# Patient Record
Sex: Female | Born: 1945
Health system: Southern US, Community
[De-identification: ages and names within clinical notes are randomized; demographics above are authoritative.]

## PROBLEM LIST (undated history)

## (undated) DIAGNOSIS — E785 Hyperlipidemia, unspecified: Secondary | ICD-10-CM

## (undated) HISTORY — PX: BREAST SURGERY: SHX581

## (undated) HISTORY — PX: DILITATION & CURRETTAGE/HYSTROSCOPY WITH ESSURE: SHX5573

---

## 2016-10-06 DIAGNOSIS — Z780 Asymptomatic menopausal state: Secondary | ICD-10-CM | POA: Diagnosis not present

## 2016-10-06 DIAGNOSIS — R635 Abnormal weight gain: Secondary | ICD-10-CM | POA: Diagnosis not present

## 2016-10-06 DIAGNOSIS — E7800 Pure hypercholesterolemia, unspecified: Secondary | ICD-10-CM | POA: Diagnosis not present

## 2016-11-09 ENCOUNTER — Other Ambulatory Visit (HOSPITAL_COMMUNITY): Payer: Self-pay | Admitting: Internal Medicine

## 2016-11-09 DIAGNOSIS — N951 Menopausal and female climacteric states: Secondary | ICD-10-CM

## 2016-11-16 DIAGNOSIS — Z780 Asymptomatic menopausal state: Secondary | ICD-10-CM | POA: Diagnosis not present

## 2016-11-16 DIAGNOSIS — R635 Abnormal weight gain: Secondary | ICD-10-CM | POA: Diagnosis not present

## 2016-11-16 DIAGNOSIS — E7800 Pure hypercholesterolemia, unspecified: Secondary | ICD-10-CM | POA: Diagnosis not present

## 2016-11-16 DIAGNOSIS — Z0000 Encounter for general adult medical examination without abnormal findings: Secondary | ICD-10-CM | POA: Diagnosis not present

## 2016-11-22 ENCOUNTER — Ambulatory Visit (HOSPITAL_COMMUNITY)
Admission: RE | Admit: 2016-11-22 | Discharge: 2016-11-22 | Disposition: A | Discharge status: Home / Self Care | Payer: PPO | Source: Ambulatory Visit | Attending: Internal Medicine | Admitting: Internal Medicine

## 2016-11-22 ENCOUNTER — Encounter (HOSPITAL_COMMUNITY): Payer: Self-pay | Admitting: Radiology

## 2016-11-22 DIAGNOSIS — M8588 Other specified disorders of bone density and structure, other site: Secondary | ICD-10-CM | POA: Diagnosis not present

## 2016-11-22 DIAGNOSIS — E860 Dehydration: Secondary | ICD-10-CM | POA: Diagnosis not present

## 2016-11-22 DIAGNOSIS — M8589 Other specified disorders of bone density and structure, multiple sites: Secondary | ICD-10-CM | POA: Insufficient documentation

## 2016-11-22 DIAGNOSIS — R748 Abnormal levels of other serum enzymes: Secondary | ICD-10-CM | POA: Diagnosis not present

## 2016-11-22 DIAGNOSIS — E7800 Pure hypercholesterolemia, unspecified: Secondary | ICD-10-CM | POA: Diagnosis not present

## 2016-11-22 DIAGNOSIS — M85851 Other specified disorders of bone density and structure, right thigh: Secondary | ICD-10-CM | POA: Diagnosis not present

## 2016-11-22 DIAGNOSIS — N951 Menopausal and female climacteric states: Secondary | ICD-10-CM | POA: Diagnosis not present

## 2017-02-02 DIAGNOSIS — E7800 Pure hypercholesterolemia, unspecified: Secondary | ICD-10-CM | POA: Diagnosis not present

## 2017-02-02 DIAGNOSIS — R748 Abnormal levels of other serum enzymes: Secondary | ICD-10-CM | POA: Diagnosis not present

## 2017-02-02 DIAGNOSIS — M8588 Other specified disorders of bone density and structure, other site: Secondary | ICD-10-CM | POA: Diagnosis not present

## 2017-02-06 DIAGNOSIS — M8588 Other specified disorders of bone density and structure, other site: Secondary | ICD-10-CM | POA: Diagnosis not present

## 2017-02-06 DIAGNOSIS — D649 Anemia, unspecified: Secondary | ICD-10-CM | POA: Diagnosis not present

## 2017-02-06 DIAGNOSIS — E7800 Pure hypercholesterolemia, unspecified: Secondary | ICD-10-CM | POA: Diagnosis not present

## 2017-02-06 DIAGNOSIS — E860 Dehydration: Secondary | ICD-10-CM | POA: Diagnosis not present

## 2017-02-06 DIAGNOSIS — Z79899 Other long term (current) drug therapy: Secondary | ICD-10-CM | POA: Diagnosis not present

## 2017-08-21 DIAGNOSIS — R69 Illness, unspecified: Secondary | ICD-10-CM | POA: Diagnosis not present

## 2017-11-08 DIAGNOSIS — E7800 Pure hypercholesterolemia, unspecified: Secondary | ICD-10-CM | POA: Diagnosis not present

## 2017-12-17 DIAGNOSIS — R748 Abnormal levels of other serum enzymes: Secondary | ICD-10-CM | POA: Diagnosis not present

## 2017-12-17 DIAGNOSIS — R635 Abnormal weight gain: Secondary | ICD-10-CM | POA: Diagnosis not present

## 2017-12-17 DIAGNOSIS — Z0001 Encounter for general adult medical examination with abnormal findings: Secondary | ICD-10-CM | POA: Diagnosis not present

## 2017-12-17 DIAGNOSIS — E7800 Pure hypercholesterolemia, unspecified: Secondary | ICD-10-CM | POA: Diagnosis not present

## 2017-12-17 DIAGNOSIS — Z780 Asymptomatic menopausal state: Secondary | ICD-10-CM | POA: Diagnosis not present

## 2017-12-17 DIAGNOSIS — D649 Anemia, unspecified: Secondary | ICD-10-CM | POA: Diagnosis not present

## 2017-12-20 DIAGNOSIS — Z6827 Body mass index (BMI) 27.0-27.9, adult: Secondary | ICD-10-CM | POA: Diagnosis not present

## 2017-12-20 DIAGNOSIS — D649 Anemia, unspecified: Secondary | ICD-10-CM | POA: Diagnosis not present

## 2017-12-20 DIAGNOSIS — E663 Overweight: Secondary | ICD-10-CM | POA: Diagnosis not present

## 2017-12-20 DIAGNOSIS — Z79899 Other long term (current) drug therapy: Secondary | ICD-10-CM | POA: Diagnosis not present

## 2017-12-20 DIAGNOSIS — E7800 Pure hypercholesterolemia, unspecified: Secondary | ICD-10-CM | POA: Diagnosis not present

## 2017-12-20 DIAGNOSIS — Z0000 Encounter for general adult medical examination without abnormal findings: Secondary | ICD-10-CM | POA: Diagnosis not present

## 2017-12-28 ENCOUNTER — Encounter (HOSPITAL_COMMUNITY): Payer: Self-pay | Admitting: Emergency Medicine

## 2017-12-28 ENCOUNTER — Emergency Department (HOSPITAL_COMMUNITY): Payer: Medicare HMO

## 2017-12-28 ENCOUNTER — Observation Stay (HOSPITAL_COMMUNITY)
Admission: EM | Admit: 2017-12-28 | Discharge: 2017-12-29 | Disposition: A | Discharge status: Home / Self Care | Payer: Medicare HMO | Attending: Family Medicine | Admitting: Family Medicine

## 2017-12-28 ENCOUNTER — Other Ambulatory Visit: Payer: Self-pay

## 2017-12-28 DIAGNOSIS — R55 Syncope and collapse: Secondary | ICD-10-CM | POA: Diagnosis not present

## 2017-12-28 DIAGNOSIS — R531 Weakness: Secondary | ICD-10-CM | POA: Insufficient documentation

## 2017-12-28 DIAGNOSIS — R319 Hematuria, unspecified: Secondary | ICD-10-CM

## 2017-12-28 DIAGNOSIS — R112 Nausea with vomiting, unspecified: Secondary | ICD-10-CM | POA: Diagnosis not present

## 2017-12-28 DIAGNOSIS — R69 Illness, unspecified: Secondary | ICD-10-CM | POA: Diagnosis not present

## 2017-12-28 DIAGNOSIS — N3000 Acute cystitis without hematuria: Secondary | ICD-10-CM | POA: Diagnosis not present

## 2017-12-28 DIAGNOSIS — E860 Dehydration: Secondary | ICD-10-CM | POA: Diagnosis present

## 2017-12-28 DIAGNOSIS — E785 Hyperlipidemia, unspecified: Secondary | ICD-10-CM

## 2017-12-28 DIAGNOSIS — N390 Urinary tract infection, site not specified: Secondary | ICD-10-CM | POA: Insufficient documentation

## 2017-12-28 DIAGNOSIS — R42 Dizziness and giddiness: Secondary | ICD-10-CM | POA: Diagnosis not present

## 2017-12-28 HISTORY — DX: Hyperlipidemia, unspecified: E78.5

## 2017-12-28 LAB — URINALYSIS, ROUTINE W REFLEX MICROSCOPIC
BILIRUBIN URINE: NEGATIVE
Glucose, UA: NEGATIVE mg/dL
Hgb urine dipstick: NEGATIVE
Ketones, ur: NEGATIVE mg/dL
NITRITE: NEGATIVE
PH: 6 (ref 5.0–8.0)
Protein, ur: 30 mg/dL — AB
SPECIFIC GRAVITY, URINE: 1.012 (ref 1.005–1.030)

## 2017-12-28 LAB — CBC WITH DIFFERENTIAL/PLATELET
BASOS PCT: 0 %
Basophils Absolute: 0 10*3/uL (ref 0.0–0.1)
EOS ABS: 0.2 10*3/uL (ref 0.0–0.7)
Eosinophils Relative: 2 %
HCT: 44 % (ref 36.0–46.0)
HEMOGLOBIN: 14.5 g/dL (ref 12.0–15.0)
LYMPHS ABS: 2.5 10*3/uL (ref 0.7–4.0)
LYMPHS PCT: 31 %
MCH: 32.4 pg (ref 26.0–34.0)
MCHC: 33 g/dL (ref 30.0–36.0)
MCV: 98.4 fL (ref 78.0–100.0)
Monocytes Absolute: 0.7 10*3/uL (ref 0.1–1.0)
Monocytes Relative: 9 %
NEUTROS ABS: 4.7 10*3/uL (ref 1.7–7.7)
NEUTROS PCT: 58 %
Platelets: 402 10*3/uL — ABNORMAL HIGH (ref 150–400)
RBC: 4.47 MIL/uL (ref 3.87–5.11)
RDW: 11.8 % (ref 11.5–15.5)
WBC: 8 10*3/uL (ref 4.0–10.5)

## 2017-12-28 LAB — COMPREHENSIVE METABOLIC PANEL
ALBUMIN: 4.1 g/dL (ref 3.5–5.0)
ALK PHOS: 71 U/L (ref 38–126)
ALT: 20 U/L (ref 0–44)
ANION GAP: 10 (ref 5–15)
AST: 19 U/L (ref 15–41)
BUN: 17 mg/dL (ref 8–23)
CALCIUM: 9.4 mg/dL (ref 8.9–10.3)
CO2: 25 mmol/L (ref 22–32)
Chloride: 106 mmol/L (ref 98–111)
Creatinine, Ser: 0.71 mg/dL (ref 0.44–1.00)
GFR calc Af Amer: >60 mL/min (ref >60)
GFR calc non Af Amer: >60 mL/min (ref >60)
GLUCOSE: 108 mg/dL — AB (ref 70–99)
POTASSIUM: 3.6 mmol/L (ref 3.5–5.1)
SODIUM: 141 mmol/L (ref 135–145)
Total Bilirubin: 0.5 mg/dL (ref 0.3–1.2)
Total Protein: 7.6 g/dL (ref 6.5–8.1)

## 2017-12-28 LAB — HEMOGLOBIN A1C
HEMOGLOBIN A1C: 5.5 % (ref 4.8–5.6)
Mean Plasma Glucose: 111.15 mg/dL

## 2017-12-28 LAB — TROPONIN I: Troponin I: <0.03 ng/mL (ref <0.03)

## 2017-12-28 MED ORDER — ONDANSETRON HCL 4 MG/2ML IJ SOLN: 4.0000 mg | Freq: Four times a day (QID) | INTRAMUSCULAR | Status: DC | PRN

## 2017-12-28 MED ORDER — SENNOSIDES-DOCUSATE SODIUM 8.6-50 MG PO TABS: 1.0000 | ORAL_TABLET | Freq: Every evening | ORAL | Status: DC | PRN

## 2017-12-28 MED ORDER — LORAZEPAM 2 MG/ML IJ SOLN
0.5000 mg | Freq: Once | INTRAMUSCULAR | Status: AC
  Administered 2017-12-28: 0.5 mg via INTRAVENOUS
  Filled 2017-12-28: qty 1

## 2017-12-28 MED ORDER — ACETAMINOPHEN 325 MG PO TABS: 650.0000 mg | ORAL_TABLET | Freq: Four times a day (QID) | ORAL | Status: DC | PRN

## 2017-12-28 MED ORDER — ONDANSETRON 4 MG PO TBDP
4.0000 mg | ORAL_TABLET | Freq: Once | ORAL | Status: AC
  Administered 2017-12-28: 4 mg via ORAL

## 2017-12-28 MED ORDER — MECLIZINE HCL 12.5 MG PO TABS: 25.0000 mg | ORAL_TABLET | Freq: Three times a day (TID) | ORAL | Status: DC

## 2017-12-28 MED ORDER — SODIUM CHLORIDE 0.9 % IV SOLN
1.0000 g | INTRAVENOUS | Status: DC
  Administered 2017-12-29: 1 g via INTRAVENOUS
  Filled 2017-12-28 (×2): qty 10
  Filled 2017-12-28: qty 1

## 2017-12-28 MED ORDER — ENOXAPARIN SODIUM 40 MG/0.4ML ~~LOC~~ SOLN
40.0000 mg | SUBCUTANEOUS | Status: DC
  Filled 2017-12-28 (×2): qty 0.4

## 2017-12-28 MED ORDER — SODIUM CHLORIDE 0.9 % IV BOLUS
500.0000 mL | Freq: Once | INTRAVENOUS | Status: AC
Start: 2017-12-28 — End: 2017-12-28
  Administered 2017-12-28: 500 mL via INTRAVENOUS

## 2017-12-28 MED ORDER — MECLIZINE HCL 12.5 MG PO TABS: 25.0000 mg | ORAL_TABLET | Freq: Three times a day (TID) | ORAL | Status: DC | PRN

## 2017-12-28 MED ORDER — ONDANSETRON 4 MG PO TBDP
4.0000 mg | ORAL_TABLET | Freq: Once | ORAL | Status: AC
  Administered 2017-12-28: 4 mg via ORAL
  Filled 2017-12-28: qty 1

## 2017-12-28 MED ORDER — ONDANSETRON HCL 4 MG PO TABS: 4.0000 mg | ORAL_TABLET | Freq: Four times a day (QID) | ORAL | Status: DC | PRN

## 2017-12-28 MED ORDER — ACETAMINOPHEN 650 MG RE SUPP
650.0000 mg | Freq: Four times a day (QID) | RECTAL | Status: DC | PRN
Start: 2017-12-28 — End: 2017-12-29

## 2017-12-28 MED ORDER — SODIUM CHLORIDE 0.9 % IV SOLN
1.0000 g | Freq: Once | INTRAVENOUS | Status: AC
  Administered 2017-12-28: 1 g via INTRAVENOUS
  Filled 2017-12-28: qty 10

## 2017-12-28 MED ORDER — MECLIZINE HCL 12.5 MG PO TABS
25.0000 mg | ORAL_TABLET | Freq: Once | ORAL | Status: AC
  Administered 2017-12-28: 25 mg via ORAL
  Filled 2017-12-28: qty 2

## 2017-12-28 MED ORDER — POTASSIUM CHLORIDE IN NACL 20-0.9 MEQ/L-% IV SOLN
INTRAVENOUS | Status: DC
  Administered 2017-12-28 – 2017-12-29 (×2): via INTRAVENOUS

## 2017-12-28 MED ORDER — TRAZODONE HCL 50 MG PO TABS: 50.0000 mg | ORAL_TABLET | Freq: Every evening | ORAL | Status: DC | PRN

## 2017-12-28 MED ORDER — ONDANSETRON 4 MG PO TBDP
ORAL_TABLET | ORAL | Status: AC
  Filled 2017-12-28: qty 1

## 2017-12-28 NOTE — ED Triage Notes (Signed)
Pt c/o dizziness that started this am. Pt states everything is spinning.

## 2017-12-28 NOTE — Evaluation (Addendum)
Physical Therapy Evaluation Patient Details Name: Stephanie Ramos MRN: [id number] DOB: [birth year] Today's Date: 12/28/2017   History of Present Illness  Stephanie Ramos is a 72 y.o. female patient states about 1 AM she was in bed saying her prayers and all of a sudden she felt funny like she might pass out.  She felt like things were spinning and she felt like she was falling.  She states she saw double vision.  She states she had her husband get her some salt that she took and then she put her head between her legs and states that lasted a few minutes and resolved.  She denies any headache.  She states about 4 AM she got up to go to the bathroom but again felt the spinning sensation.  She has had more frequent urination recently.  She says that she has been drinking a lot of water as she does this regularly.  She again put her head between her legs and she was finally able to walk to the bathroom without a spinning sensation or feeling she is falling.  She states the second episode only lasted a few seconds.  She states she feels weak all over.  She states her vision is normal now.  She denies headache, nausea, vomiting, chest pain, or feeling like she is going to pass out.  Her husband states her speech was normal and she did not have a facial droop during these episodes.  She states she is never had this happen before.  Patient is right-handed.    Clinical Impression  Patient functioning near baseline for functional mobility and gait other than c/o dizziness especially when lying down.  Patient positive for down beating torsional nystagmus when put in right Hallpike-Dix maneuver.  Patient treated with Canalith Repositioning Maneuver (CRM) and states intensity of dizziness decreased afterwards.  In order to assess the effectiveness of the CRM, in AM, RN informed to ask attending MD to hold meclizine for rest of day and over night.  Plan:  Will reassess patient in AM for dizziness and  recommendations below.    Follow Up Recommendations Outpatient PT(follow up for vestibular rehab)    Equipment Recommendations       Recommendations for Other Services       Precautions / Restrictions Precautions Precautions: None Restrictions Weight Bearing Restrictions: No      Mobility  Bed Mobility Overal bed mobility: Modified Independent                Transfers Overall transfer level: Modified independent               General transfer comment: increased time  Ambulation/Gait Ambulation/Gait assistance: Modified independent (Device/Increase time) Gait Distance (Feet): 250 Feet Assistive device: None Gait Pattern/deviations: WFL(Within Functional Limits) Gait velocity: decreased   General Gait Details: grossly WFL except slower than normal cadence, able to ambulate on level, inclined, declined surfaces without loss of balance  Stairs            Wheelchair Mobility    Modified Rankin (Stroke Patients Only)       Balance Overall balance assessment: No apparent balance deficits (not formally assessed)                                           Pertinent Vitals/Pain Pain Assessment: No/denies pain    Home Living Family/patient expects to  be discharged to:: Private residence Living Arrangements: Spouse/significant other Available Help at Discharge: Family Type of Home: Mobile home Home Access: Stairs to enter Entrance Stairs-Rails: None Entrance Stairs-Number of Steps: 2 Home Layout: One level Home Equipment: Environmental consultantWalker - 2 wheels;Cane - single point;Wheelchair - manual;Shower seat;Bedside commode      Prior Function Level of Independence: Independent         Comments: community ambulator, drives     Higher education careers adviserHand Dominance        Extremity/Trunk Assessment   Upper Extremity Assessment Upper Extremity Assessment: Overall WFL for tasks assessed    Lower Extremity Assessment Lower Extremity Assessment: Overall  WFL for tasks assessed    Cervical / Trunk Assessment Cervical / Trunk Assessment: Normal  Communication   Communication: No difficulties  Cognition Arousal/Alertness: Awake/alert Behavior During Therapy: WFL for tasks assessed/performed Overall Cognitive Status: Within Functional Limits for tasks assessed                                        General Comments      Exercises     Assessment/Plan    PT Assessment Patient needs continued PT services  PT Problem List Decreased mobility;Other (comment)(down beating torsional nystagmus noted with right hallpike-dix manuver)       PT Treatment Interventions  Manual techniques;Other (comment)(treat patient with canulith repositioning maneuver (Epley's maneuver))    PT Goals (Current goals can be found in the Care Plan section)  Acute Rehab PT Goals Patient Stated Goal: return home without dizziness PT Goal Formulation: With patient/family Time For Goal Achievement: 12/29/17 Potential to Achieve Goals: Good    Frequency Min 1X/week   Barriers to discharge        Co-evaluation               AM-PAC PT "6 Clicks" Daily Activity  Outcome Measure Difficulty turning over in bed (including adjusting bedclothes, sheets and blankets)?: None Difficulty moving from lying on back to sitting on the side of the bed? : None Difficulty sitting down on and standing up from a chair with arms (e.g., wheelchair, bedside commode, etc,.)?: None Help needed moving to and from a bed to chair (including a wheelchair)?: None Help needed walking in hospital room?: None Help needed climbing 3-5 steps with a railing? : None 6 Click Score: 24    End of Session   Activity Tolerance: Patient tolerated treatment well Patient left: in bed;with family/visitor present;with call bell/phone within reach Nurse Communication: Mobility status;Other (comment)(RN informed to call MD hold meclizine for proper assessment of vertigo) PT  Visit Diagnosis: Unsteadiness on feet (R26.81);Other abnormalities of gait and mobility (R26.89);Muscle weakness (generalized) (M62.81)    Time: [id number] PT Time Calculation (min) (ACUTE ONLY): 37 min   Charges:   PT Evaluation $PT Eval Moderate Complexity: 1 Mod PT Treatments $Canalith Rep Proc: 8-22 mins        3:35 PM, 12/28/17 Ocie BobJames Lenell Mcconnell, MPT Physical Therapist with [facility] 336 [phone] office [phone] mobile phone

## 2017-12-28 NOTE — Progress Notes (Signed)
Patient has arrived to room 301. Alert and oriented times four,[facility]. Vital signs stable. MD notified that patient has arrived to floor. Will continue to monitor patient.

## 2017-12-28 NOTE — Care Management Obs Status (Signed)
MEDICARE OBSERVATION STATUS NOTIFICATION   Patient Details  Name: Stephanie Ramos MRN: [id number] Date of Birth: May 06, 1946   Medicare Observation Status Notification Given:  Yes    [facility] [employer], Chrystine OilerSharley Diane, RN 12/28/2017, 1:10 PM

## 2017-12-28 NOTE — H&P (Signed)
History and Physical  ERIAL FIKES ZOX:[id number] DOB: [birth year] DOA: 12/28/2017  Referring physician: Lynelle Doctor PCP: Pearson Grippe, MD   Chief Complaint: dizziness  HPI: Stephanie Ramos is a 72 y.o. female patient states about 1 AM she was in bed saying her prayers and all of a sudden she felt funny like she might pass out.  She felt like things were spinning and she felt like she was falling.  She states she saw double vision.  She states she had her husband get her some salt that she took and then she put her head between her legs and states that lasted a few minutes and resolved.  She denies any headache.  She states about 4 AM she got up to go to the bathroom but again felt the spinning sensation.  She has had more frequent urination recently.  She says that she has been drinking a lot of water as she does this regularly.  She again put her head between her legs and she was finally able to walk to the bathroom without a spinning sensation or feeling she is falling.  She states the second episode only lasted a few seconds.  She states she feels weak all over.  She states her vision is normal now.  She denies headache, nausea, vomiting, chest pain, or feeling like she is going to pass out.  Her husband states her speech was normal and she did not have a facial droop during these episodes.  She states she is never had this happen before.  Patient is right-handed.  ED Course:  Pt was noted to have a UTI.  Pt was started on meclizine for dizziness with only minimal improvement.  Pt had an MRI brain with no acute findings.  Pt received IV fluids.  Despite ED treatments, patient remained dizzy and sluggish and admission was requested for observation and management.    Review of Systems: All systems reviewed and apart from history of presenting illness, are negative.  Past Medical History:  Diagnosis Date  . Hyperlipemia    Past Surgical History:  Procedure Laterality Date  . BREAST SURGERY     . DILITATION & CURRETTAGE/HYSTROSCOPY WITH ESSURE     Social History:  reports that she has never smoked. She has never used smokeless tobacco. She reports that she does not drink alcohol or use drugs.  Allergies  Allergen Reactions  . Sulfa Antibiotics Rash    History reviewed. No pertinent family history.  Prior to Admission medications   Medication Sig Start Date End Date Taking? Authorizing Provider  simvastatin (ZOCOR) 20 MG tablet Take 20 mg by mouth daily.   Yes [provider]   Physical Exam: Vitals:   12/28/17 0527 12/28/17 0530 12/28/17 0847 12/28/17 0900  BP: (!) 163/94 (!) 154/72 140/64 124/61  Pulse: 89 92 85 70  Resp: 19  18 16   Temp: 97.6 F (36.4 C)   (!) 97.5 F (36.4 C)  TempSrc: Oral   Oral  SpO2: 100% 96% 96% 99%  Weight:      Height:         General exam: Moderately built and nourished patient, lying comfortably supine on the gurney in no obvious distress.  Head, eyes and ENT: Nontraumatic and normocephalic. Pupils equally reacting to light and accommodation. Oral mucosa dry.   Neck: Supple. No JVD, carotid bruit or thyromegaly.  Lymphatics: No lymphadenopathy.  Respiratory system: Clear to auscultation. No increased work of breathing.  Cardiovascular system: S1  and S2 heard, RRR. No JVD, murmurs, gallops, clicks or pedal edema.  Gastrointestinal system: Abdomen is nondistended, soft and nontender. Normal bowel sounds heard. No organomegaly or masses appreciated.  Central nervous system: Alert and oriented. No focal neurological deficits.  Extremities: Symmetric 5 x 5 power. Peripheral pulses symmetrically felt.   Skin: No rashes or acute findings.  Musculoskeletal system: Negative exam.  Psychiatry: Pleasant and cooperative.  Labs on Admission:  Basic Metabolic Panel: Recent Labs  Lab 12/28/17 0550  NA 141  K 3.6  CL 106  CO2 25  GLUCOSE 108*  BUN 17  CREATININE 0.71  CALCIUM 9.4   Liver Function Tests: Recent  Labs  Lab 12/28/17 0550  AST 19  ALT 20  ALKPHOS 71  BILITOT 0.5  PROT 7.6  ALBUMIN 4.1   No results for input(s): LIPASE, AMYLASE in the last 168 hours. No results for input(s): AMMONIA in the last 168 hours. CBC: Recent Labs  Lab 12/28/17 0550  WBC 8.0  NEUTROABS 4.7  HGB 14.5  HCT 44.0  MCV 98.4  PLT 402*   Cardiac Enzymes: Recent Labs  Lab 12/28/17 0550  TROPONINI <0.03    BNP (last 3 results) No results for input(s): PROBNP in the last 8760 hours. CBG: No results for input(s): GLUCAP in the last 168 hours.  Radiological Exams on Admission: Ct Head Wo Contrast  Result Date: 12/28/2017 CLINICAL DATA:  Dizziness EXAM: CT HEAD WITHOUT CONTRAST TECHNIQUE: Contiguous axial images were obtained from the base of the skull through the vertex without intravenous contrast. COMPARISON:  None. FINDINGS: Brain: There is no mass, hemorrhage or extra-axial collection. The size and configuration of the ventricles and extra-axial CSF spaces are normal. There is no acute or chronic infarction. The brain parenchyma is normal. Vascular: No abnormal hyperdensity of the major intracranial arteries or dural venous sinuses. No intracranial atherosclerosis. Skull: The visualized skull base, calvarium and extracranial soft tissues are normal. Sinuses/Orbits: No fluid levels or advanced mucosal thickening of the visualized paranasal sinuses. No mastoid or middle ear effusion. The orbits are normal. IMPRESSION: Normal head CT. Electronically Signed   By: Deatra Robinson M.D.   On: 12/28/2017 05:51   Mr Brain Wo Contrast  Result Date: 12/28/2017 CLINICAL DATA:  Acute onset vertigo. Dizziness nausea vomiting near syncope EXAM: MRI HEAD WITHOUT CONTRAST TECHNIQUE: Multiplanar, multiecho pulse sequences of the brain and surrounding structures were obtained without intravenous contrast. COMPARISON:  CT head 12/28/2017 FINDINGS: Brain: Negative for acute infarct. Scattered small periventricular and deep  white matter hyperintensities bilaterally. Brainstem and basal ganglia normal. Negative for hemorrhage or mass. No fluid collection or midline shift. Vascular: Normal arterial flow void Skull and upper cervical spine: Negative Sinuses/Orbits: Negative Other: None IMPRESSION: No acute intracranial abnormality. Mild chronic microvascular ischemic changes in the white matter. Electronically Signed   By: Marlan Palau M.D.   On: 12/28/2017 08:25    Assessment/Plan Principal Problem:   Vertigo Active Problems:   Dizziness   Dehydration   Hyperlipemia   UTI (urinary tract infection)  1. UTI - Pt has symptoms of dysuria associated with abnormal urinalysis.  Treating with Ceftriaxone pending culture and sensitivity results.  Supportive care ordered.  2. Vertigo / dizziness - Pt is mildly dehydrated and has a UTI.  Pt being treated with hydration, meclizine. Consult PT for evaluation for vestibular rehab training.  3. Dehydration - Gentle IV fluids ordered.  4. Hyperlipidemia - stable.    DVT Prophylaxis: lovenox  Code Status: Full  Family Communication: bedside   Disposition Plan: home when medically stable   Time spent: 55 minutes  Standley Dakinslanford Josilynn Losh, MD [facility] Pager [phone]  If 7PM-7AM, please contact night-coverage [email or web address] Password [email or web address] 12/28/2017, 9:58 AM

## 2017-12-28 NOTE — ED Provider Notes (Signed)
[facility] EMERGENCY DEPARTMENT Provider Note   CSN: [id number] Arrival date & time: 12/28/17  0456  Time seen 05:10 AM   History   Chief Complaint Chief Complaint  Patient presents with  . Dizziness    HPI Stephanie Ramos is a 72 y.o. female.  HPI patient states about 1 AM she was in bed saying her prayers and all of a sudden she felt funny like she might pass out.  She later on told me during that time she felt like things were spinning and she felt like she was falling.  She states she saw double.  She states she had her husband get her some salt that she looked and then she put her head between her legs and states that lasted a few minutes and resolved.  She denies any headache.  She states about 4 AM she got up to go to the bathroom but again felt the spinning sensation.  She again put her head between her legs and she was finally able to walk to the bathroom without a spinning sensation or feeling she is falling.  She states the second episode only lasted a few seconds.  When I asked her how she feels now she states "I do not feel uppidy yet".  I asked her what that meant.  She states she feels weak all over.  She states her vision is normal now.  She denies headache, nausea, vomiting, chest pain, or feeling like she is going to pass out.  Her husband states her speech was normal and she did not have a facial droop during these episodes.  She states she is never had this happen before.  Patient is right-handed.  Patient states her mother had a stroke and also had a "mild heart condition".  PCP Pearson Grippe, MD   Past Medical History:  Diagnosis Date  . Hyperlipemia     There are no active problems to display for this patient.   Past Surgical History:  Procedure Laterality Date  . BREAST SURGERY    . DILITATION & CURRETTAGE/HYSTROSCOPY WITH ESSURE       OB History   None      Home Medications    Prior to Admission medications   Medication Sig Start Date End  Date Taking? Authorizing Provider  simvastatin (ZOCOR) 20 MG tablet Take 20 mg by mouth daily.   Yes [provider]    Family History No family history on file.  Social History Social History   Tobacco Use  . Smoking status: Never Smoker  . Smokeless tobacco: Never Used  Substance Use Topics  . Alcohol use: Never    Frequency: Never  . Drug use: Never  lives at home Lives with spouse   Allergies   Sulfa antibiotics   Review of Systems Review of Systems  All other systems reviewed and are negative.    Physical Exam Updated Vital Signs BP (!) 154/72   Pulse 92   Temp 97.6 F (36.4 C) (Oral)   Resp 19   Ht 5\' 1"  (1.549 m)   Wt 71.7 kg (158 lb)   SpO2 96%   BMI 29.85 kg/m   Vital signs normal except for hypertension   Physical Exam  Constitutional: She is oriented to person, place, and time. She appears well-developed and well-nourished.  Non-toxic appearance. She does not appear ill. No distress.  HENT:  Head: Normocephalic and atraumatic.  Right Ear: External ear normal.  Left Ear: External ear normal.  Nose: Nose normal. No mucosal edema or rhinorrhea.  Mouth/Throat: Oropharynx is clear and moist and mucous membranes are normal. No dental abscesses or uvula swelling.  Eyes: Pupils are equal, round, and reactive to light. Conjunctivae and EOM are normal. Right eye exhibits no nystagmus. Left eye exhibits no nystagmus.  Neck: Normal range of motion and full passive range of motion without pain. Neck supple.  Cardiovascular: Normal rate, regular rhythm and normal heart sounds. Exam reveals no gallop and no friction rub.  No murmur heard. Pulmonary/Chest: Effort normal and breath sounds normal. No respiratory distress. She has no wheezes. She has no rhonchi. She has no rales. She exhibits no tenderness and no crepitus.  Abdominal: Soft. Normal appearance and bowel sounds are normal. She exhibits no distension. There is no tenderness. There is no  rebound and no guarding.  Musculoskeletal: Normal range of motion. She exhibits no edema or tenderness.  Moves all extremities well.   Neurological: She is alert and oriented to person, place, and time. She has normal strength. No cranial nerve deficit.  Grips are equal, there is no pronator drift, there is no motor weakness in her extremities.  Skin: Skin is warm, dry and intact. No rash noted. No erythema. No pallor.  Psychiatric: She has a normal mood and affect. Her speech is normal and behavior is normal. Her mood appears not anxious.  Nursing note and vitals reviewed.    ED Treatments / Results  Labs (all labs ordered are listed, but only abnormal results are displayed) Results for orders placed or performed during the hospital encounter of 12/28/17  Comprehensive metabolic panel  Result Value Ref Range   Sodium 141 135 - 145 mmol/L   Potassium 3.6 3.5 - 5.1 mmol/L   Chloride 106 98 - 111 mmol/L   CO2 25 22 - 32 mmol/L   Glucose, Bld 108 (H) 70 - 99 mg/dL   BUN 17 8 - 23 mg/dL   Creatinine, Ser 1.61 0.44 - 1.00 mg/dL   Calcium 9.4 8.9 - 09.6 mg/dL   Total Protein 7.6 6.5 - 8.1 g/dL   Albumin 4.1 3.5 - 5.0 g/dL   AST 19 15 - 41 U/L   ALT 20 0 - 44 U/L   Alkaline Phosphatase 71 38 - 126 U/L   Total Bilirubin 0.5 0.3 - 1.2 mg/dL   GFR calc non Af Amer >60 >60 mL/min   GFR calc Af Amer >60 >60 mL/min   Anion gap 10 5 - 15  Troponin I  Result Value Ref Range   Troponin I <0.03 <0.03 ng/mL  CBC with Differential  Result Value Ref Range   WBC 8.0 4.0 - 10.5 K/uL   RBC 4.47 3.87 - 5.11 MIL/uL   Hemoglobin 14.5 12.0 - 15.0 g/dL   HCT 04.5 40.9 - 81.1 %   MCV 98.4 78.0 - 100.0 fL   MCH 32.4 26.0 - 34.0 pg   MCHC 33.0 30.0 - 36.0 g/dL   RDW 91.4 78.2 - 95.6 %   Platelets 402 (H) 150 - 400 K/uL   Neutrophils Relative % 58 %   Neutro Abs 4.7 1.7 - 7.7 K/uL   Lymphocytes Relative 31 %   Lymphs Abs 2.5 0.7 - 4.0 K/uL   Monocytes Relative 9 %   Monocytes Absolute 0.7 0.1 -  1.0 K/uL   Eosinophils Relative 2 %   Eosinophils Absolute 0.2 0.0 - 0.7 K/uL   Basophils Relative 0 %   Basophils Absolute 0.0 0.0 -  0.1 K/uL  Urinalysis, Routine w reflex microscopic  Result Value Ref Range   Color, Urine YELLOW YELLOW   APPearance CLOUDY (A) CLEAR   Specific Gravity, Urine 1.012 1.005 - 1.030   pH 6.0 5.0 - 8.0   Glucose, UA NEGATIVE NEGATIVE mg/dL   Hgb urine dipstick NEGATIVE NEGATIVE   Bilirubin Urine NEGATIVE NEGATIVE   Ketones, ur NEGATIVE NEGATIVE mg/dL   Protein, ur 30 (A) NEGATIVE mg/dL   Nitrite NEGATIVE NEGATIVE   Leukocytes, UA LARGE (A) NEGATIVE   RBC / HPF 11-20 0 - 5 RBC/hpf   WBC, UA >50 (H) 0 - 5 WBC/hpf   Bacteria, UA MANY (A) NONE SEEN   Squamous Epithelial / LPF 0-5 0 - 5   WBC Clumps PRESENT    Mucus PRESENT    Budding Yeast PRESENT      Laboratory interpretation all normal except possible UTI     EKG EKG Interpretation  Date/Time:  Friday December 28 2017 05:32:02 EDT Ventricular Rate:  89 PR Interval:    QRS Duration: 87 QT Interval:  392 QTC Calculation: 477 R Axis:   67 Text Interpretation:  Sinus rhythm Abnormal R-wave progression, early transition Electrode noise No old tracing to compare Confirmed by Devoria Albe ([id number]) on 12/28/2017 5:44:04 AM   Radiology Ct Head Wo Contrast  Result Date: 12/28/2017 CLINICAL DATA:  Dizziness EXAM: CT HEAD WITHOUT CONTRAST TECHNIQUE: Contiguous axial images were obtained from the base of the skull through the vertex without intravenous contrast. COMPARISON:  None. FINDINGS: Brain: There is no mass, hemorrhage or extra-axial collection. The size and configuration of the ventricles and extra-axial CSF spaces are normal. There is no acute or chronic infarction. The brain parenchyma is normal. Vascular: No abnormal hyperdensity of the major intracranial arteries or dural venous sinuses. No intracranial atherosclerosis. Skull: The visualized skull base, calvarium and extracranial soft tissues are  normal. Sinuses/Orbits: No fluid levels or advanced mucosal thickening of the visualized paranasal sinuses. No mastoid or middle ear effusion. The orbits are normal. IMPRESSION: Normal head CT. Electronically Signed   By: Deatra Robinson M.D.   On: 12/28/2017 05:51   Mr Brain Wo Contrast  Result Date: 12/28/2017 CLINICAL DATA:  Acute onset vertigo. Dizziness nausea vomiting near syncope EXAM: MRI HEAD WITHOUT CONTRAST TECHNIQUE: Multiplanar, multiecho pulse sequences of the brain and surrounding structures were obtained without intravenous contrast. COMPARISON:  CT head 12/28/2017 FINDINGS: Brain: Negative for acute infarct. Scattered small periventricular and deep white matter hyperintensities bilaterally. Brainstem and basal ganglia normal. Negative for hemorrhage or mass. No fluid collection or midline shift. Vascular: Normal arterial flow void Skull and upper cervical spine: Negative Sinuses/Orbits: Negative Other: None IMPRESSION: No acute intracranial abnormality. Mild chronic microvascular ischemic changes in the white matter. Electronically Signed   By: Marlan Palau M.D.   On: 12/28/2017 08:25    Procedures .Critical Care Performed by: Devoria Albe, MD Authorized by: Devoria Albe, MD   Critical care provider statement:    Critical care time (minutes):  31   Critical care was necessary to treat or prevent imminent or life-threatening deterioration of the following conditions:  CNS failure or compromise   Critical care was time spent personally by me on the following activities:  Discussions with primary provider, evaluation of patient's response to treatment, examination of patient, obtaining history from patient or surrogate, ordering and review of laboratory studies, ordering and review of radiographic studies and re-evaluation of patient's condition   (including critical care time)  Medications  Ordered in ED Medications  ondansetron (ZOFRAN-ODT) disintegrating tablet 4 mg (has no  administration in time range)  cefTRIAXone (ROCEPHIN) 1 g in sodium chloride 0.9 % 100 mL IVPB (1 g Intravenous New Bag/Given 12/28/17 0830)  sodium chloride 0.9 % bolus 500 mL (500 mLs Intravenous New Bag/Given 12/28/17 0830)  LORazepam (ATIVAN) injection 0.5 mg (has no administration in time range)  meclizine (ANTIVERT) tablet 25 mg (25 mg Oral Given 12/28/17 0647)  ondansetron (ZOFRAN-ODT) disintegrating tablet 4 mg (4 mg Oral Given 12/28/17 [id number])     Initial Impression / Assessment and Plan / ED Course  I have reviewed the triage vital signs and the nursing notes.  Pertinent labs & imaging results that were available during my care of the patient were reviewed by me and considered in my medical decision making (see chart for details).     06:40 AM recheck patient to give CT results, states she feels "the same", when asked what that means states she has had 2 more episodes, while in CT. Doesn't have it now. Given meclizine and antivert. Will have her get MRI of brain this morning. States she feels sluggish now.   7:25 AM MRI called states patient was vomiting and they were unable to do her MRI.  Nursing staff took over Zofran 4 mg ODT.  This was the first time that she complained or had vomiting that I am aware of.  Patient's urine was suspicious for UTI, urine culture was sent.  Her nitrates were negative.  She was given Rocephin 1 g IV.  8:30 AM patient was informed her MRI was good without evidence of a stroke.  She states when she was an MRI she had another spinning episode.  She had received meclizine before that.  Her IV was being started and we discussed that she had a urinary tract infection and she states her  urination has been off this week.  At this point I think patient might need to be admitted overnight for IV antibiotics and get better control of her vertigo.  08:51 AM Dr Maryln Manuel Johnson, hospitalist, will admit.    Final Clinical Impressions(s) / ED Diagnoses   Final diagnoses:   Dizziness  Vertigo  Nausea and vomiting, intractability of vomiting not specified, unspecified vomiting type  Urinary tract infection with hematuria, site unspecified    Plan admission  Devoria AlbeIva Trinten Boudoin, MD, Concha PyoFACEP    Hason Ofarrell, MD 12/28/17 [id number]

## 2017-12-28 NOTE — Plan of Care (Signed)
  Problem: Acute Rehab PT Goals(only PT should resolve) Goal: Pt/caregiver will Perform Home Exercise Program Outcome: Progressing Flowsheets (Taken 12/28/2017 1537) Pt/caregiver will Perform Home Exercise Program: For improved balance;Independently   3:38 PM, 12/28/17 Ocie BobJames Sharita Bienaime, MPT Physical Therapist [facility] [phone] office [phone] mobile phone

## 2017-12-28 NOTE — Care Management Note (Signed)
Case Management Note  Patient Details  Name: Stephanie Ramos MRN: [id number] Date of Birth: Aug 24, 1945  Subjective/Objective: Dizziness . UTI. From home with husband. Independent. No assistive devices. Still drives. Has PCP-[facility].                 Action/Plan: PT eval pending. Discussed with patient home health PT and vestibular rehab. She declines. CM will follow.   Expected Discharge Date:  12/29/17               Expected Discharge Plan:  Home w Home Health Services  In-House Referral:     Discharge planning Services  CM Consult  Post Acute Care Choice:  Home Health Choice offered to:  Patient  DME Arranged:    DME Agency:     HH Arranged:  Patient Refused HH HH Agency:     Status of Service:  Completed, signed off  If discussed at [employer] Length of Stay Meetings, dates discussed:    Additional Comments:  Ruddy Swire, Chrystine OilerSharley Diane, RN 12/28/2017, 1:11 PM

## 2017-12-29 DIAGNOSIS — R42 Dizziness and giddiness: Secondary | ICD-10-CM | POA: Diagnosis not present

## 2017-12-29 DIAGNOSIS — E785 Hyperlipidemia, unspecified: Secondary | ICD-10-CM | POA: Diagnosis not present

## 2017-12-29 DIAGNOSIS — N3000 Acute cystitis without hematuria: Secondary | ICD-10-CM | POA: Diagnosis not present

## 2017-12-29 DIAGNOSIS — E860 Dehydration: Secondary | ICD-10-CM | POA: Diagnosis not present

## 2017-12-29 LAB — BASIC METABOLIC PANEL
Anion gap: 7 (ref 5–15)
BUN: 14 mg/dL (ref 8–23)
CALCIUM: 8.9 mg/dL (ref 8.9–10.3)
CO2: 26 mmol/L (ref 22–32)
Chloride: 111 mmol/L (ref 98–111)
Creatinine, Ser: 0.7 mg/dL (ref 0.44–1.00)
GFR calc Af Amer: >60 mL/min (ref >60)
Glucose, Bld: 102 mg/dL — ABNORMAL HIGH (ref 70–99)
POTASSIUM: 4.4 mmol/L (ref 3.5–5.1)
SODIUM: 144 mmol/L (ref 135–145)

## 2017-12-29 LAB — CBC
HEMATOCRIT: 44 % (ref 36.0–46.0)
Hemoglobin: 14.1 g/dL (ref 12.0–15.0)
MCH: 32.7 pg (ref 26.0–34.0)
MCHC: 32 g/dL (ref 30.0–36.0)
MCV: 102.1 fL — ABNORMAL HIGH (ref 78.0–100.0)
PLATELETS: 390 10*3/uL (ref 150–400)
RBC: 4.31 MIL/uL (ref 3.87–5.11)
RDW: 12 % (ref 11.5–15.5)
WBC: 6.8 10*3/uL (ref 4.0–10.5)

## 2017-12-29 LAB — MAGNESIUM: MAGNESIUM: 2.3 mg/dL (ref 1.7–2.4)

## 2017-12-29 MED ORDER — CEPHALEXIN 500 MG PO CAPS: 500.0000 mg | ORAL_CAPSULE | Freq: Three times a day (TID) | ORAL | 0 refills | Status: AC

## 2017-12-29 MED ORDER — MECLIZINE HCL 12.5 MG PO TABS: 12.5000 mg | ORAL_TABLET | Freq: Three times a day (TID) | ORAL | 0 refills | Status: DC | PRN

## 2017-12-29 NOTE — Discharge Summary (Signed)
Physician Discharge Summary  Stephanie Ramos ZOX:[id number] DOB: 03/23/1946 DOA: 12/28/2017  PCP: Pearson Grippe, MD  Admit date: 12/28/2017 Discharge date: 12/29/2017  Admitted From: Home  Disposition: Home   Recommendations for Outpatient Follow-up:  1. Follow up with PCP in 1 weeks 2. Please obtain BMP/CBC in one week 3. Arrange outpatient PT for vestibular rehab 4. Please follow up on the following pending results:  Final culture data  Discharge Condition: STABLE   CODE STATUS: FULL    Brief Hospitalization Summary: Please see all hospital notes, images, labs for full details of the hospitalization.  HPI: Stephanie Ramos is a 72 y.o. female patient states about 1 AM she was in bed saying her prayers and all of a sudden she felt funny like she might pass out. She felt like things were spinning and she felt like she was falling. She states she saw double vision. She states she had her husband get her some salt that she took and then she put her head between her legs and states that lasted a few minutes and resolved. She denies any headache. She states about 4 AM she got up to go to the bathroom but again felt the spinning sensation. She has had more frequent urination recently.  She says that she has been drinking a lot of water as she does this regularly.  She again put her head between her legs and she was finally able to walk to the bathroom without a spinning sensation or feeling she is falling. She states the second episode only lasted a few seconds. She states she feels weak all over. She states her vision is normal now. She denies headache, nausea, vomiting, chest pain, or feeling like she is going to pass out. Her husband states her speech was normal and she did not have a facial droop during these episodes. She states she is never had this happen before. Patient is right-handed.  ED Course:  Pt was noted to have a UTI.  Pt was started on meclizine for dizziness with  only minimal improvement.  Pt had an MRI brain with no acute findings.  Pt received IV fluids.  Despite ED treatments, patient remained dizzy and sluggish and admission was requested for observation and management.    1. UTI - Pt has symptoms of dysuria associated with abnormal urinalysis.  Treated with Ceftriaxone with improvement in symptoms and will discharge on cephalexin 500 mg TID x 3 more days.   Supportive care with IV fluids given with good results. Pt to follow up with PCP for final urine culture results.  2. Vertigo / dizziness - Pt is mildly dehydrated and has a UTI.  Pt being treated with hydration, meclizine. Consulted PT for evaluation for vestibular rehab training and they recommended outpatient vestibular rehab training which patient will arrange with her PCP.  3. Dehydration - Gentle IV fluids ordered and patient feeling much better.  4. Hyperlipidemia - stable.    DVT Prophylaxis: lovenox  Code Status: Full   Family Communication: bedside   Disposition Plan: home  Discharge Diagnoses:  Principal Problem:   Vertigo Active Problems:   Dizziness   Dehydration   Hyperlipemia   UTI (urinary tract infection)  Discharge Instructions: Discharge Instructions    Call MD for:  difficulty breathing, headache or visual disturbances   Complete by:  As directed    Call MD for:  extreme fatigue   Complete by:  As directed    Call MD for:  persistant  dizziness or light-headedness   Complete by:  As directed    Call MD for:  persistant nausea and vomiting   Complete by:  As directed    Call MD for:  severe uncontrolled pain   Complete by:  As directed    Call MD for:  temperature >100.4   Complete by:  As directed    Increase activity slowly   Complete by:  As directed      Allergies as of 12/29/2017      Reactions   Sulfa Antibiotics Rash      Medication List    TAKE these medications   CALCIUM+D3 600-800 MG-UNIT Tabs Generic drug:  Calcium Carb-Cholecalciferol Take  2 tablets by mouth every morning.   cephALEXin 500 MG capsule Commonly known as:  KEFLEX Take 1 capsule (500 mg total) by mouth 3 (three) times daily for 3 days.   ferrous sulfate 325 (65 FE) MG tablet Take 325 mg by mouth every morning.   meclizine 12.5 MG tablet Commonly known as:  ANTIVERT Take 1-2 tablets (12.5-25 mg total) by mouth 3 (three) times daily as needed for dizziness.   multivitamin with minerals Tabs tablet Take 1 tablet by mouth every morning.   simvastatin 40 MG tablet Commonly known as:  ZOCOR TAKE 1 TABLET BY MOUTH ONCE DAILY FOR CHOLESTEROL   VITAMIN A PO Take 1 tablet by mouth every morning.   VITAMIN B-COMPLEX PO Take 1 capsule by mouth every morning.   vitamin C 500 MG tablet Commonly known as:  ASCORBIC ACID Take 500 mg by mouth every morning.   VITAMIN E PO Take 1 tablet by mouth every morning.      Follow-up Information    Pearson Grippe, MD. Schedule an appointment as soon as possible for a visit in 1 week(s).   Specialty:  Internal Medicine Contact information: [address] [place] [address] [phone]          Allergies  Allergen Reactions  . Sulfa Antibiotics Rash   Allergies as of 12/29/2017      Reactions   Sulfa Antibiotics Rash      Medication List    TAKE these medications   CALCIUM+D3 600-800 MG-UNIT Tabs Generic drug:  Calcium Carb-Cholecalciferol Take 2 tablets by mouth every morning.   cephALEXin 500 MG capsule Commonly known as:  KEFLEX Take 1 capsule (500 mg total) by mouth 3 (three) times daily for 3 days.   ferrous sulfate 325 (65 FE) MG tablet Take 325 mg by mouth every morning.   meclizine 12.5 MG tablet Commonly known as:  ANTIVERT Take 1-2 tablets (12.5-25 mg total) by mouth 3 (three) times daily as needed for dizziness.   multivitamin with minerals Tabs tablet Take 1 tablet by mouth every morning.   simvastatin 40 MG tablet Commonly known as:  ZOCOR TAKE 1 TABLET BY MOUTH  ONCE DAILY FOR CHOLESTEROL   VITAMIN A PO Take 1 tablet by mouth every morning.   VITAMIN B-COMPLEX PO Take 1 capsule by mouth every morning.   vitamin C 500 MG tablet Commonly known as:  ASCORBIC ACID Take 500 mg by mouth every morning.   VITAMIN E PO Take 1 tablet by mouth every morning.       Procedures/Studies: Ct Head Wo Contrast  Result Date: 12/28/2017 CLINICAL DATA:  Dizziness EXAM: CT HEAD WITHOUT CONTRAST TECHNIQUE: Contiguous axial images were obtained from the base of the skull through the vertex without intravenous contrast. COMPARISON:  None. FINDINGS:  Brain: There is no mass, hemorrhage or extra-axial collection. The size and configuration of the ventricles and extra-axial CSF spaces are normal. There is no acute or chronic infarction. The brain parenchyma is normal. Vascular: No abnormal hyperdensity of the major intracranial arteries or dural venous sinuses. No intracranial atherosclerosis. Skull: The visualized skull base, calvarium and extracranial soft tissues are normal. Sinuses/Orbits: No fluid levels or advanced mucosal thickening of the visualized paranasal sinuses. No mastoid or middle ear effusion. The orbits are normal. IMPRESSION: Normal head CT. Electronically Signed   By: Deatra RobinsonKevin  Herman M.D.   On: 12/28/2017 05:51   Mr Brain Wo Contrast  Result Date: 12/28/2017 CLINICAL DATA:  Acute onset vertigo. Dizziness nausea vomiting near syncope EXAM: MRI HEAD WITHOUT CONTRAST TECHNIQUE: Multiplanar, multiecho pulse sequences of the brain and surrounding structures were obtained without intravenous contrast. COMPARISON:  CT head 12/28/2017 FINDINGS: Brain: Negative for acute infarct. Scattered small periventricular and deep white matter hyperintensities bilaterally. Brainstem and basal ganglia normal. Negative for hemorrhage or mass. No fluid collection or midline shift. Vascular: Normal arterial flow void Skull and upper cervical spine: Negative Sinuses/Orbits: Negative  Other: None IMPRESSION: No acute intracranial abnormality. Mild chronic microvascular ischemic changes in the white matter. Electronically Signed   By: Marlan Palauharles  Clark M.D.   On: 12/28/2017 08:25      Subjective: Pt says she feels much better, she has been ambulating in room and no dizziness.    Discharge Exam: Vitals:   12/29/17 0003 12/29/17 0415  BP: 122/67 118/79  Pulse: 75 78  Resp: 16 17  Temp: 97.6 F (36.4 C) 97.7 F (36.5 C)  SpO2: 96% 100%   Vitals:   12/28/17 2001 12/28/17 2023 12/29/17 0003 12/29/17 0415  BP:  131/65 122/67 118/79  Pulse:  77 75 78  Resp:  16 16 17   Temp:  97.8 F (36.6 C) 97.6 F (36.4 C) 97.7 F (36.5 C)  TempSrc:  Oral Oral Oral  SpO2: 94% 97% 96% 100%  Weight:      Height:       General: Pt is alert, awake, not in acute distress, no nystagmus Cardiovascular: RRR, S1/S2 +, no rubs, no gallops Respiratory: CTA bilaterally, no wheezing, no rhonchi Abdominal: Soft, NT, ND, bowel sounds + Extremities: no edema, no cyanosis   The results of significant diagnostics from this hospitalization (including imaging, microbiology, ancillary and laboratory) are listed below for reference.     Microbiology: No results found for this or any previous visit (from the past 240 hour(s)).   Labs: BNP (last 3 results) No results for input(s): BNP in the last 8760 hours. Basic Metabolic Panel: Recent Labs  Lab 12/28/17 0550 12/29/17 0602  NA 141 144  K 3.6 4.4  CL 106 111  CO2 25 26  GLUCOSE 108* 102*  BUN 17 14  CREATININE 0.71 0.70  CALCIUM 9.4 8.9  MG  --  2.3   Liver Function Tests: Recent Labs  Lab 12/28/17 0550  AST 19  ALT 20  ALKPHOS 71  BILITOT 0.5  PROT 7.6  ALBUMIN 4.1   No results for input(s): LIPASE, AMYLASE in the last 168 hours. No results for input(s): AMMONIA in the last 168 hours. CBC: Recent Labs  Lab 12/28/17 0550 12/29/17 0602  WBC 8.0 6.8  NEUTROABS 4.7  --   HGB 14.5 14.1  HCT 44.0 44.0  MCV 98.4  102.1*  PLT 402* 390   Cardiac Enzymes: Recent Labs  Lab 12/28/17 0550  TROPONINI <  0.03   BNP: Invalid input(s): POCBNP CBG: No results for input(s): GLUCAP in the last 168 hours. D-Dimer No results for input(s): DDIMER in the last 72 hours. Hgb A1c Recent Labs    12/28/17 0550  HGBA1C 5.5   Lipid Profile No results for input(s): CHOL, HDL, LDLCALC, TRIG, CHOLHDL, LDLDIRECT in the last 72 hours. Thyroid function studies No results for input(s): TSH, T4TOTAL, T3FREE, THYROIDAB in the last 72 hours.  Invalid input(s): FREET3 Anemia work up No results for input(s): VITAMINB12, FOLATE, FERRITIN, TIBC, IRON, RETICCTPCT in the last 72 hours. Urinalysis    Component Value Date/Time   COLORURINE YELLOW 12/28/2017 0523   APPEARANCEUR CLOUDY (A) 12/28/2017 0523   LABSPEC 1.012 12/28/2017 0523   PHURINE 6.0 12/28/2017 0523   GLUCOSEU NEGATIVE 12/28/2017 0523   HGBUR NEGATIVE 12/28/2017 0523   BILIRUBINUR NEGATIVE 12/28/2017 0523   KETONESUR NEGATIVE 12/28/2017 0523   PROTEINUR 30 (A) 12/28/2017 0523   NITRITE NEGATIVE 12/28/2017 0523   LEUKOCYTESUR LARGE (A) 12/28/2017 0523   Sepsis Labs Invalid input(s): PROCALCITONIN,  WBC,  LACTICIDVEN Microbiology No results found for this or any previous visit (from the past 240 hour(s)).  Time coordinating discharge:   SIGNED:  Standley Dakins, MD  [facility] 12/29/2017, 10:56 AM Pager [phone]  If 7PM-7AM, please contact night-coverage [email or web address] Password [email or web address]

## 2017-12-29 NOTE — Progress Notes (Signed)
Patient discharged home.  IV removed - WNL.  Reviewed AVS and medications.   Instructed to call PCP Monday for 1 week follow up.  Educated on importance of abx completion and proper pericare to prevent UTI.  Patient verbalizes understanding, assisted off unit in NAD.

## 2017-12-29 NOTE — Discharge Instructions (Signed)
Please see your primary care physician to make arrangements for outpatient physical therapy for vestibular rehab training if your dizziness continues.   Please seek medical care or return to ER if symptoms recur, worsen or new problems develop.   Follow with Primary MD  Pearson Grippe, MD  and other consultant's as instructed your Hospitalist MD  Please get a complete blood count and chemistry panel checked by your Primary MD at your next visit, and again as instructed by your Primary MD.  Get Medicines reviewed and adjusted: Please take all your medications with you for your next visit with your Primary MD  Laboratory/radiological data: Please request your Primary MD to go over all hospital tests and procedure/radiological results at the follow up, please ask your Primary MD to get all Hospital records sent to his/her office.  In some cases, they will be blood work, cultures and biopsy results pending at the time of your discharge. Please request that your primary care M.D. follows up on these results.  Also Note the following: If you experience worsening of your admission symptoms, develop shortness of breath, life threatening emergency, suicidal or homicidal thoughts you must seek medical attention immediately by calling 911 or calling your MD immediately  if symptoms less severe.  You must read complete instructions/literature along with all the possible adverse reactions/side effects for all the Medicines you take and that have been prescribed to you. Take any new Medicines after you have completely understood and accpet all the possible adverse reactions/side effects.   Do not drive when taking Pain medications or sleeping medications (Benzodaizepines)  Do not take more than prescribed Pain, Sleep and Anxiety Medications. It is not advisable to combine anxiety,sleep and pain medications without talking with your primary care practitioner  Special Instructions: If you have smoked or chewed  Tobacco  in the last 2 yrs please stop smoking, stop any regular Alcohol  and or any Recreational drug use.  Wear Seat belts while driving.  Please note: You were cared for by a hospitalist during your hospital stay. Once you are discharged, your primary care physician will handle any further medical issues. Please note that NO REFILLS for any discharge medications will be authorized once you are discharged, as it is imperative that you return to your primary care physician (or establish a relationship with a primary care physician if you do not have one) for your post hospital discharge needs so that they can reassess your need for medications and monitor your lab values.      Antibiotic Medicine, Adult Antibiotic medicines treat infections caused by a type of germ called bacteria. They work by killing the bacteria that make you sick. When do I need to take antibiotics? You often need these medicines to treat bacterial infections, such as:  A urinary tract infection (UTI).  Strep throat.  Meningitis. This affects the spinal cord and brain.  A bad lung infection.  You may start the medicines while your doctor waits for tests to come back. When the tests come back, your doctor may change or stop your medicine. When are antibiotics not needed? You do not need these medicines for most common illnesses, such as:  A cold.  The flu.  A sore throat.  Antibiotics are not always needed for all infections caused by bacteria. Do not ask for these medicines, or take them, when they are not needed. What are the risks of taking antibiotics? Most antibiotics can cause an infection called Clostridium difficile.This causes watery  poop (diarrhea). Let your doctor know right away if:  You have watery poop while taking an antibiotic.  You have watery poop after you stop taking an antibiotic. The illness can happen weeks after you stop the medicine.  You also have a risk of getting an infection  in the future that antibiotics cannot treat (antibiotic-resistant infection). This type of infection can be dangerous. What else should I know about taking antibiotics?  You need to take the entire prescription. ? Take the medicine for as long as told by your doctor. ? Do not stop taking it even if you start to feel better.  Try not to miss any doses. If you miss a dose, call your doctor.  Birth control pills may not work. If you take birth control pills: ? Keep on taking them. ? Use a second form of birth control, such as a condom. Do this for as long as told by your doctor.  Ask your doctor: ? How long to wait in between doses. ? If you should take the medicine with food. ? If there is anything you should stay away from while taking the antibiotic, such as: ? Food. ? Drinks. ? Medicines. ? If there are any side effects you should watch for.  Only take the medicines that your doctor told you to take. Do not take medicines that were given to someone else.  Drink a large glass of water with the medicine.  Ask the pharmacist for a tool to measure the medicine, such as: ? A syringe. ? A cup. ? A spoon.  Throw away any extra medicine. Contact a doctor if:  You get worse.  You have new joint pain or muscle aches after starting the medicine.  You have side effects from the medicine, such as: ? Stomach pain. ? Watery poop. ? Feeling sick to your stomach (nausea). Get help right away if:  You have signs of a very bad allergic reaction. If this happens, stop taking the medicine right away. Signs may include: ? Hives. These are raised, itchy, red bumps on the skin. ? Skin rash. ? Trouble breathing. ? Wheezing. ? Swelling. ? Feeling dizzy. ? Throwing up (vomiting).  Your pee (urine) is dark, or is the color of blood.  Your skin turns yellow.  You bruise easily.  You bleed easily.  You have very bad watery poop and cramps in your belly.  You have a very bad  headache. Summary  Antibiotics are often used to treat infections caused by bacteria.  Only take these medicines when needed.  Let your doctor know if you have watery poop while taking an antibiotic.  You need to take the entire prescription. This information is not intended to replace advice given to you by your health care provider. Make sure you discuss any questions you have with your health care provider. Document Released: 02/29/2008 Document Revised: 05/24/2016 Document Reviewed: 05/24/2016 Elsevier Interactive Patient Education  2017 [employer] Inc[employer]     Benign Positional Vertigo Vertigo is the feeling that you or your surroundings are moving when they are not. Benign positional vertigo is the most common form of vertigo. The cause of this condition is not serious (is benign). This condition is triggered by certain movements and positions (is positional). This condition can be dangerous if it occurs while you are doing something that could endanger you or others, such as driving. What are the causes? In many cases, the cause of this condition is not known. It may be  caused by a disturbance in an area of the inner ear that helps your brain to sense movement and balance. This disturbance can be caused by a viral infection (labyrinthitis), head injury, or repetitive motion. What increases the risk? This condition is more likely to develop in:  Women.  People who are 87 years of age or older.  What are the signs or symptoms? Symptoms of this condition usually happen when you move your head or your eyes in different directions. Symptoms may start suddenly, and they usually last for less than a minute. Symptoms may include:  Loss of balance and falling.  Feeling like you are spinning or moving.  Feeling like your surroundings are spinning or moving.  Nausea and vomiting.  Blurred vision.  Dizziness.  Involuntary eye movement (nystagmus).  Symptoms can be mild and cause  only slight annoyance, or they can be severe and interfere with daily life. Episodes of benign positional vertigo may return (recur) over time, and they may be triggered by certain movements. Symptoms may improve over time. How is this diagnosed? This condition is usually diagnosed by medical history and a physical exam of the head, neck, and ears. You may be referred to a health care provider who specializes in ear, nose, and throat (ENT) problems (otolaryngologist) or a provider who specializes in disorders of the nervous system (neurologist). You may have additional testing, including:  MRI.  A CT scan.  Eye movement tests. Your health care provider may ask you to change positions quickly while he or she watches you for symptoms of benign positional vertigo, such as nystagmus. Eye movement may be tested with an electronystagmogram (ENG), caloric stimulation, the Dix-Hallpike test, or the roll test.  An electroencephalogram (EEG). This records electrical activity in your brain.  Hearing tests.  How is this treated? Usually, your health care provider will treat this by moving your head in specific positions to adjust your inner ear back to normal. Surgery may be needed in severe cases, but this is rare. In some cases, benign positional vertigo may resolve on its own in 2-4 weeks. Follow these instructions at home: Safety  Move slowly.Avoid sudden body or head movements.  Avoid driving.  Avoid operating heavy machinery.  Avoid doing any tasks that would be dangerous to you or others if a vertigo episode would occur.  If you have trouble walking or keeping your balance, try using a cane for stability. If you feel dizzy or unstable, sit down right away.  Return to your normal activities as told by your health care provider. Ask your health care provider what activities are safe for you. General instructions  Take over-the-counter and prescription medicines only as told by your health  care provider.  Avoid certain positions or movements as told by your health care provider.  Drink enough fluid to keep your urine clear or pale yellow.  Keep all follow-up visits as told by your health care provider. This is important. Contact a health care provider if:  You have a fever.  Your condition gets worse or you develop new symptoms.  Your family or friends notice any behavioral changes.  Your nausea or vomiting gets worse.  You have numbness or a pins and needles sensation. Get help right away if:  You have difficulty speaking or moving.  You are always dizzy.  You faint.  You develop severe headaches.  You have weakness in your legs or arms.  You have changes in your hearing or vision.  You  develop a stiff neck.  You develop sensitivity to light. This information is not intended to replace advice given to you by your health care provider. Make sure you discuss any questions you have with your health care provider. Document Released: 02/27/2006 Document Revised: 10/28/2015 Document Reviewed: 09/14/2014 Elsevier Interactive Patient Education  2018 [employer].  Dizziness Dizziness is a common problem. It makes you feel unsteady or light-headed. You may feel like you are about to pass out (faint). Dizziness can lead to getting hurt if you stumble or fall. Dizziness can be caused by many things, including:  Medicines.  Not having enough water in your body (dehydration).  Illness.  Follow these instructions at home: Eating and drinking  Drink enough fluid to keep your pee (urine) clear or pale yellow. This helps to keep you from getting dehydrated. Try to drink more clear fluids, such as water.  Do not drink alcohol.  Limit how much caffeine you drink or eat, if your doctor tells you to do that.  Limit how much salt (sodium) you drink or eat, if your doctor tells you to do that. Activity  Avoid making quick movements. ? When you stand up from  sitting in a chair, steady yourself until you feel okay. ? In the morning, first sit up on the side of the bed. When you feel okay, stand slowly while you hold onto something. Do this until you know that your balance is fine.  If you need to stand in one place for a long time, move your legs often. Tighten and relax the muscles in your legs while you are standing.  Do not drive or use heavy machinery if you feel dizzy.  Avoid bending down if you feel dizzy. Place items in your home so you can reach them easily without leaning over. Lifestyle  Do not use any products that contain nicotine or tobacco, such as cigarettes and e-cigarettes. If you need help quitting, ask your doctor.  Try to lower your stress level. You can do this by using methods such as yoga or meditation. Talk with your doctor if you need help. General instructions  Watch your dizziness for any changes.  Take over-the-counter and prescription medicines only as told by your doctor. Talk with your doctor if you think that you are dizzy because of a medicine that you are taking.  Tell a friend or a family member that you are feeling dizzy. If he or she notices any changes in your behavior, have this person call your doctor.  Keep all follow-up visits as told by your doctor. This is important. Contact a doctor if:  Your dizziness does not go away.  Your dizziness or light-headedness gets worse.  You feel sick to your stomach (nauseous).  You have trouble hearing.  You have new symptoms.  You are unsteady on your feet.  You feel like the room is spinning. Get help right away if:  You throw up (vomit) or have watery poop (diarrhea), and you cannot eat or drink anything.  You have trouble: ? Talking. ? Walking. ? Swallowing. ? Using your arms, hands, or legs.  You feel generally weak.  You are not thinking clearly, or you have trouble forming sentences. A friend or family member may notice this.  You  have: ? Chest pain. ? Pain in your belly (abdomen). ? Shortness of breath. ? Sweating.  Your vision changes.  You are bleeding.  You have a very bad headache.  You have neck pain  or a stiff neck.  You have a fever. These symptoms may be an emergency. Do not wait to see if the symptoms will go away. Get medical help right away. Call your local emergency services (911 in the [place] Do not drive yourself to the hospital. Summary  Dizziness makes you feel unsteady or light-headed. You may feel like you are about to pass out (faint).  Drink enough fluid to keep your pee (urine) clear or pale yellow. Do not drink alcohol.  Avoid making quick movements if you feel dizzy.  Watch your dizziness for any changes. This information is not intended to replace advice given to you by your health care provider. Make sure you discuss any questions you have with your health care provider. Document Released: 05/11/2011 Document Revised: 06/08/2016 Document Reviewed: 06/08/2016 Elsevier Interactive Patient Education  2017 [employer].   Fall Prevention in the Home Falls can cause injuries. They can happen to people of all ages. There are many things you can do to make your home safe and to help prevent falls. What can I do on the outside of my home?  Regularly fix the edges of walkways and driveways and fix any cracks.  Remove anything that might make you trip as you walk through a door, such as a raised step or threshold.  Trim any bushes or trees on the path to your home.  Use bright outdoor lighting.  Clear any walking paths of anything that might make someone trip, such as rocks or tools.  Regularly check to see if handrails are loose or broken. Make sure that both sides of any steps have handrails.  Any raised decks and porches should have guardrails on the edges.  Have any leaves, snow, or ice cleared regularly.  Use sand or salt on walking paths during winter.  Clean up any  spills in your garage right away. This includes oil or grease spills. What can I do in the bathroom?  Use night lights.  Install grab bars by the toilet and in the tub and shower. Do not use towel bars as grab bars.  Use non-skid mats or decals in the tub or shower.  If you need to sit down in the shower, use a plastic, non-slip stool.  Keep the floor dry. Clean up any water that spills on the floor as soon as it happens.  Remove soap buildup in the tub or shower regularly.  Attach bath mats securely with double-sided non-slip rug tape.  Do not have throw rugs and other things on the floor that can make you trip. What can I do in the bedroom?  Use night lights.  Make sure that you have a light by your bed that is easy to reach.  Do not use any sheets or blankets that are too big for your bed. They should not hang down onto the floor.  Have a firm chair that has side arms. You can use this for support while you get dressed.  Do not have throw rugs and other things on the floor that can make you trip. What can I do in the kitchen?  Clean up any spills right away.  Avoid walking on wet floors.  Keep items that you use a lot in easy-to-reach places.  If you need to reach something above you, use a strong step stool that has a grab bar.  Keep electrical cords out of the way.  Do not use floor polish or wax that makes floors slippery. If  you must use wax, use non-skid floor wax.  Do not have throw rugs and other things on the floor that can make you trip. What can I do with my stairs?  Do not leave any items on the stairs.  Make sure that there are handrails on both sides of the stairs and use them. Fix handrails that are broken or loose. Make sure that handrails are as long as the stairways.  Check any carpeting to make sure that it is firmly attached to the stairs. Fix any carpet that is loose or worn.  Avoid having throw rugs at the top or bottom of the stairs. If you  do have throw rugs, attach them to the floor with carpet tape.  Make sure that you have a light switch at the top of the stairs and the bottom of the stairs. If you do not have them, ask someone to add them for you. What else can I do to help prevent falls?  Wear shoes that: ? Do not have high heels. ? Have rubber bottoms. ? Are comfortable and fit you well. ? Are closed at the toe. Do not wear sandals.  If you use a stepladder: ? Make sure that it is fully opened. Do not climb a closed stepladder. ? Make sure that both sides of the stepladder are locked into place. ? Ask someone to hold it for you, if possible.  Clearly mark and make sure that you can see: ? Any grab bars or handrails. ? First and last steps. ? Where the edge of each step is.  Use tools that help you move around (mobility aids) if they are needed. These include: ? Canes. ? Walkers. ? Scooters. ? Crutches.  Turn on the lights when you go into a dark area. Replace any light bulbs as soon as they burn out.  Set up your furniture so you have a clear path. Avoid moving your furniture around.  If any of your floors are uneven, fix them.  If there are any pets around you, be aware of where they are.  Review your medicines with your doctor. Some medicines can make you feel dizzy. This can increase your chance of falling. Ask your doctor what other things that you can do to help prevent falls. This information is not intended to replace advice given to you by your health care provider. Make sure you discuss any questions you have with your health care provider. Document Released: 03/18/2009 Document Revised: 10/28/2015 Document Reviewed: 06/26/2014 Elsevier Interactive Patient Education  [employer]2018 Elsevier Inc.

## 2017-12-29 NOTE — Progress Notes (Addendum)
Physical Therapy Treatment Patient Details Name: Stephanie Ramos MRN: [id number] DOB: January 04, 1946 Today's Date: 12/29/2017    History of Present Illness Stephanie LECOMPTE is a 72 y.o. female patient states about 1 AM she was in bed saying her prayers and all of a sudden she felt funny like she might pass out.  She felt like things were spinning and she felt like she was falling.  She states she saw double vision.  She states she had her husband get her some salt that she took and then she put her head between her legs and states that lasted a few minutes and resolved.  She denies any headache.  She states about 4 AM she got up to go to the bathroom but again felt the spinning sensation.  She has had more frequent urination recently.  She says that she has been drinking a lot of water as she does this regularly.  She again put her head between her legs and she was finally able to walk to the bathroom without a spinning sensation or feeling she is falling.  She states the second episode only lasted a few seconds.  She states she feels weak all over.  She states her vision is normal now.  She denies headache, nausea, vomiting, chest pain, or feeling like she is going to pass out.  Her husband states her speech was normal and she did not have a facial droop during these episodes.  She states she is never had this happen before.  Patient is right-handed.    PT Comments    Patient present seated at bedside and state no c/o dizziness.  Patient instructed in self canalith repositioning procedure (CRP) for right side with fair/good return demonstrated and understanding acknowledged and given written instructions.  Patient put into right sided Hallpike-Dix maneuver and positive for mild up beating torsional nystagmus and c/o dizziness.  Patient treated with CRP and states no dizziness after treatment.  Patient instructed to only attempt self CRP when having episodes of vertigo, but any dizziness/vertigo like  symptoms associated with numbness and/or weakness to contact her PCP or go to hospital immediately.  Plan:  Patient to go home today and discharged from physical therapy to care of nursing.   Follow Up Recommendations  Outpatient PT(follow up for vestibular rehab)     Equipment Recommendations  None recommended by PT    Recommendations for Other Services       Precautions / Restrictions Precautions Precautions: None Restrictions Weight Bearing Restrictions: No    Mobility  Bed Mobility Overal bed mobility: Modified Independent                Transfers Overall transfer level: Modified independent                  Ambulation/Gait Ambulation/Gait assistance: Modified independent (Device/Increase time)               Stairs             Wheelchair Mobility    Modified Rankin (Stroke Patients Only)       Balance Overall balance assessment: No apparent balance deficits (not formally assessed)                                          Cognition Arousal/Alertness: Awake/alert Behavior During Therapy: WFL for tasks assessed/performed Overall Cognitive Status: Within Functional Limits  for tasks assessed                                        Exercises Other Exercises Other Exercises: instructed in self Epley maneuver (canalith repositioning procedure)    General Comments        Pertinent Vitals/Pain Pain Assessment: No/denies pain    Home Living                      Prior Function            PT Goals (current goals can now be found in the care plan section) Acute Rehab PT Goals Patient Stated Goal: return home without dizziness PT Goal Formulation: With patient/family Time For Goal Achievement: 12/29/17 Potential to Achieve Goals: Good Progress towards PT goals: Goals met/education completed, patient discharged from PT    Frequency    Min 1X/week      PT Plan Other  (comment)(Patient discharged to care of nursing)    Co-evaluation              AM-PAC PT "6 Clicks" Daily Activity  Outcome Measure  Difficulty turning over in bed (including adjusting bedclothes, sheets and blankets)?: None Difficulty moving from lying on back to sitting on the side of the bed? : None Difficulty sitting down on and standing up from a chair with arms (e.g., wheelchair, bedside commode, etc,.)?: None Help needed moving to and from a bed to chair (including a wheelchair)?: None Help needed walking in hospital room?: None Help needed climbing 3-5 steps with a railing? : None 6 Click Score: 24    End of Session   Activity Tolerance: Patient tolerated treatment well Patient left: in bed;with call bell/phone within reach(seated at bedside) Nurse Communication: Mobility status PT Visit Diagnosis: Unsteadiness on feet (R26.81);Other abnormalities of gait and mobility (R26.89);Muscle weakness (generalized) (M62.81)     Time: 5732-2025 PT Time Calculation (min) (ACUTE ONLY): 20 min  Charges:  $Canalith Rep Proc: 8-22 mins                     11:41 AM, 12/29/17 Lonell Grandchild, MPT Physical Therapist with [facility] [phone] office [phone] mobile phone

## 2017-12-30 LAB — URINE CULTURE: SPECIAL REQUESTS: NORMAL

## 2018-01-04 DIAGNOSIS — N390 Urinary tract infection, site not specified: Secondary | ICD-10-CM | POA: Diagnosis not present

## 2018-01-04 DIAGNOSIS — D649 Anemia, unspecified: Secondary | ICD-10-CM | POA: Diagnosis not present

## 2018-01-04 DIAGNOSIS — E860 Dehydration: Secondary | ICD-10-CM | POA: Diagnosis not present

## 2018-01-04 DIAGNOSIS — R42 Dizziness and giddiness: Secondary | ICD-10-CM | POA: Diagnosis not present

## 2018-02-26 DIAGNOSIS — R69 Illness, unspecified: Secondary | ICD-10-CM | POA: Diagnosis not present

## 2018-06-18 DIAGNOSIS — E7800 Pure hypercholesterolemia, unspecified: Secondary | ICD-10-CM | POA: Diagnosis not present

## 2018-06-18 DIAGNOSIS — Z79899 Other long term (current) drug therapy: Secondary | ICD-10-CM | POA: Diagnosis not present

## 2018-06-20 DIAGNOSIS — Z79899 Other long term (current) drug therapy: Secondary | ICD-10-CM | POA: Diagnosis not present

## 2018-06-20 DIAGNOSIS — E860 Dehydration: Secondary | ICD-10-CM | POA: Diagnosis not present

## 2018-06-20 DIAGNOSIS — E7800 Pure hypercholesterolemia, unspecified: Secondary | ICD-10-CM | POA: Diagnosis not present

## 2018-10-10 DIAGNOSIS — R350 Frequency of micturition: Secondary | ICD-10-CM | POA: Diagnosis not present

## 2018-10-10 DIAGNOSIS — R300 Dysuria: Secondary | ICD-10-CM | POA: Diagnosis not present

## 2018-10-10 DIAGNOSIS — N390 Urinary tract infection, site not specified: Secondary | ICD-10-CM | POA: Diagnosis not present

## 2018-12-19 DIAGNOSIS — R300 Dysuria: Secondary | ICD-10-CM | POA: Diagnosis not present

## 2018-12-19 DIAGNOSIS — R350 Frequency of micturition: Secondary | ICD-10-CM | POA: Diagnosis not present

## 2018-12-19 DIAGNOSIS — E7800 Pure hypercholesterolemia, unspecified: Secondary | ICD-10-CM | POA: Diagnosis not present

## 2018-12-19 DIAGNOSIS — R748 Abnormal levels of other serum enzymes: Secondary | ICD-10-CM | POA: Diagnosis not present

## 2018-12-19 DIAGNOSIS — Z79899 Other long term (current) drug therapy: Secondary | ICD-10-CM | POA: Diagnosis not present

## 2018-12-23 DIAGNOSIS — Z6828 Body mass index (BMI) 28.0-28.9, adult: Secondary | ICD-10-CM | POA: Diagnosis not present

## 2018-12-23 DIAGNOSIS — E663 Overweight: Secondary | ICD-10-CM | POA: Diagnosis not present

## 2018-12-23 DIAGNOSIS — Z0000 Encounter for general adult medical examination without abnormal findings: Secondary | ICD-10-CM | POA: Diagnosis not present

## 2018-12-23 DIAGNOSIS — Z79899 Other long term (current) drug therapy: Secondary | ICD-10-CM | POA: Diagnosis not present

## 2018-12-23 DIAGNOSIS — E7800 Pure hypercholesterolemia, unspecified: Secondary | ICD-10-CM | POA: Diagnosis not present

## 2019-03-11 DIAGNOSIS — R69 Illness, unspecified: Secondary | ICD-10-CM | POA: Diagnosis not present

## 2019-04-25 DIAGNOSIS — Z0100 Encounter for examination of eyes and vision without abnormal findings: Secondary | ICD-10-CM | POA: Diagnosis not present

## 2019-06-24 DIAGNOSIS — Z79899 Other long term (current) drug therapy: Secondary | ICD-10-CM | POA: Diagnosis not present

## 2019-06-24 DIAGNOSIS — E7800 Pure hypercholesterolemia, unspecified: Secondary | ICD-10-CM | POA: Diagnosis not present

## 2019-06-26 DIAGNOSIS — R42 Dizziness and giddiness: Secondary | ICD-10-CM | POA: Diagnosis not present

## 2019-06-26 DIAGNOSIS — E7800 Pure hypercholesterolemia, unspecified: Secondary | ICD-10-CM | POA: Diagnosis not present

## 2019-06-26 DIAGNOSIS — Z79899 Other long term (current) drug therapy: Secondary | ICD-10-CM | POA: Diagnosis not present

## 2019-07-21 DIAGNOSIS — H43813 Vitreous degeneration, bilateral: Secondary | ICD-10-CM | POA: Diagnosis not present

## 2019-07-21 DIAGNOSIS — H02834 Dermatochalasis of left upper eyelid: Secondary | ICD-10-CM | POA: Diagnosis not present

## 2019-07-21 DIAGNOSIS — H2513 Age-related nuclear cataract, bilateral: Secondary | ICD-10-CM | POA: Diagnosis not present

## 2019-07-21 DIAGNOSIS — H02831 Dermatochalasis of right upper eyelid: Secondary | ICD-10-CM | POA: Diagnosis not present

## 2019-07-30 NOTE — H&P (Signed)
Surgical History & Physical  Patient Name: Stephanie Ramos DOB: 30-Mar-1946  Surgery: Cataract extraction with intraocular lens implant phacoemulsification; Left Eye  Surgeon: [name] MD Surgery Date:  08/11/2019 Pre-Op Date:  07/21/2019  HPI: A 84 Yr. old female patient -referred by Dr. Steele Berg ([email or web address]) 1. The patient complains of difficulty when reading fine print, books, newspaper, instructions etc., which began 4 months ago. Both eyes are affected. The episode is constant and gradual. The patient describes foggy and hazy symptoms affecting their eyes/vision. Symptoms occur when the patient is reading. Patient feels she is not seeing well overall. Night driving not as comfortable due to glare with lights. Patient interested in surgery for BCVA.  Medical History: Dry Eyes Cataracts LDL  Review of Systems Negative Allergic/Immunologic Negative Cardiovascular Negative Constitutional Negative Ear, Nose, Mouth & Throat Negative Endocrine Negative Eyes Negative Gastrointestinal Negative Genitourinary Negative Hemotologic/Lymphatic Negative Integumentary Negative Musculoskeletal Negative Neurological Negative Psychiatry Negative Respiratory  Social   Never smoked   Medication Refresh artificial tears,  Simvastatin, CoQ10, Vitamin E, Vitamin C, Vitamin a, Calcium, Iron, Multivitamin,   Sx/Procedures Lumpectomy (Breast)-benign,   Drug Allergies  Sulfa,   History & Physical: Heent:  Cataract, Left eye NECK: supple without bruits LUNGS: lungs clear to auscultation CV: regular rate and rhythm Abdomen: soft and non-tender  Impression & Plan: Assessment: 1.  NUCLEAR SCLEROSIS AGE RELATED; Both Eyes (H25.13) 2.  VITREOUS DETACHMENT PVD; Both Eyes (H43.813) 3.  DERMATOCHALASIS, no surgery; Right Upper Lid, Left Upper Lid (H02.831, H02.834) 4.  ASTIGMATISM, REGULAR; Right Eye (H52.221)  Plan: 1.  Cataract accounts for the patient's decreased vision. This visual  impairment is not correctable with a tolerable change in glasses or contact lenses. Cataract surgery with an implantation of a new lens should significantly improve the visual and functional status of the patient. Discussed all risks, benefits, alternatives, and potential complications. Discussed the procedures and recovery. Patient desires to have surgery. A-scan ordered and performed today for intra-ocular lens calculations. The surgery will be performed in order to improve vision for driving, reading, and for eye examinations. Recommend phacoemulsification with intra-ocular lens. Left Eye. Dilates well - shugarcaine by protocol. 2.  Old Symptomatic. RD precautions given. Patient to call with increase in flashing lights/floaters/dark curtain. 3.  Asymptomatic, recommend observation for now. Findings, prognosis and treatment options reviewed. 4.  consider toric lens.

## 2019-08-04 DIAGNOSIS — H25812 Combined forms of age-related cataract, left eye: Secondary | ICD-10-CM | POA: Diagnosis not present

## 2019-08-07 ENCOUNTER — Other Ambulatory Visit (HOSPITAL_COMMUNITY)
Admission: RE | Admit: 2019-08-07 | Discharge: 2019-08-07 | Disposition: A | Discharge status: Home / Self Care | Payer: Medicare HMO | Source: Ambulatory Visit | Attending: Ophthalmology | Admitting: Ophthalmology

## 2019-08-07 ENCOUNTER — Encounter (HOSPITAL_COMMUNITY)
Admission: RE | Admit: 2019-08-07 | Discharge: 2019-08-07 | Disposition: A | Discharge status: Home / Self Care | Payer: Medicare HMO | Source: Ambulatory Visit | Attending: Ophthalmology | Admitting: Ophthalmology

## 2019-08-07 ENCOUNTER — Other Ambulatory Visit: Payer: Self-pay

## 2019-08-07 DIAGNOSIS — Z01812 Encounter for preprocedural laboratory examination: Secondary | ICD-10-CM | POA: Diagnosis not present

## 2019-08-07 DIAGNOSIS — Z20822 Contact with and (suspected) exposure to covid-19: Secondary | ICD-10-CM | POA: Insufficient documentation

## 2019-08-08 LAB — SARS CORONAVIRUS 2 (TAT 6-24 HRS): SARS Coronavirus 2: NEGATIVE

## 2019-08-11 ENCOUNTER — Ambulatory Visit (HOSPITAL_COMMUNITY)
Admission: RE | Admit: 2019-08-11 | Discharge: 2019-08-11 | Disposition: A | Discharge status: Home / Self Care | Payer: Medicare HMO | Attending: Ophthalmology | Admitting: Ophthalmology

## 2019-08-11 ENCOUNTER — Encounter (HOSPITAL_COMMUNITY): Payer: Self-pay | Admitting: Ophthalmology

## 2019-08-11 ENCOUNTER — Other Ambulatory Visit: Payer: Self-pay

## 2019-08-11 ENCOUNTER — Ambulatory Visit (HOSPITAL_COMMUNITY): Payer: Medicare HMO | Admitting: Anesthesiology

## 2019-08-11 ENCOUNTER — Encounter (HOSPITAL_COMMUNITY)
Admission: RE | Disposition: A | Discharge status: Home / Self Care | Payer: Self-pay | Source: Home / Self Care | Attending: Ophthalmology

## 2019-08-11 DIAGNOSIS — H52221 Regular astigmatism, right eye: Secondary | ICD-10-CM | POA: Diagnosis not present

## 2019-08-11 DIAGNOSIS — Z882 Allergy status to sulfonamides status: Secondary | ICD-10-CM | POA: Diagnosis not present

## 2019-08-11 DIAGNOSIS — H25812 Combined forms of age-related cataract, left eye: Secondary | ICD-10-CM | POA: Insufficient documentation

## 2019-08-11 DIAGNOSIS — Z79899 Other long term (current) drug therapy: Secondary | ICD-10-CM | POA: Insufficient documentation

## 2019-08-11 HISTORY — PX: CATARACT EXTRACTION W/PHACO: SHX586

## 2019-08-11 SURGERY — PHACOEMULSIFICATION, CATARACT, WITH IOL INSERTION
Anesthesia: Monitor Anesthesia Care | Site: Eye | Laterality: Left

## 2019-08-11 MED ORDER — TETRACAINE HCL 0.5 % OP SOLN
1.0000 [drp] | OPHTHALMIC | Status: AC | PRN
  Administered 2019-08-11 (×3): 1 [drp] via OPHTHALMIC

## 2019-08-11 MED ORDER — LACTATED RINGERS IV SOLN: INTRAVENOUS | Status: DC

## 2019-08-11 MED ORDER — CYCLOPENTOLATE-PHENYLEPHRINE 0.2-1 % OP SOLN
1.0000 [drp] | OPHTHALMIC | Status: AC | PRN
  Administered 2019-08-11 (×3): 1 [drp] via OPHTHALMIC

## 2019-08-11 MED ORDER — MIDAZOLAM HCL 2 MG/2ML IJ SOLN
INTRAMUSCULAR | Status: AC
  Filled 2019-08-11: qty 2

## 2019-08-11 MED ORDER — PROVISC 10 MG/ML IO SOLN
INTRAOCULAR | Status: DC | PRN
  Administered 2019-08-11: 0.85 mL via INTRAOCULAR

## 2019-08-11 MED ORDER — LIDOCAINE HCL 3.5 % OP GEL
1.0000 "application " | Freq: Once | OPHTHALMIC | Status: AC
  Administered 2019-08-11: 1 via OPHTHALMIC

## 2019-08-11 MED ORDER — PHENYLEPHRINE HCL 2.5 % OP SOLN
1.0000 [drp] | OPHTHALMIC | Status: AC | PRN
  Administered 2019-08-11 (×3): 1 [drp] via OPHTHALMIC

## 2019-08-11 MED ORDER — LIDOCAINE HCL (PF) 1 % IJ SOLN
INTRAOCULAR | Status: DC | PRN
  Administered 2019-08-11: 1 mL via OPHTHALMIC

## 2019-08-11 MED ORDER — BSS IO SOLN
INTRAOCULAR | Status: DC | PRN
  Administered 2019-08-11: 15 mL via INTRAOCULAR

## 2019-08-11 MED ORDER — POVIDONE-IODINE 5 % OP SOLN
OPHTHALMIC | Status: DC | PRN
  Administered 2019-08-11: 1 via OPHTHALMIC

## 2019-08-11 MED ORDER — SODIUM HYALURONATE 23 MG/ML IO SOLN
INTRAOCULAR | Status: DC | PRN
  Administered 2019-08-11: 0.6 mL via INTRAOCULAR

## 2019-08-11 MED ORDER — EPINEPHRINE PF 1 MG/ML IJ SOLN
INTRAOCULAR | Status: DC | PRN
  Administered 2019-08-11: 500 mL

## 2019-08-11 MED ORDER — MIDAZOLAM HCL 2 MG/2ML IJ SOLN
INTRAMUSCULAR | Status: DC | PRN
  Administered 2019-08-11: .5 mg via INTRAVENOUS

## 2019-08-11 SURGICAL SUPPLY — 18 items
CLOTH BEACON ORANGE TIMEOUT ST (SAFETY) ×2 IMPLANT
DEVICE MILOOP (MISCELLANEOUS) IMPLANT
EYE SHIELD UNIVERSAL CLEAR (GAUZE/BANDAGES/DRESSINGS) ×2 IMPLANT
GLOVE BIOGEL PI IND STRL 6.5 (GLOVE) ×1 IMPLANT
GLOVE BIOGEL PI IND STRL 7.0 (GLOVE) ×1 IMPLANT
GLOVE BIOGEL PI INDICATOR 6.5 (GLOVE) ×1
GLOVE BIOGEL PI INDICATOR 7.0 (GLOVE) ×1
LENS ALC ACRYL/TECN (Ophthalmic Related) ×2 IMPLANT
MILOOP DEVICE (MISCELLANEOUS)
NEEDLE HYPO 18GX1.5 BLUNT FILL (NEEDLE) ×2 IMPLANT
PAD ARMBOARD 7.5X6 YLW CONV (MISCELLANEOUS) ×2 IMPLANT
RING MALYGIN (MISCELLANEOUS) IMPLANT
RING MALYGIN 7.0 (MISCELLANEOUS) IMPLANT
SYR TB 1ML LL NO SAFETY (SYRINGE) ×2 IMPLANT
TAPE SURG TRANSPORE 1 IN (GAUZE/BANDAGES/DRESSINGS) ×1 IMPLANT
TAPE SURGICAL TRANSPORE 1 IN (GAUZE/BANDAGES/DRESSINGS) ×1
VISCOELASTIC ADDITIONAL (OPHTHALMIC RELATED) ×2 IMPLANT
WATER STERILE IRR 250ML POUR (IV SOLUTION) ×2 IMPLANT

## 2019-08-11 NOTE — Transfer of Care (Signed)
Immediate Anesthesia Transfer of Care Note  Patient: Stephanie Ramos  Procedure(s) Performed: CATARACT EXTRACTION PHACO AND INTRAOCULAR LENS PLACEMENT (IOC) (CDE: 9.68) (Left Eye)  Patient Location: Short Stay  Anesthesia Type:MAC  Level of Consciousness: awake, alert , oriented and patient cooperative  Airway & Oxygen Therapy: Patient Spontanous Breathing  Post-op Assessment: Report given to RN and Post -op Vital signs reviewed and stable  Post vital signs: Reviewed and stable  Last Vitals:  Vitals Value Taken Time  BP    Temp    Pulse    Resp    SpO2      Last Pain:  Vitals:   08/11/19 1106  TempSrc: Oral  PainSc: 0-No pain      Patients Stated Pain Goal: 8 (15/86/82 [id number])  Complications: No apparent anesthesia complications

## 2019-08-11 NOTE — Op Note (Signed)
Date of procedure: 08/11/19  Pre-operative diagnosis: Visually significant age-related combined cataract, Left Eye (H25.812)  Post-operative diagnosis: Visually significant age-related combined cataract, Left Eye (H25.812)  Procedure: Removal of cataract via phacoemulsification and insertion of intra-ocular lens Wynetta Emery and [place]  +17.5D into the capsular bag of the Left Eye  Attending surgeon: Gerda Diss. Shirleymae Hauth, MD, MA  Anesthesia: MAC, Topical Akten  Complications: None  Estimated Blood Loss: <56m (minimal)  Specimens: None  Implants: As above  Indications:  Visually significant age-related cataract, Left Eye  Procedure:  The patient was seen and identified in the pre-operative area. The operative eye was identified and dilated.  The operative eye was marked.  Topical anesthesia was administered to the operative eye.     The patient was then to the operative suite and placed in the supine position.  A timeout was performed confirming the patient, procedure to be performed, and all other relevant information.   The patient's face was prepped and draped in the usual fashion for intra-ocular surgery.  A lid speculum was placed into the operative eye and the surgical microscope moved into place and focused.  An inferotemporal paracentesis was created using a 20 gauge paracentesis blade.  Shugarcaine was injected into the anterior chamber.  Viscoelastic was injected into the anterior chamber.  A temporal clear-corneal main wound incision was created using a 2.465mmicrokeratome.  A continuous curvilinear capsulorrhexis was initiated using an irrigating cystitome and completed using capsulorrhexis forceps.  Hydrodissection and hydrodeliniation were performed.  Viscoelastic was injected into the anterior chamber.  A phacoemulsification handpiece and a chopper as a second instrument were used to remove the nucleus and epinucleus. The irrigation/aspiration handpiece was used to remove  any remaining cortical material.   The capsular bag was reinflated with viscoelastic, checked, and found to be intact.  The intraocular lens was inserted into the capsular bag.  The irrigation/aspiration handpiece was used to remove any remaining viscoelastic.  The clear corneal wound and paracentesis wounds were then hydrated and checked with Weck-Cels to be watertight.  The lid-speculum and drape was removed, and the patient's face was cleaned with a wet and dry 4x4.  A clear shield was taped over the eye. The patient was taken to the post-operative care unit in good condition, having tolerated the procedure well.  Post-Op Instructions: [facility] and will receive all other orders and instructions.

## 2019-08-11 NOTE — Interval H&P Note (Signed)
History and Physical Interval Note: The H and P was reviewed and updated. The patient was examined.  No changes were found after exam.  The surgical eye was marked.  08/11/2019 11:53 AM  Stephanie Ramos  has presented today for surgery, with the diagnosis of Nuclear sclerotic cataract - Left eye.  The various methods of treatment have been discussed with the patient and family. After consideration of risks, benefits and other options for treatment, the patient has consented to  Procedure(s) with comments: CATARACT EXTRACTION PHACO AND INTRAOCULAR LENS PLACEMENT (IOC) (Left) - left as a surgical intervention.  The patient's history has been reviewed, patient examined, no change in status, stable for surgery.  I have reviewed the patient's chart and labs.  Questions were answered to the patient's satisfaction.     [name]

## 2019-08-11 NOTE — Anesthesia Postprocedure Evaluation (Signed)
Anesthesia Post Note  Patient: Stephanie Ramos  Procedure(s) Performed: CATARACT EXTRACTION PHACO AND INTRAOCULAR LENS PLACEMENT (IOC) (CDE: 9.68) (Left Eye)  Patient location during evaluation: Short Stay Anesthesia Type: MAC Level of consciousness: awake and alert Pain management: pain level controlled Vital Signs Assessment: post-procedure vital signs reviewed and stable Respiratory status: spontaneous breathing Cardiovascular status: stable Postop Assessment: no apparent nausea or vomiting Anesthetic complications: no     Last Vitals:  Vitals:   08/11/19 1106  BP: (!) 168/78  Resp: 18  Temp: 36.7 C  SpO2: 99%    Last Pain:  Vitals:   08/11/19 1106  TempSrc: Oral  PainSc: 0-No pain                 Everette Rank

## 2019-08-11 NOTE — Discharge Instructions (Addendum)
Please discharge patient when stable, will follow up today with Dr. Wrzosek at the [facility] [place] office immediately following discharge.  Leave shield in place until visit.  All paperwork with discharge instructions will be given at the office.  [facility] Address:  [address]  [place], [place] [address]             Monitored Anesthesia Care, Care After These instructions provide you with information about caring for yourself after your procedure. Your health care provider may also give you more specific instructions. Your treatment has been planned according to current medical practices, but problems sometimes occur. Call your health care provider if you have any problems or questions after your procedure. What can I expect after the procedure? After your procedure, you may:  Feel sleepy for several hours.  Feel clumsy and have poor balance for several hours.  Feel forgetful about what happened after the procedure.  Have poor judgment for several hours.  Feel nauseous or vomit.  Have a sore throat if you had a breathing tube during the procedure. Follow these instructions at home: For at least 24 hours after the procedure:      Have a responsible adult stay with you. It is important to have someone help care for you until you are awake and alert.  Rest as needed.  Do not: ? Participate in activities in which you could fall or become injured. ? Drive. ? Use heavy machinery. ? Drink alcohol. ? Take sleeping pills or medicines that cause drowsiness. ? Make important decisions or sign legal documents. ? Take care of children on your own. Eating and drinking  Follow the diet that is recommended by your health care provider.  If you vomit, drink water, juice, or soup when you can drink without vomiting.  Make sure you have little or no nausea before eating solid foods. General instructions  Take over-the-counter and  prescription medicines only as told by your health care provider.  If you have sleep apnea, surgery and certain medicines can increase your risk for breathing problems. Follow instructions from your health care provider about wearing your sleep device: ? Anytime you are sleeping, including during daytime naps. ? While taking prescription pain medicines, sleeping medicines, or medicines that make you drowsy.  If you smoke, do not smoke without supervision.  Keep all follow-up visits as told by your health care provider. This is important. Contact a health care provider if:  You keep feeling nauseous or you keep vomiting.  You feel light-headed.  You develop a rash.  You have a fever. Get help right away if:  You have trouble breathing. Summary  For several hours after your procedure, you may feel sleepy and have poor judgment.  Have a responsible adult stay with you for at least 24 hours or until you are awake and alert. This information is not intended to replace advice given to you by your health care provider. Make sure you discuss any questions you have with your health care provider. Document Revised: 08/20/2017 Document Reviewed: 09/12/2015 Elsevier Patient Education  2020 [employer]vier Inc[employer]  

## 2019-08-11 NOTE — Anesthesia Preprocedure Evaluation (Signed)
Anesthesia Evaluation  Patient identified by MRN, date of birth, ID band Patient awake    Reviewed: Allergy & Precautions, NPO status , Patient's Chart, lab work & pertinent test results, reviewed documented beta blocker date and time   Airway Mallampati: II  TM Distance: >3 FB Neck ROM: full    Dental no notable dental hx.    Pulmonary    Pulmonary exam normal        Cardiovascular Exercise Tolerance: Good negative cardio ROS Normal cardiovascular exam     Neuro/Psych    GI/Hepatic   Endo/Other  negative endocrine ROS  Renal/GU   negative genitourinary   Musculoskeletal negative musculoskeletal ROS (+)   Abdominal   Peds negative pediatric ROS (+)  Hematology negative hematology ROS (+)   Anesthesia Other Findings   Reproductive/Obstetrics negative OB ROS                             Anesthesia Physical Anesthesia Plan  ASA: II  Anesthesia Plan: MAC   Post-op Pain Management:    Induction:   PONV Risk Score and Plan: 2 and Treatment may vary due to age or medical condition  Airway Management Planned:   Additional Equipment:   Intra-op Plan:   Post-operative Plan:   Informed Consent: I have reviewed the patients History and Physical, chart, labs and discussed the procedure including the risks, benefits and alternatives for the proposed anesthesia with the patient or authorized representative who has indicated his/her understanding and acceptance.       Plan Discussed with: CRNA  Anesthesia Plan Comments:         Anesthesia Quick Evaluation

## 2019-08-12 NOTE — Addendum Note (Signed)
Addendum  created 08/12/19 0843 by Franco Nones, CRNA   Charge Capture section accepted

## 2019-08-20 ENCOUNTER — Encounter (HOSPITAL_COMMUNITY): Admission: RE | Admit: 2019-08-20 | Payer: Medicare HMO | Source: Ambulatory Visit

## 2019-08-22 ENCOUNTER — Other Ambulatory Visit (HOSPITAL_COMMUNITY): Payer: Medicare HMO

## 2019-09-17 NOTE — H&P (Signed)
Surgical History & Physical  Patient Name: Aanchal Cope DOB: [birth year]  Surgery: Cataract extraction with intraocular lens implant phacoemulsification; Right Eye  Surgeon: [name] MD Surgery Date:  09/29/2019 Pre-Op Date:  09/11/2019  HPI: A 81 Yr. old female patient 1. The patient is returning after cataract surgery. The left eye is affected. Status post cataract surgery on 08-11-2019: Since the last visit, the affected area is doing well and stable. The patient's vision is improved. Patient has finished meds as instructed. Patient had 1 episode of "squiggly lines" almost full circle in [place] lasting x few minutes. No FOL, floaters, curtain, or veil. Patient used lubricating drops and closed eyes and cleared with time. No H/A or nausea or fatigue. Patient ready to have OU balanced and working together.  Medical History: Dry Eyes Cataracts LDL  Review of Systems Negative Allergic/Immunologic Negative Cardiovascular Negative Constitutional Negative Ear, Nose, Mouth & Throat Negative Endocrine Negative Eyes Negative Gastrointestinal Negative Genitourinary Negative Hemotologic/Lymphatic Negative Integumentary Negative Musculoskeletal Negative Neurological Negative Psychiatry Negative Respiratory  Social   Never smoked   Medication Refresh artificial tears, Prednisolone-gatiflox-bromfenac,  Simvastatin, CoQ10, Vitamin E, Vitamin C, Vitamin a, Calcium, Iron, Multivitamin,   Sx/Procedures Phaco c IOL OS,  Lumpectomy (Breast)-benign,   Drug Allergies  Sulfa,   History & Physical: Heent:  Cataract, Right eye NECK: supple without bruits LUNGS: lungs clear to auscultation CV: regular rate and rhythm Abdomen: soft and non-tender  Impression & Plan: Assessment: 1.  VITREOUS DETACHMENT PVD; Both Eyes (H43.813) 2.  NUCLEAR SCLEROSIS AGE RELATED; Right Eye (H25.11) 3.  CATARACT EXTRACTION STATUS; Left Eye (Z98.42)  1.  Old Symptomatic. RD precautions given.  Patient to call with increase in flashing lights/floaters/dark curtain.  Plan: 2.  Cataract accounts for the patient's decreased vision. This visual impairment is not correctable with a tolerable change in glasses or contact lenses. Cataract surgery with an implantation of a new lens should significantly improve the visual and functional status of the patient. Discussed all risks, benefits, alternatives, and potential complications. Discussed the procedures and recovery. Patient desires to have surgery. A-scan ordered and performed today for intra-ocular lens calculations. The surgery will be performed in order to improve vision for driving, reading, and for eye examinations. Recommend phacoemulsification with intra-ocular lens. Right Eye. Surgery required to correct imbalance of vision. Dilates well - shugarcaine by protocol.  Plan 2.  3 weeks after cataract surgery. Doing well with improved vision and normal eye pressure. Call with any problems or concerns.

## 2019-09-22 DIAGNOSIS — H25811 Combined forms of age-related cataract, right eye: Secondary | ICD-10-CM | POA: Diagnosis not present

## 2019-09-22 DIAGNOSIS — E7800 Pure hypercholesterolemia, unspecified: Secondary | ICD-10-CM | POA: Diagnosis not present

## 2019-09-24 ENCOUNTER — Other Ambulatory Visit: Payer: Self-pay

## 2019-09-24 ENCOUNTER — Encounter (HOSPITAL_COMMUNITY)
Admission: RE | Admit: 2019-09-24 | Discharge: 2019-09-24 | Disposition: A | Discharge status: Home / Self Care | Payer: Medicare HMO | Source: Ambulatory Visit | Attending: Ophthalmology | Admitting: Ophthalmology

## 2019-09-25 DIAGNOSIS — E7800 Pure hypercholesterolemia, unspecified: Secondary | ICD-10-CM | POA: Diagnosis not present

## 2019-09-26 ENCOUNTER — Other Ambulatory Visit: Payer: Self-pay

## 2019-09-26 ENCOUNTER — Other Ambulatory Visit (HOSPITAL_COMMUNITY)
Admission: RE | Admit: 2019-09-26 | Discharge: 2019-09-26 | Disposition: A | Discharge status: Home / Self Care | Payer: Medicare HMO | Source: Ambulatory Visit | Attending: Ophthalmology | Admitting: Ophthalmology

## 2019-09-26 DIAGNOSIS — Z20822 Contact with and (suspected) exposure to covid-19: Secondary | ICD-10-CM | POA: Insufficient documentation

## 2019-09-26 DIAGNOSIS — Z01812 Encounter for preprocedural laboratory examination: Secondary | ICD-10-CM | POA: Insufficient documentation

## 2019-09-26 MED ORDER — LIDOCAINE HCL 3.5 % OP GEL
OPHTHALMIC | Status: AC
  Filled 2019-09-26: qty 1

## 2019-09-26 MED ORDER — TETRACAINE HCL 0.5 % OP SOLN
OPHTHALMIC | Status: AC
  Filled 2019-09-26: qty 4

## 2019-09-26 MED ORDER — LIDOCAINE HCL (PF) 1 % IJ SOLN
INTRAMUSCULAR | Status: AC
  Filled 2019-09-26: qty 2

## 2019-09-26 MED ORDER — NEOMYCIN-POLYMYXIN-DEXAMETH 3.5-10000-0.1 OP SUSP
OPHTHALMIC | Status: AC
  Filled 2019-09-26: qty 5

## 2019-09-26 MED ORDER — CYCLOPENTOLATE-PHENYLEPHRINE 0.2-1 % OP SOLN
OPHTHALMIC | Status: AC
  Filled 2019-09-26: qty 2

## 2019-09-26 MED ORDER — PHENYLEPHRINE HCL 2.5 % OP SOLN
OPHTHALMIC | Status: AC
  Filled 2019-09-26: qty 15

## 2019-09-27 LAB — SARS CORONAVIRUS 2 (TAT 6-24 HRS): SARS Coronavirus 2: NEGATIVE

## 2019-09-29 ENCOUNTER — Ambulatory Visit (HOSPITAL_COMMUNITY): Payer: Medicare HMO | Admitting: Anesthesiology

## 2019-09-29 ENCOUNTER — Encounter (HOSPITAL_COMMUNITY)
Admission: RE | Disposition: A | Discharge status: Home / Self Care | Payer: Self-pay | Source: Home / Self Care | Attending: Ophthalmology

## 2019-09-29 ENCOUNTER — Other Ambulatory Visit: Payer: Self-pay

## 2019-09-29 ENCOUNTER — Ambulatory Visit (HOSPITAL_COMMUNITY)
Admission: RE | Admit: 2019-09-29 | Discharge: 2019-09-29 | Disposition: A | Discharge status: Home / Self Care | Payer: Medicare HMO | Attending: Ophthalmology | Admitting: Ophthalmology

## 2019-09-29 ENCOUNTER — Encounter (HOSPITAL_COMMUNITY): Payer: Self-pay | Admitting: Ophthalmology

## 2019-09-29 DIAGNOSIS — H25811 Combined forms of age-related cataract, right eye: Secondary | ICD-10-CM | POA: Diagnosis not present

## 2019-09-29 DIAGNOSIS — Z79899 Other long term (current) drug therapy: Secondary | ICD-10-CM | POA: Diagnosis not present

## 2019-09-29 DIAGNOSIS — H2511 Age-related nuclear cataract, right eye: Secondary | ICD-10-CM | POA: Insufficient documentation

## 2019-09-29 HISTORY — PX: CATARACT EXTRACTION W/PHACO: SHX586

## 2019-09-29 SURGERY — PHACOEMULSIFICATION, CATARACT, WITH IOL INSERTION
Anesthesia: Monitor Anesthesia Care | Site: Eye | Laterality: Right

## 2019-09-29 MED ORDER — TETRACAINE HCL 0.5 % OP SOLN
1.0000 [drp] | OPHTHALMIC | Status: AC | PRN
  Administered 2019-09-29 (×3): 1 [drp] via OPHTHALMIC

## 2019-09-29 MED ORDER — LIDOCAINE HCL (PF) 1 % IJ SOLN
INTRAOCULAR | Status: DC | PRN
  Administered 2019-09-29: 1 mL via OPHTHALMIC

## 2019-09-29 MED ORDER — PHENYLEPHRINE HCL 2.5 % OP SOLN
1.0000 [drp] | OPHTHALMIC | Status: AC | PRN
  Administered 2019-09-29 (×3): 1 [drp] via OPHTHALMIC

## 2019-09-29 MED ORDER — SODIUM HYALURONATE 23 MG/ML IO SOLN
INTRAOCULAR | Status: DC | PRN
  Administered 2019-09-29: 0.6 mL via INTRAOCULAR

## 2019-09-29 MED ORDER — POVIDONE-IODINE 5 % OP SOLN
OPHTHALMIC | Status: DC | PRN
  Administered 2019-09-29: 1 via OPHTHALMIC

## 2019-09-29 MED ORDER — CYCLOPENTOLATE-PHENYLEPHRINE 0.2-1 % OP SOLN
1.0000 [drp] | OPHTHALMIC | Status: AC | PRN
  Administered 2019-09-29 (×3): 1 [drp] via OPHTHALMIC

## 2019-09-29 MED ORDER — MIDAZOLAM HCL 2 MG/2ML IJ SOLN
INTRAMUSCULAR | Status: AC
  Filled 2019-09-29: qty 2

## 2019-09-29 MED ORDER — PROVISC 10 MG/ML IO SOLN
INTRAOCULAR | Status: DC | PRN
  Administered 2019-09-29: 0.85 mL via INTRAOCULAR

## 2019-09-29 MED ORDER — LIDOCAINE HCL 3.5 % OP GEL
1.0000 "application " | Freq: Once | OPHTHALMIC | Status: AC
  Administered 2019-09-29: 1 via OPHTHALMIC

## 2019-09-29 MED ORDER — EPINEPHRINE PF 1 MG/ML IJ SOLN
INTRAOCULAR | Status: DC | PRN
  Administered 2019-09-29: 500 mL

## 2019-09-29 MED ORDER — BSS IO SOLN
INTRAOCULAR | Status: DC | PRN
  Administered 2019-09-29: 15 mL via INTRAOCULAR

## 2019-09-29 SURGICAL SUPPLY — 12 items
CLOTH BEACON ORANGE TIMEOUT ST (SAFETY) ×2 IMPLANT
EYE SHIELD UNIVERSAL CLEAR (GAUZE/BANDAGES/DRESSINGS) ×2 IMPLANT
GLOVE BIOGEL PI IND STRL 7.0 (GLOVE) ×2 IMPLANT
GLOVE BIOGEL PI INDICATOR 7.0 (GLOVE) ×2
LENS ALC ACRYL/TECN (Ophthalmic Related) ×2 IMPLANT
NEEDLE HYPO 18GX1.5 BLUNT FILL (NEEDLE) ×2 IMPLANT
PAD ARMBOARD 7.5X6 YLW CONV (MISCELLANEOUS) ×2 IMPLANT
SYR TB 1ML LL NO SAFETY (SYRINGE) ×2 IMPLANT
TAPE SURG TRANSPORE 1 IN (GAUZE/BANDAGES/DRESSINGS) ×1 IMPLANT
TAPE SURGICAL TRANSPORE 1 IN (GAUZE/BANDAGES/DRESSINGS) ×1
VISCOELASTIC ADDITIONAL (OPHTHALMIC RELATED) ×2 IMPLANT
WATER STERILE IRR 250ML POUR (IV SOLUTION) ×2 IMPLANT

## 2019-09-29 NOTE — Op Note (Signed)
Date of procedure: 09/29/19  Pre-operative diagnosis:  Visually significant combined form age-related cataract, Right Eye (H25.811)  Post-operative diagnosis:  Visually significant combined form age-related cataract, Right Eye (H25.811)  Procedure: Removal of cataract via phacoemulsification and insertion of intra-ocular lens Wynetta Emery and [employer] [id number]  +17.0D into the capsular bag of the Right Eye  Attending surgeon: Gerda Diss. Chester Romero, MD, MA  Anesthesia: MAC, Topical Akten  Complications: None  Estimated Blood Loss: <62m (minimal)  Specimens: None  Implants: As above  Indications:  Visually significant age-related cataract, Right Eye  Procedure:  The patient was seen and identified in the pre-operative area. The operative eye was identified and dilated.  The operative eye was marked.  Topical anesthesia was administered to the operative eye.     The patient was then to the operative suite and placed in the supine position.  A timeout was performed confirming the patient, procedure to be performed, and all other relevant information.   The patient's face was prepped and draped in the usual fashion for intra-ocular surgery.  A lid speculum was placed into the operative eye and the surgical microscope moved into place and focused.  A superotemporal paracentesis was created using a 20 gauge paracentesis blade.  Shugarcaine was injected into the anterior chamber.  Viscoelastic was injected into the anterior chamber.  A temporal clear-corneal main wound incision was created using a 2.468mmicrokeratome.  A continuous curvilinear capsulorrhexis was initiated using an irrigating cystitome and completed using capsulorrhexis forceps.  Hydrodissection and hydrodeliniation were performed.  Viscoelastic was injected into the anterior chamber.  A phacoemulsification handpiece and a chopper as a second instrument were used to remove the nucleus and epinucleus. The irrigation/aspiration handpiece was  used to remove any remaining cortical material.   The capsular bag was reinflated with viscoelastic, checked, and found to be intact.  The intraocular lens was inserted into the capsular bag.  The irrigation/aspiration handpiece was used to remove any remaining viscoelastic.  The clear corneal wound and paracentesis wounds were then hydrated and checked with Weck-Cels to be watertight.  The lid-speculum was removed.  The drape was removed.  The patient's face was cleaned with a wet and dry 4x4.   Maxitrol was instilled in the eye before a clear shield was taped over the eye. The patient was taken to the post-operative care unit in good condition, having tolerated the procedure well.  Post-Op Instructions: [facility] and will receive all other orders and instructions.

## 2019-09-29 NOTE — Discharge Instructions (Signed)
Please discharge patient when stable, will follow up today with Dr. Idil Maslanka at the [facility] [place] office immediately following discharge.  Leave shield in place until visit.  All paperwork with discharge instructions will be given at the office.  [facility] Address:  [address]  [place], [place] [address]  

## 2019-09-29 NOTE — Anesthesia Preprocedure Evaluation (Signed)
Anesthesia Evaluation  Patient identified by MRN, date of birth, ID band Patient awake    Reviewed: Allergy & Precautions, NPO status , Patient's Chart, lab work & pertinent test results, reviewed documented beta blocker date and time   Airway Mallampati: II  TM Distance: >3 FB Neck ROM: full    Dental  (+) Caps Crowns x3:   Pulmonary    Pulmonary exam normal breath sounds clear to auscultation       Cardiovascular Exercise Tolerance: Good negative cardio ROS Normal cardiovascular exam Rhythm:Regular Rate:Normal     Neuro/Psych negative neurological ROS  negative psych ROS   GI/Hepatic negative GI ROS, Neg liver ROS,   Endo/Other  negative endocrine ROS  Renal/GU negative Renal ROS  negative genitourinary   Musculoskeletal negative musculoskeletal ROS (+)   Abdominal   Peds negative pediatric ROS (+)  Hematology negative hematology ROS (+)   Anesthesia Other Findings   Reproductive/Obstetrics negative OB ROS                             Anesthesia Physical  Anesthesia Plan  ASA: II  Anesthesia Plan: MAC   Post-op Pain Management:    Induction:   PONV Risk Score and Plan: 2 and Treatment may vary due to age or medical condition  Airway Management Planned: Nasal Cannula and Natural Airway  Additional Equipment:   Intra-op Plan:   Post-operative Plan:   Informed Consent: I have reviewed the patients History and Physical, chart, labs and discussed the procedure including the risks, benefits and alternatives for the proposed anesthesia with the patient or authorized representative who has indicated his/her understanding and acceptance.       Plan Discussed with: CRNA and Surgeon  Anesthesia Plan Comments:         Anesthesia Quick Evaluation

## 2019-09-29 NOTE — Anesthesia Postprocedure Evaluation (Signed)
Anesthesia Post Note  Patient: Stephanie Ramos  Procedure(s) Performed: CATARACT EXTRACTION PHACO AND INTRAOCULAR LENS PLACEMENT RIGHT EYE (Right Eye)  Patient location during evaluation: PACU Anesthesia Type: MAC Level of consciousness: awake and alert Pain management: pain level controlled Vital Signs Assessment: post-procedure vital signs reviewed and stable Respiratory status: spontaneous breathing Cardiovascular status: blood pressure returned to baseline and stable Postop Assessment: no apparent nausea or vomiting Anesthetic complications: no     Last Vitals:  Vitals:   09/29/19 0747  BP: 139/73  Pulse: 80  Resp: 19  Temp: 36.8 C  SpO2: 96%    Last Pain:  Vitals:   09/29/19 0747  TempSrc: Oral  PainSc: 0-No pain                 Orestes Geiman

## 2019-09-29 NOTE — Interval H&P Note (Signed)
History and Physical Interval Note: The H and P was reviewed and updated. The patient was examined.  No changes were found after exam.  The surgical eye was marked.  09/29/2019 8:49 AM  Stephanie Ramos  has presented today for surgery, with the diagnosis of Nuclear sclerotic cataract - Right eye.  The various methods of treatment have been discussed with the patient and family. After consideration of risks, benefits and other options for treatment, the patient has consented to  Procedure(s) with comments: CATARACT EXTRACTION PHACO AND INTRAOCULAR LENS PLACEMENT RIGHT EYE (Right) - right as a surgical intervention.  The patient's history has been reviewed, patient examined, no change in status, stable for surgery.  I have reviewed the patient's chart and labs.  Questions were answered to the patient's satisfaction.     [name]

## 2019-09-29 NOTE — Transfer of Care (Signed)
Immediate Anesthesia Transfer of Care Note  Patient: Stephanie Ramos  Procedure(s) Performed: CATARACT EXTRACTION PHACO AND INTRAOCULAR LENS PLACEMENT RIGHT EYE (Right Eye)  Patient Location: Short Stay  Anesthesia Type:MAC  Level of Consciousness: awake  Airway & Oxygen Therapy: Patient Spontanous Breathing  Post-op Assessment: Report given to RN  Post vital signs: Reviewed  Last Vitals:  Vitals Value Taken Time  BP    Temp    Pulse    Resp    SpO2      Last Pain:  Vitals:   09/29/19 0747  TempSrc: Oral  PainSc: 0-No pain         Complications: No apparent anesthesia complications

## 2019-10-02 DIAGNOSIS — N390 Urinary tract infection, site not specified: Secondary | ICD-10-CM | POA: Diagnosis not present

## 2019-10-30 DIAGNOSIS — R300 Dysuria: Secondary | ICD-10-CM | POA: Diagnosis not present

## 2019-10-30 DIAGNOSIS — N390 Urinary tract infection, site not specified: Secondary | ICD-10-CM | POA: Diagnosis not present

## 2019-12-23 DIAGNOSIS — Z79899 Other long term (current) drug therapy: Secondary | ICD-10-CM | POA: Diagnosis not present

## 2019-12-23 DIAGNOSIS — E7800 Pure hypercholesterolemia, unspecified: Secondary | ICD-10-CM | POA: Diagnosis not present

## 2019-12-24 DIAGNOSIS — Z0001 Encounter for general adult medical examination with abnormal findings: Secondary | ICD-10-CM | POA: Diagnosis not present

## 2019-12-24 DIAGNOSIS — E7800 Pure hypercholesterolemia, unspecified: Secondary | ICD-10-CM | POA: Diagnosis not present

## 2020-01-09 DIAGNOSIS — H524 Presbyopia: Secondary | ICD-10-CM | POA: Diagnosis not present

## 2020-01-09 DIAGNOSIS — H5201 Hypermetropia, right eye: Secondary | ICD-10-CM | POA: Diagnosis not present

## 2020-01-09 DIAGNOSIS — Z961 Presence of intraocular lens: Secondary | ICD-10-CM | POA: Diagnosis not present

## 2020-01-09 DIAGNOSIS — H52221 Regular astigmatism, right eye: Secondary | ICD-10-CM | POA: Diagnosis not present

## 2020-01-09 DIAGNOSIS — H04129 Dry eye syndrome of unspecified lacrimal gland: Secondary | ICD-10-CM | POA: Diagnosis not present

## 2020-03-10 DIAGNOSIS — R69 Illness, unspecified: Secondary | ICD-10-CM | POA: Diagnosis not present

## 2020-03-18 ENCOUNTER — Ambulatory Visit (INDEPENDENT_AMBULATORY_CARE_PROVIDER_SITE_OTHER): Payer: Medicare HMO | Admitting: Family Medicine

## 2020-03-18 ENCOUNTER — Other Ambulatory Visit: Payer: Self-pay

## 2020-03-18 ENCOUNTER — Encounter: Payer: Self-pay | Admitting: Family Medicine

## 2020-03-18 VITALS — BP 136/70 | HR 58 | Ht 61.0 in | Wt 142.0 lb

## 2020-03-18 DIAGNOSIS — E663 Overweight: Secondary | ICD-10-CM

## 2020-03-18 DIAGNOSIS — Z6826 Body mass index (BMI) 26.0-26.9, adult: Secondary | ICD-10-CM

## 2020-03-18 DIAGNOSIS — E7841 Elevated Lipoprotein(a): Secondary | ICD-10-CM

## 2020-03-18 DIAGNOSIS — Z2821 Immunization not carried out because of patient refusal: Secondary | ICD-10-CM | POA: Diagnosis not present

## 2020-03-18 DIAGNOSIS — N3001 Acute cystitis with hematuria: Secondary | ICD-10-CM

## 2020-03-18 LAB — POCT URINALYSIS DIPSTICK
Bilirubin, UA: NEGATIVE
Glucose, UA: POSITIVE — AB
Ketones, UA: POSITIVE
Nitrite, UA: POSITIVE
Protein, UA: POSITIVE — AB
Spec Grav, UA: 1.025 (ref 1.010–1.025)
Urobilinogen, UA: 1 E.U./dL
pH, UA: 5 (ref 5.0–8.0)

## 2020-03-18 MED ORDER — SIMVASTATIN 40 MG PO TABS: 40.0000 mg | ORAL_TABLET | Freq: Every day | ORAL | 1 refills | Status: DC

## 2020-03-18 MED ORDER — CEFADROXIL 500 MG PO CAPS: 500.0000 mg | ORAL_CAPSULE | Freq: Two times a day (BID) | ORAL | 0 refills | Status: AC

## 2020-03-18 NOTE — Assessment & Plan Note (Signed)
+   dip in office Send out for Cx and Sensitivities Antibiotic provided, will change if needed

## 2020-03-18 NOTE — Assessment & Plan Note (Signed)
Encouraged heart healthy diet and exercise

## 2020-03-18 NOTE — Patient Instructions (Addendum)
  HAPPY FALL!  I appreciate the opportunity to provide you with care for your health and wellness. Today we discussed: establish care  Follow up: 4-5 months    No labs or referrals today  Please take all of your antibiotic for your UTI  Nice to meet you today!  Have a good Holiday Season :)  Please continue to practice social distancing to keep you, your family, and our community safe.  If you must go out, please wear a mask and practice good handwashing.  It was a pleasure to see you and I look forward to continuing to work together on your health and well-being. Please do not hesitate to call the office if you need care or have questions about your care.  Have a wonderful day and week. With Gratitude, Tereasa Coop, DNP, AGNP-BC

## 2020-03-18 NOTE — Assessment & Plan Note (Signed)
Encouraged low fat diet, refills provided

## 2020-03-18 NOTE — Progress Notes (Signed)
Subjective:  Patient ID: Stephanie Ramos, female    DOB: 1946-02-14  Age: 74 y.o. MRN: [id number]  CC:  Chief Complaint  Patient presents with  . Dysuria    x1 day ago  . New Patient (Initial Visit)    establish care      HPI   Stephanie Ramos is a 74 year old female patient who presents today to establish care. She has a limited history which includes hyperlipidemia.  Is unsure of when her last colonoscopy was.  She says she might be willing to do the Cologuard in the future but would like to wait for now.  Additionally she declined getting updated mammogram, bone scan, flu vaccine, pneumonia vaccine or Covid vaccine.  She has intermittent sleep trouble.  She declines having any trouble chewing or swallowing see the dentist regularly.  She denies having any trouble or changes in her bowel or bladder habits.  No blood in urine or stool.  Denies having any memory issues.  Denies having any recent issues with falls or injuries.  Denies having any skin issues, hearing issues, vision changes.  Recently seen the eye doctor and wears readers.  Only concern today is a urinary tract infection see below.  Urinary Tract Infection  This is a new problem. The current episode started yesterday. The problem occurs every urination. The problem has been unchanged. The quality of the pain is described as aching. The pain is at a severity of 2/10. The pain is mild. There has been no fever. She is sexually active. There is a history of pyelonephritis. Associated symptoms include frequency and hesitancy. Pertinent negatives include no chills, discharge, flank pain, hematuria, nausea, possible pregnancy, sweats, urgency or vomiting. She has tried increased fluids for the symptoms. The treatment provided no relief. Her past medical history is significant for recurrent UTIs.   She denies having any recent chest pain, headaches, palpitations, leg swelling, cough, shortness of breath fevers or  chills.  Today patient denies signs and symptoms of COVID 19 infection including fever, chills, cough, shortness of breath, and headache. Past Medical, Surgical, Social History, Allergies, and Medications have been Reviewed.   Past Medical History:  Diagnosis Date  . Hyperlipemia     Current Meds  Medication Sig  . Calcium Carb-Cholecalciferol (CALCIUM+D3) 600-800 MG-UNIT TABS Take 1 tablet by mouth in the morning and at bedtime.   . cyanocobalamin ([employer] VITAMIN B12) 1000 MCG tablet Take 1,000 mcg by mouth daily.  . ferrous sulfate 325 (65 FE) MG tablet Take 325 mg by mouth every morning.  . Multiple Vitamin (MULTIVITAMIN WITH MINERALS) TABS tablet Take 1 tablet by mouth every morning.  Marland Kitchen omega-3 acid ethyl esters (LOVAZA) 1 g capsule Take by mouth 2 (two) times daily.  . simvastatin (ZOCOR) 40 MG tablet Take 1 tablet (40 mg total) by mouth daily.  . Vitamin A 2400 MCG (8000 UT) TABS Take 8,000 Units by mouth daily.   . vitamin C (ASCORBIC ACID) 500 MG tablet Take 500 mg by mouth every morning.  . Vitamin E 400 units TABS Take 400 Units by mouth daily.   . [DISCONTINUED] simvastatin (ZOCOR) 40 MG tablet Take 40 mg by mouth daily.     ROS:  Review of Systems  Constitutional: Negative.  Negative for chills.  HENT: Negative.   Eyes: Negative.   Respiratory: Negative.   Cardiovascular: Negative.   Gastrointestinal: Negative.  Negative for nausea and vomiting.  Genitourinary: Positive for dysuria, frequency and hesitancy. Negative for  flank pain, hematuria and urgency.  Musculoskeletal: Negative.   Skin: Negative.   Neurological: Negative.   Endo/Heme/Allergies: Negative.   Psychiatric/Behavioral: Negative.      Objective:   Today's Vitals: BP 136/70 (BP Location: Right Arm, Patient Position: Sitting, Cuff Size: Normal)   Pulse (!) 58   Ht 5\' 1"  (1.549 m)   Wt 142 lb (64.4 kg)   SpO2 98%   BMI 26.83 kg/m  Vitals with BMI 03/18/2020 09/29/2019 09/29/2019  Height 5\' 1"  - -   Weight 142 lbs - -  BMI 26.84 - -  Systolic 136 141 10/01/2019  Diastolic 70 64 73  Pulse 58 82 80     Physical Exam Vitals and nursing note reviewed.  Constitutional:      Appearance: Normal appearance. She is well-developed, well-groomed and overweight.  HENT:     Head: Normocephalic and atraumatic.     Right Ear: External ear normal.     Left Ear: External ear normal.     Mouth/Throat:     Comments: Mask in place  Eyes:     General:        Right eye: No discharge.        Left eye: No discharge.     Conjunctiva/sclera: Conjunctivae normal.  Cardiovascular:     Rate and Rhythm: Normal rate and regular rhythm.     Pulses: Normal pulses.     Heart sounds: Normal heart sounds.  Pulmonary:     Effort: Pulmonary effort is normal.     Breath sounds: Normal breath sounds.  Abdominal:     Tenderness: There is no right CVA tenderness or left CVA tenderness.  Musculoskeletal:        General: Normal range of motion.     Cervical back: Normal range of motion and neck supple.  Skin:    General: Skin is warm.  Neurological:     General: No focal deficit present.     Mental Status: She is alert and oriented to person, place, and time.  Psychiatric:        Attention and Perception: Attention normal.        Mood and Affect: Mood normal.        Speech: Speech normal.        Behavior: Behavior normal. Behavior is cooperative.        Thought Content: Thought content normal.        Cognition and Memory: Cognition normal.        Judgment: Judgment normal.    Assessment   1. Overweight with body mass index (BMI) of 26 to 26.9 in adult   2. Elevated lipoprotein(a)   3. Acute cystitis with hematuria   4. Refused influenza vaccine     Tests ordered Orders Placed This Encounter  Procedures  . Urine Culture  . POCT Urinalysis Dipstick     Plan: Please see assessment and plan per problem list above.   Meds ordered this encounter  Medications  . cefadroxil (DURICEF) 500 MG  capsule    Sig: Take 1 capsule (500 mg total) by mouth 2 (two) times daily for 5 days.    Dispense:  10 capsule    Refill:  0    Order Specific Question:   Supervising Provider    Answer:   SIMPSON, MARGARET E [[id number]]  . simvastatin (ZOCOR) 40 MG tablet    Sig: Take 1 tablet (40 mg total) by mouth daily.    Dispense:  90 tablet  Refill:  1    Order Specific Question:   Supervising Provider    Answer:   Kerri Perches [[id number]]    Patient to follow-up in 4-5 months   Note: This dictation was prepared with Dragon dictation along with smaller phrase technology. Similar sounding words can be transcribed inadequately or may not be corrected upon review. Any transcriptional errors that result from this process are unintentional.      Freddy Finner, NP

## 2020-03-19 ENCOUNTER — Other Ambulatory Visit (HOSPITAL_COMMUNITY)
Admission: RE | Admit: 2020-03-19 | Discharge: 2020-03-19 | Disposition: A | Discharge status: Home / Self Care | Payer: Medicare HMO | Source: Other Acute Inpatient Hospital | Attending: Family Medicine | Admitting: Family Medicine

## 2020-03-19 DIAGNOSIS — N3001 Acute cystitis with hematuria: Secondary | ICD-10-CM | POA: Diagnosis not present

## 2020-03-20 LAB — URINE CULTURE: Culture: 20000 — AB

## 2020-03-23 ENCOUNTER — Other Ambulatory Visit: Payer: Self-pay | Admitting: Family Medicine

## 2020-03-23 DIAGNOSIS — B373 Candidiasis of vulva and vagina: Secondary | ICD-10-CM

## 2020-03-23 DIAGNOSIS — B3731 Acute candidiasis of vulva and vagina: Secondary | ICD-10-CM

## 2020-03-23 MED ORDER — FLUCONAZOLE 150 MG PO TABS: 150.0000 mg | ORAL_TABLET | Freq: Every day | ORAL | 0 refills | Status: DC

## 2020-03-26 ENCOUNTER — Telehealth: Payer: Self-pay

## 2020-03-26 NOTE — Telephone Encounter (Signed)
Pt says she took the Diflucan for yeast and she is still itching some. She took it Tuesday. She wanted to know if she should give it more time or what else you advise?

## 2020-03-26 NOTE — Telephone Encounter (Signed)
Please refill times one tablet for repeat. If not better we will need to have appt in office. Thanks

## 2020-03-29 ENCOUNTER — Other Ambulatory Visit: Payer: Self-pay

## 2020-03-29 DIAGNOSIS — B373 Candidiasis of vulva and vagina: Secondary | ICD-10-CM

## 2020-03-29 DIAGNOSIS — B3731 Acute candidiasis of vulva and vagina: Secondary | ICD-10-CM

## 2020-03-29 MED ORDER — FLUCONAZOLE 150 MG PO TABS: 150.0000 mg | ORAL_TABLET | Freq: Every day | ORAL | 0 refills | Status: DC

## 2020-03-29 NOTE — Telephone Encounter (Signed)
Refilled. Pt informed.

## 2020-04-08 ENCOUNTER — Telehealth: Payer: Self-pay

## 2020-04-08 DIAGNOSIS — I10 Essential (primary) hypertension: Secondary | ICD-10-CM | POA: Diagnosis not present

## 2020-04-08 DIAGNOSIS — R0689 Other abnormalities of breathing: Secondary | ICD-10-CM | POA: Diagnosis not present

## 2020-04-08 DIAGNOSIS — R42 Dizziness and giddiness: Secondary | ICD-10-CM | POA: Diagnosis not present

## 2020-04-08 NOTE — Telephone Encounter (Signed)
Pt called to let us know she had a bad episode of vertigo and ended up calling an ambulance. They checked her out but she did not want to go to the ER. She did take a Meclizine and feels better. FYI.

## 2020-04-09 NOTE — Telephone Encounter (Signed)
Noted  

## 2020-06-14 ENCOUNTER — Other Ambulatory Visit: Payer: Self-pay

## 2020-06-14 ENCOUNTER — Encounter: Payer: Self-pay | Admitting: Internal Medicine

## 2020-06-14 ENCOUNTER — Telehealth (INDEPENDENT_AMBULATORY_CARE_PROVIDER_SITE_OTHER): Payer: Medicare HMO | Admitting: Internal Medicine

## 2020-06-14 VITALS — Ht 61.0 in | Wt 137.0 lb

## 2020-06-14 DIAGNOSIS — Z20822 Contact with and (suspected) exposure to covid-19: Secondary | ICD-10-CM | POA: Diagnosis not present

## 2020-06-14 DIAGNOSIS — Z7189 Other specified counseling: Secondary | ICD-10-CM

## 2020-06-14 NOTE — Patient Instructions (Signed)
Please get tested for COVID as scheduled.  Please continue to self-quarantine for now even while waiting for COVID test results.  Okay to use Mucinex or Robitussin for cough.  Please take Tylenol for fever, muscle aches or fatigue.  Please go to ER if new symptoms appear, such as shortness of breath, wheezing, chest pain or palpitations.

## 2020-06-14 NOTE — Progress Notes (Signed)
Virtual Visit via Telephone Note   This visit type was conducted due to national recommendations for restrictions regarding the COVID-19 Pandemic (e.g. social distancing) in an effort to limit this patient's exposure and mitigate transmission in our community.  Due to her co-morbid illnesses, this patient is at least at moderate risk for complications without adequate follow up.  This format is felt to be most appropriate for this patient at this time.  The patient did not have access to video technology/had technical difficulties with video requiring transitioning to audio format only (telephone).  All issues noted in this document were discussed and addressed.  No physical exam could be performed with this format.  Evaluation Performed:  Follow-up visit  Date:  06/14/2020   ID:  Stephanie Ramos, DOB 08-Oct-1945, MRN [id number]  Patient Location: Home Provider Location: Office/Clinic  Participants: Patient Location of Patient: Home Location of Provider: Telehealth Consent was obtain for visit to be over via telehealth. I verified that I am speaking with the correct person using two identifiers.  PCP:  Freddy Finner, NP   Chief Complaint: Nasal congestion and cough  History of Present Illness:    Stephanie Ramos is a 75 y.o. female with PMH of HLD who has a televisit for c/o cough and nasal congestion for about 3 days. She c/o dry cough and sore throat. She has tried gargling, which helped sore throat. She has tried Mucinex. She feels better today compared to yesterday. She denies any fever, chills, dyspnea, wheezing, chest pain, palpitations, or change in taste ot smell sensation. Denies any known exposure to COVID positive person. Has not been vaccinated against COVID.  The patient does have symptoms concerning for COVID-19 infection (fever, chills, cough, or new shortness of breath).   Past Medical, Surgical, Social History, Allergies, and Medications have been  Reviewed.  Past Medical History:  Diagnosis Date  . Hyperlipemia    Past Surgical History:  Procedure Laterality Date  . BREAST SURGERY    . CATARACT EXTRACTION W/PHACO Left 08/11/2019   Procedure: CATARACT EXTRACTION PHACO AND INTRAOCULAR LENS PLACEMENT (IOC) (CDE: 9.68);  Surgeon: [name], MD;  Location: [facility];  Service: Ophthalmology;  Laterality: Left;  . CATARACT EXTRACTION W/PHACO Right 09/29/2019   Procedure: CATARACT EXTRACTION PHACO AND INTRAOCULAR LENS PLACEMENT RIGHT EYE;  Surgeon: [name], MD;  Location: [facility];  Service: Ophthalmology;  Laterality: Right;  CDE: 10.60  . DILITATION & CURRETTAGE/HYSTROSCOPY WITH ESSURE       Current Meds  Medication Sig  . Calcium Carb-Cholecalciferol 600-800 MG-UNIT TABS Take 1 tablet by mouth in the morning and at bedtime.   . cyanocobalamin 1000 MCG tablet Take 1,000 mcg by mouth daily.  . ferrous sulfate 325 (65 FE) MG tablet Take 325 mg by mouth every morning.  . fluconazole (DIFLUCAN) 150 MG tablet Take 1 tablet (150 mg total) by mouth daily.  . Multiple Vitamin (MULTIVITAMIN WITH MINERALS) TABS tablet Take 1 tablet by mouth every morning.  Marland Kitchen omega-3 acid ethyl esters (LOVAZA) 1 g capsule Take by mouth 2 (two) times daily.  . simvastatin (ZOCOR) 40 MG tablet Take 1 tablet (40 mg total) by mouth daily.  . Vitamin A 2400 MCG (8000 UT) TABS Take 8,000 Units by mouth daily.   . vitamin C (ASCORBIC ACID) 500 MG tablet Take 500 mg by mouth every morning.  . Vitamin E 400 units TABS Take 400 Units by mouth daily.      Allergies:  Sulfa antibiotics   ROS:   Please see the history of present illness.     All other systems reviewed and are negative.   Labs/Other Tests and Data Reviewed:    Recent Labs: No results found for requested labs within last 8760 hours.   Recent Lipid Panel No results found for: CHOL, TRIG, HDL, CHOLHDL, LDLCALC, LDLDIRECT  Wt Readings from Last 3 Encounters:  06/14/20 137 lb (62.1 kg)   03/18/20 142 lb (64.4 kg)  08/11/19 149 lb (67.6 kg)     ASSESSMENT & PLAN:    Suspected COVID-19 infection Advised to continue Mucinex as needed for cough Tylenol as needed for fever, chills, myalgias or fatigue Scheduled for COVID RTPCR Advised to continue to self quarantine even while test results are pending Advised to go to ER if she has shortness of breath, chest pain or palpitations Educated about progression of viral infection.  Time:   Today, I have spent 15 minutes reviewing the chart, including problem list, medications, and with the patient with telehealth technology discussing the above problems.   Medication Adjustments/Labs and Tests Ordered: Current medicines are reviewed at length with the patient today.  Concerns regarding medicines are outlined above.   Tests Ordered: No orders of the defined types were placed in this encounter.   Medication Changes: No orders of the defined types were placed in this encounter.    Note: This dictation was prepared with Dragon dictation along with smaller phrase technology. Similar sounding words can be transcribed inadequately or may not be corrected upon review. Any transcriptional errors that result from this process are unintentional.      Disposition:  Follow up  Signed, Anabel Halon, MD  06/14/2020 10:43 AM     [facility]

## 2020-06-15 ENCOUNTER — Other Ambulatory Visit: Payer: Medicare HMO

## 2020-06-15 DIAGNOSIS — Z20822 Contact with and (suspected) exposure to covid-19: Secondary | ICD-10-CM

## 2020-06-17 LAB — SARS-COV-2, NAA 2 DAY TAT

## 2020-06-17 LAB — NOVEL CORONAVIRUS, NAA: SARS-CoV-2, NAA: DETECTED — AB

## 2020-06-18 ENCOUNTER — Telehealth: Payer: Self-pay

## 2020-06-18 NOTE — Telephone Encounter (Signed)
Called to discuss with patient about COVID-19 symptoms and the use of one of the available treatments for those with mild to moderate Covid symptoms and at a high risk of hospitalization.  Pt appears to qualify for outpatient treatment due to co-morbid conditions and/or a member of an at-risk group in accordance with the FDA Emergency Use Authorization.    Symptom onset: 06/11/20 Vaccinated: No Booster? No Immunocompromised? No Qualifiers: No  Unable to reach pt - Reached pt. States she feels better and does not need further treatment.   Stephanie Ramos

## 2020-06-21 ENCOUNTER — Telehealth (INDEPENDENT_AMBULATORY_CARE_PROVIDER_SITE_OTHER): Payer: Medicare HMO | Admitting: Internal Medicine

## 2020-06-21 ENCOUNTER — Other Ambulatory Visit: Payer: Self-pay

## 2020-06-21 ENCOUNTER — Encounter: Payer: Self-pay | Admitting: Internal Medicine

## 2020-06-21 VITALS — Ht 61.0 in | Wt 137.0 lb

## 2020-06-21 DIAGNOSIS — Z7189 Other specified counseling: Secondary | ICD-10-CM | POA: Diagnosis not present

## 2020-06-21 DIAGNOSIS — B373 Candidiasis of vulva and vagina: Secondary | ICD-10-CM | POA: Diagnosis not present

## 2020-06-21 DIAGNOSIS — B3731 Acute candidiasis of vulva and vagina: Secondary | ICD-10-CM

## 2020-06-21 DIAGNOSIS — U071 COVID-19: Secondary | ICD-10-CM

## 2020-06-21 MED ORDER — FLUCONAZOLE 150 MG PO TABS: 150.0000 mg | ORAL_TABLET | Freq: Every day | ORAL | 0 refills | Status: DC

## 2020-06-21 NOTE — Progress Notes (Signed)
Virtual Visit via Telephone Note   This visit type was conducted due to national recommendations for restrictions regarding the COVID-19 Pandemic (e.g. social distancing) in an effort to limit this patient's exposure and mitigate transmission in our community.  Due to her co-morbid illnesses, this patient is at least at moderate risk for complications without adequate follow up.  This format is felt to be most appropriate for this patient at this time.  The patient did not have access to video technology/had technical difficulties with video requiring transitioning to audio format only (telephone).  All issues noted in this document were discussed and addressed.  No physical exam could be performed with this format.  Evaluation Performed:  Follow-up visit  Date:  06/21/2020   ID:  Stephanie Ramos, DOB 1945/10/15, MRN [id number]  Patient Location: Home Provider Location: Home Office  Location of Patient: Home Location of Provider: Telehealth Consent was obtain for visit to be over via telehealth. I verified that I am speaking with the correct person using two identifiers.  PCP:  Freddy Finner, NP   Chief Complaint: Perineal area itching  History of Present Illness:    Stephanie Ramos is a 75 y.o. female who has a televisit for complaint of perineal area itching.  She denies any vaginal discharge or bleeding.  She has a history of fungal vaginitis in the past.  She has been trying to increase her water intake to help with symptoms.  She denies any dysuria or hematuria.  Of note, patient was recently diagnosed with COVID-19, but states that she has been doing well now.  She denies any fever, chills, sore throat, dyspnea, chest pain or palpitations.   The patient does not have symptoms concerning for COVID-19 infection (fever, chills, cough, or new shortness of breath).   Past Medical, Surgical, Social History, Allergies, and Medications have been Reviewed.  Past Medical  History:  Diagnosis Date  . Hyperlipemia    Past Surgical History:  Procedure Laterality Date  . BREAST SURGERY    . CATARACT EXTRACTION W/PHACO Left 08/11/2019   Procedure: CATARACT EXTRACTION PHACO AND INTRAOCULAR LENS PLACEMENT (IOC) (CDE: 9.68);  Surgeon: [name], MD;  Location: [facility];  Service: Ophthalmology;  Laterality: Left;  . CATARACT EXTRACTION W/PHACO Right 09/29/2019   Procedure: CATARACT EXTRACTION PHACO AND INTRAOCULAR LENS PLACEMENT RIGHT EYE;  Surgeon: [name], MD;  Location: [facility];  Service: Ophthalmology;  Laterality: Right;  CDE: 10.60  . DILITATION & CURRETTAGE/HYSTROSCOPY WITH ESSURE       Current Meds  Medication Sig  . Calcium Carb-Cholecalciferol 600-800 MG-UNIT TABS Take 1 tablet by mouth in the morning and at bedtime.   . cyanocobalamin 1000 MCG tablet Take 1,000 mcg by mouth daily.  . ferrous sulfate 325 (65 FE) MG tablet Take 325 mg by mouth every morning.  . Multiple Vitamin (MULTIVITAMIN WITH MINERALS) TABS tablet Take 1 tablet by mouth every morning.  Marland Kitchen omega-3 acid ethyl esters (LOVAZA) 1 g capsule Take by mouth 2 (two) times daily.  . simvastatin (ZOCOR) 40 MG tablet Take 1 tablet (40 mg total) by mouth daily.  . Vitamin A 2400 MCG (8000 UT) TABS Take 8,000 Units by mouth daily.   . vitamin C (ASCORBIC ACID) 500 MG tablet Take 500 mg by mouth every morning.  . Vitamin E 400 units TABS Take 400 Units by mouth daily.   . [DISCONTINUED] fluconazole (DIFLUCAN) 150 MG tablet Take 1 tablet (150 mg total) by mouth daily.  Allergies:   Sulfa antibiotics   ROS:   Please see the history of present illness.     All other systems reviewed and are negative.   Labs/Other Tests and Data Reviewed:    Recent Labs: No results found for requested labs within last 8760 hours.   Recent Lipid Panel No results found for: CHOL, TRIG, HDL, CHOLHDL, LDLCALC, LDLDIRECT  Wt Readings from Last 3 Encounters:  06/21/20 137 lb (62.1 kg)  06/14/20 137 lb  (62.1 kg)  03/18/20 142 lb (64.4 kg)      ASSESSMENT & PLAN:    Acute vaginitis, likely fungal Fluconazole prescribed Advised to follow routine hygienic care Continue increased water intake up to 64 ounces  COVID-19 infection More than 10 days from symptom onset Now asymptomatic-afebrile Educated about progression of the COVID-19 infection  Time:   Today, I have spent 15 minutes reviewing the chart, including problem list, medications, and with the patient with telehealth technology discussing the above problems.   Medication Adjustments/Labs and Tests Ordered: Current medicines are reviewed at length with the patient today.  Concerns regarding medicines are outlined above.   Tests Ordered: No orders of the defined types were placed in this encounter.   Medication Changes: Meds ordered this encounter  Medications  . fluconazole (DIFLUCAN) 150 MG tablet    Sig: Take 1 tablet (150 mg total) by mouth daily.    Dispense:  1 tablet    Refill:  0     Note: This dictation was prepared with Dragon dictation along with smaller phrase technology. Similar sounding words can be transcribed inadequately or may not be corrected upon review. Any transcriptional errors that result from this process are unintentional.      Disposition:  Follow up  Signed, Anabel Halon, MD  06/21/2020 11:29 AM     [facility]

## 2020-06-21 NOTE — Patient Instructions (Signed)
Vaginal Yeast Infection, Adult  Vaginal yeast infection is a condition that causes vaginal discharge as well as soreness, swelling, and redness (inflammation) of the vagina. This is a common condition. Some women get this infection frequently. What are the causes? This condition is caused by a change in the normal balance of the yeast (candida) and bacteria that live in the vagina. This change causes an overgrowth of yeast, which causes the inflammation. What increases the risk? The condition is more likely to develop in women who:  Take antibiotic medicines.  Have diabetes.  Take birth control pills.  Are pregnant.  Douche often.  Have a weak body defense system (immune system).  Have been taking steroid medicines for a long time.  Frequently wear tight clothing. What are the signs or symptoms? Symptoms of this condition include:  White, thick, creamy vaginal discharge.  Swelling, itching, redness, and irritation of the vagina. The lips of the vagina (vulva) may be affected as well.  Pain or a burning feeling while urinating.  Pain during sex. How is this diagnosed? This condition is diagnosed based on:  Your medical history.  A physical exam.  A pelvic exam. Your health care provider will examine a sample of your vaginal discharge under a microscope. Your health care provider may send this sample for testing to confirm the diagnosis. How is this treated? This condition is treated with medicine. Medicines may be over-the-counter or prescription. You may be told to use one or more of the following:  Medicine that is taken by mouth (orally).  Medicine that is applied as a cream (topically).  Medicine that is inserted directly into the vagina (suppository). Follow these instructions at home: Lifestyle  Do not have sex until your health care provider approves. Tell your sex partner that you have a yeast infection. That person should go to his or her health care  provider and ask if they should also be treated.  Do not wear tight clothes, such as pantyhose or tight pants.  Wear breathable cotton underwear. General instructions  Take or apply over-the-counter and prescription medicines only as told by your health care provider.  Eat more yogurt. This may help to keep your yeast infection from returning.  Do not use tampons until your health care provider approves.  Try taking a sitz bath to help with discomfort. This is a warm water bath that is taken while you are sitting down. The water should only come up to your hips and should cover your buttocks. Do this 3-4 times per day or as told by your health care provider.  Do not douche.  If you have diabetes, keep your blood sugar levels under control.  Keep all follow-up visits as told by your health care provider. This is important.   Contact a health care provider if:  You have a fever.  Your symptoms go away and then return.  Your symptoms do not get better with treatment.  Your symptoms get worse.  You have new symptoms.  You develop blisters in or around your vagina.  You have blood coming from your vagina and it is not your menstrual period.  You develop pain in your abdomen. Summary  Vaginal yeast infection is a condition that causes discharge as well as soreness, swelling, and redness (inflammation) of the vagina.  This condition is treated with medicine. Medicines may be over-the-counter or prescription.  Take or apply over-the-counter and prescription medicines only as told by your health care provider.  Do not   douche. Do not have sex or use tampons until your health care provider approves.  Contact a health care provider if your symptoms do not get better with treatment or your symptoms go away and then return. This information is not intended to replace advice given to you by your health care provider. Make sure you discuss any questions you have with your health care  provider. Document Revised: 12/20/2018 Document Reviewed: 10/08/2017 Elsevier Patient Education  2021 [employer]  

## 2020-07-19 ENCOUNTER — Telehealth (INDEPENDENT_AMBULATORY_CARE_PROVIDER_SITE_OTHER): Payer: Medicare HMO | Admitting: Internal Medicine

## 2020-07-19 ENCOUNTER — Encounter: Payer: Self-pay | Admitting: Internal Medicine

## 2020-07-19 ENCOUNTER — Other Ambulatory Visit: Payer: Self-pay

## 2020-07-19 DIAGNOSIS — E7841 Elevated Lipoprotein(a): Secondary | ICD-10-CM

## 2020-07-19 DIAGNOSIS — R42 Dizziness and giddiness: Secondary | ICD-10-CM

## 2020-07-19 MED ORDER — MECLIZINE HCL 25 MG PO TABS: 25.0000 mg | ORAL_TABLET | Freq: Three times a day (TID) | ORAL | 2 refills | Status: DC | PRN

## 2020-07-19 NOTE — Assessment & Plan Note (Signed)
On Simvastatin Check lipid profile 

## 2020-07-19 NOTE — Patient Instructions (Signed)
Please get fasting blood tests done in this week.  Please take Meclizine for vertigo as prescribed.  Please avoid any sudden positional changes.  Avoid skipping any meal. Please try to stay hydrated by taking at least 2 liters of fluid in a day.

## 2020-07-19 NOTE — Progress Notes (Signed)
Virtual Visit via Telephone Note   This visit type was conducted due to national recommendations for restrictions regarding the COVID-19 Pandemic (e.g. social distancing) in an effort to limit this patient's exposure and mitigate transmission in our community.  Due to her co-morbid illnesses, this patient is at least at moderate risk for complications without adequate follow up.  This format is felt to be most appropriate for this patient at this time.  The patient did not have access to video technology/had technical difficulties with video requiring transitioning to audio format only (telephone).  All issues noted in this document were discussed and addressed.  No physical exam could be performed with this format.  Evaluation Performed:  Follow-up visit  Date:  07/19/2020   ID:  Stephanie Ramos, DOB 1945-11-09, MRN [id number]  Patient Location: Home Provider Location: Office/Clinic  Participants: Patient Location of Patient: Home Location of Provider: Telehealth Consent was obtain for visit to be over via telehealth. I verified that I am speaking with the correct person using two identifiers.  PCP:  Freddy Finner, NP   Chief Complaint:  Vertigo  History of Present Illness:    Stephanie Ramos is a 75 y.o. female with PMH of HLD and vertigo who has a televisit for c/o vertigo.  She has had h/o vertigo and takes Meclizine PRN. She had another episode in 04/2020, but since then she has not had to take Meclizine frequently. She has currently run out of Meclizine. She denies any nausea, vomiting, hearing problem or tinnitus currently.  She also would like to have her cholesterol checked as she has not had it checked for long time. She is on Simvastatin and Lovaza.  The patient does not have symptoms concerning for COVID-19 infection (fever, chills, cough, or new shortness of breath).   Past Medical, Surgical, Social History, Allergies, and Medications have been  Reviewed.  Past Medical History:  Diagnosis Date  . Hyperlipemia    Past Surgical History:  Procedure Laterality Date  . BREAST SURGERY    . CATARACT EXTRACTION W/PHACO Left 08/11/2019   Procedure: CATARACT EXTRACTION PHACO AND INTRAOCULAR LENS PLACEMENT (IOC) (CDE: 9.68);  Surgeon: [name], MD;  Location: [facility];  Service: Ophthalmology;  Laterality: Left;  . CATARACT EXTRACTION W/PHACO Right 09/29/2019   Procedure: CATARACT EXTRACTION PHACO AND INTRAOCULAR LENS PLACEMENT RIGHT EYE;  Surgeon: [name], MD;  Location: [facility];  Service: Ophthalmology;  Laterality: Right;  CDE: 10.60  . DILITATION & CURRETTAGE/HYSTROSCOPY WITH ESSURE       Current Meds  Medication Sig  . Calcium Carb-Cholecalciferol 600-800 MG-UNIT TABS Take 1 tablet by mouth in the morning and at bedtime.   . cyanocobalamin 1000 MCG tablet Take 1,000 mcg by mouth daily.  . ferrous sulfate 325 (65 FE) MG tablet Take 325 mg by mouth every morning.  . meclizine (ANTIVERT) 25 MG tablet Take 1 tablet (25 mg total) by mouth 3 (three) times daily as needed for dizziness.  . Multiple Vitamin (MULTIVITAMIN WITH MINERALS) TABS tablet Take 1 tablet by mouth every morning.  Marland Kitchen omega-3 acid ethyl esters (LOVAZA) 1 g capsule Take by mouth 2 (two) times daily.  . simvastatin (ZOCOR) 40 MG tablet Take 1 tablet (40 mg total) by mouth daily.  . Vitamin A 2400 MCG (8000 UT) TABS Take 8,000 Units by mouth daily.   . vitamin C (ASCORBIC ACID) 500 MG tablet Take 500 mg by mouth every morning.  . Vitamin E 400 units TABS  Take 400 Units by mouth daily.      Allergies:   Sulfa antibiotics   ROS:   Please see the history of present illness.     All other systems reviewed and are negative.   Labs/Other Tests and Data Reviewed:    Recent Labs: No results found for requested labs within last 8760 hours.   Recent Lipid Panel No results found for: CHOL, TRIG, HDL, CHOLHDL, LDLCALC, LDLDIRECT  Wt Readings from Last 3  Encounters:  06/21/20 137 lb (62.1 kg)  06/14/20 137 lb (62.1 kg)  03/18/20 142 lb (64.4 kg)     ASSESSMENT & PLAN:    Hyperlipemia On Simvastatin Check lipid profile  Vertigo Refilled Meclizine Currently stable    Time:   Today, I have spent 11 minutes reviewing the chart, including problem list, medications, and with the patient with telehealth technology discussing the above problems.   Medication Adjustments/Labs and Tests Ordered: Current medicines are reviewed at length with the patient today.  Concerns regarding medicines are outlined above.   Tests Ordered: Orders Placed This Encounter  Procedures  . Lipid Profile  . Basic Metabolic Panel (BMET)    Medication Changes: Meds ordered this encounter  Medications  . meclizine (ANTIVERT) 25 MG tablet    Sig: Take 1 tablet (25 mg total) by mouth 3 (three) times daily as needed for dizziness.    Dispense:  30 tablet    Refill:  2     Note: This dictation was prepared with Dragon dictation along with smaller phrase technology. Similar sounding words can be transcribed inadequately or may not be corrected upon review. Any transcriptional errors that result from this process are unintentional.      Disposition:  Follow up  Signed, Anabel Halon, MD  07/19/2020 11:05 AM     [facility]

## 2020-07-19 NOTE — Assessment & Plan Note (Signed)
Refilled Meclizine Currently stable

## 2020-07-20 ENCOUNTER — Ambulatory Visit: Payer: Medicare HMO | Admitting: Family Medicine

## 2020-07-20 LAB — LIPID PANEL
Chol/HDL Ratio: 2.9 ratio (ref 0.0–4.4)
Cholesterol, Total: 206 mg/dL — ABNORMAL HIGH (ref 100–199)
HDL: 70 mg/dL (ref >39)
LDL Chol Calc (NIH): 120 mg/dL — ABNORMAL HIGH (ref 0–99)
Triglycerides: 93 mg/dL (ref 0–149)
VLDL Cholesterol Cal: 16 mg/dL (ref 5–40)

## 2020-07-20 LAB — BASIC METABOLIC PANEL
BUN/Creatinine Ratio: 22 (ref 12–28)
BUN: 16 mg/dL (ref 8–27)
CO2: 22 mmol/L (ref 20–29)
Calcium: 9.6 mg/dL (ref 8.7–10.3)
Chloride: 104 mmol/L (ref 96–106)
Creatinine, Ser: 0.73 mg/dL (ref 0.57–1.00)
GFR calc Af Amer: 94 mL/min/{1.73_m2} (ref >59)
GFR calc non Af Amer: 82 mL/min/{1.73_m2} (ref >59)
Glucose: 90 mg/dL (ref 65–99)
Potassium: 4.6 mmol/L (ref 3.5–5.2)
Sodium: 143 mmol/L (ref 134–144)

## 2020-09-28 ENCOUNTER — Other Ambulatory Visit: Payer: Self-pay

## 2020-09-28 ENCOUNTER — Encounter: Payer: Self-pay | Admitting: Internal Medicine

## 2020-09-28 ENCOUNTER — Telehealth: Payer: Self-pay

## 2020-09-28 ENCOUNTER — Ambulatory Visit (INDEPENDENT_AMBULATORY_CARE_PROVIDER_SITE_OTHER): Payer: Medicare HMO | Admitting: Internal Medicine

## 2020-09-28 ENCOUNTER — Other Ambulatory Visit (HOSPITAL_COMMUNITY)
Admission: RE | Admit: 2020-09-28 | Discharge: 2020-09-28 | Disposition: A | Discharge status: Home / Self Care | Payer: Medicare HMO | Source: Ambulatory Visit | Attending: Internal Medicine | Admitting: Internal Medicine

## 2020-09-28 DIAGNOSIS — N898 Other specified noninflammatory disorders of vagina: Secondary | ICD-10-CM | POA: Diagnosis not present

## 2020-09-28 DIAGNOSIS — N760 Acute vaginitis: Secondary | ICD-10-CM | POA: Diagnosis not present

## 2020-09-28 LAB — POCT URINALYSIS DIP (CLINITEK)
Bilirubin, UA: NEGATIVE
Glucose, UA: NEGATIVE mg/dL
Ketones, POC UA: NEGATIVE mg/dL
Nitrite, UA: NEGATIVE
POC PROTEIN,UA: NEGATIVE
Spec Grav, UA: 1.015 (ref 1.010–1.025)
Urobilinogen, UA: 0.2 E.U./dL
pH, UA: 6 (ref 5.0–8.0)

## 2020-09-28 MED ORDER — FLUCONAZOLE 150 MG PO TABS: 150.0000 mg | ORAL_TABLET | Freq: Once | ORAL | 0 refills | Status: AC

## 2020-09-28 NOTE — Telephone Encounter (Signed)
error 

## 2020-09-28 NOTE — Patient Instructions (Signed)
Vaginitis  Vaginitis is irritation and swelling of the vagina. Treatment will depend on the cause. What are the causes? It can be caused by:  Bacteria.  Yeast.  A parasite.  A virus.  Low hormone levels.  Bubble baths, scented tampons, and feminine sprays. Other things can change the balance of the yeast and bacteria that live in the vagina. These include:  Antibiotic medicines.  Not being clean enough.  Some birth control methods.  Sex.  Infection.  Diabetes.  A weakened body defense system (immune system). What increases the risk?  Smoking or being around someone who smokes.  Using washes (douches), scented tampons, or scented pads.  Wearing tight pants or thong underwear.  Using birth control pills or an IUD.  Having sex without a condom or having a lot of partners.  Having an STI.  Using a certain product to kill sperm (nonoxynol-9).  Eating foods that are high in sugar.  Having diabetes.  Having low levels of a female hormone.  Having a weakened body defense system.  Being pregnant or breastfeeding. What are the signs or symptoms?  Fluid coming from the vagina that is not normal.  A bad smell.  Itching, pain, or swelling.  Pain with sex.  Pain or burning when you pee (urinate). Sometimes there are no symptoms. How is this treated? Treatment may include:  Antibiotic creams or pills.  Antifungal medicines.  Medicines to ease symptoms if you have a virus. Your sex partner should also be treated.  Estrogen medicines.  Avoiding scented soaps, sprays, or douches.  Stopping use of products that caused irritation and then using a cream to treat symptoms. Follow these instructions at home: Lifestyle  Keep the area around your vagina clean and dry. ? Avoid using soap. ? Rinse the area with water.  Until your doctor says it is okay: ? Do not use washes for the vagina. ? Do not use tampons. ? Do not have sex.  Wipe from front to  back after going to the bathroom.  When your doctor says it is okay, practice safe sex and use condoms. General instructions  Take over-the-counter and prescription medicines only as told by your doctor.  If you were prescribed an antibiotic medicine, take or use it as told by your doctor. Do not stop taking or using it even if you start to feel better.  Keep all follow-up visits. How is this prevented?  Do not use things that can irritate the vagina, such as fabric softeners. Avoid these products if they are scented: ? Sprays. ? Detergents. ? Tampons. ? Products for cleaning the vagina. ? Soaps or bubble baths.  Let air reach your vagina. To do this: ? Wear cotton underwear. ? Do not wear:  Underwear while you sleep.  Tight pants.  Thong underwear.  Underwear or nylons without a cotton panel. ? Take off any wet clothing, such as bathing suits, as soon as you can. ? Practice safe sex and use condoms. Contact a doctor if:  You have pain in your belly or in the area between your hips.  You have a fever or chills.  Your symptoms last for more than 2-3 days. Get help right away if:  You have a fever and your symptoms get worse all of a sudden. Summary  Vaginitis is irritation and swelling of the vagina.  Treatment will depend on the cause of the condition.  Do not use washes or tampons or have sex until your doctor says it   is okay. This information is not intended to replace advice given to you by your health care provider. Make sure you discuss any questions you have with your health care provider. Document Revised: 11/20/2019 Document Reviewed: 11/20/2019 Elsevier Patient Education  2021 [employer]  

## 2020-09-28 NOTE — Telephone Encounter (Signed)
Appt made for pt

## 2020-09-28 NOTE — Telephone Encounter (Signed)
Patient requesting a refill on fluconazole ph# [phone]

## 2020-09-28 NOTE — Progress Notes (Signed)
Virtual Visit via Telephone Note   This visit type was conducted due to national recommendations for restrictions regarding the COVID-19 Pandemic (e.g. social distancing) in an effort to limit this patient's exposure and mitigate transmission in our community.  Due to her co-morbid illnesses, this patient is at least at moderate risk for complications without adequate follow up.  This format is felt to be most appropriate for this patient at this time.  The patient did not have access to video technology/had technical difficulties with video requiring transitioning to audio format only (telephone).  All issues noted in this document were discussed and addressed.  No physical exam could be performed with this format.  Evaluation Performed:  Follow-up visit  Date:  09/28/2020   ID:  Stephanie, Ramos August 19, 1945, MRN [id number]  Patient Location: Home Provider Location: Office/Clinic  Participants: Patient Location of Patient: Home Location of Provider: Telehealth Consent was obtain for visit to be over via telehealth. I verified that I am speaking with the correct person using two identifiers.  PCP:  Freddy Finner, NP   Chief Complaint:  Vaginal area itching  History of Present Illness:    Stephanie Ramos is a 75 y.o. female who has a televisit for c/o vaginal area itching, but denies any discharge currently. She denies any fever, chills, nausea, vomiting, pelvic or flank pain. She had an episode of fungal vaginitis in 06/2020.  The patient does not have symptoms concerning for COVID-19 infection (fever, chills, cough, or new shortness of breath).   Past Medical, Surgical, Social History, Allergies, and Medications have been Reviewed.  Past Medical History:  Diagnosis Date  . Hyperlipemia    Past Surgical History:  Procedure Laterality Date  . BREAST SURGERY    . CATARACT EXTRACTION W/PHACO Left 08/11/2019   Procedure: CATARACT EXTRACTION PHACO AND INTRAOCULAR LENS  PLACEMENT (IOC) (CDE: 9.68);  Surgeon: [name], MD;  Location: [facility];  Service: Ophthalmology;  Laterality: Left;  . CATARACT EXTRACTION W/PHACO Right 09/29/2019   Procedure: CATARACT EXTRACTION PHACO AND INTRAOCULAR LENS PLACEMENT RIGHT EYE;  Surgeon: [name], MD;  Location: [facility];  Service: Ophthalmology;  Laterality: Right;  CDE: 10.60  . DILITATION & CURRETTAGE/HYSTROSCOPY WITH ESSURE       Current Meds  Medication Sig  . Calcium Carb-Cholecalciferol 600-800 MG-UNIT TABS Take 1 tablet by mouth in the morning and at bedtime.  . cyanocobalamin 1000 MCG tablet Take 1,000 mcg by mouth daily.  . ferrous sulfate 325 (65 FE) MG tablet Take 325 mg by mouth every morning.  . meclizine (ANTIVERT) 25 MG tablet Take 1 tablet (25 mg total) by mouth 3 (three) times daily as needed for dizziness.  . Multiple Vitamin (MULTIVITAMIN WITH MINERALS) TABS tablet Take 1 tablet by mouth every morning.  Marland Kitchen omega-3 acid ethyl esters (LOVAZA) 1 g capsule Take by mouth 2 (two) times daily.  . simvastatin (ZOCOR) 40 MG tablet Take 1 tablet (40 mg total) by mouth daily.  . Vitamin A 2400 MCG (8000 UT) TABS Take 8,000 Units by mouth daily.  . vitamin C (ASCORBIC ACID) 500 MG tablet Take 500 mg by mouth every morning.  . Vitamin E 400 units TABS Take 400 Units by mouth daily.     Allergies:   Sulfa antibiotics   ROS:   Please see the history of present illness.     All other systems reviewed and are negative.   Labs/Other Tests and Data Reviewed:    Recent  Labs: 07/19/2020: BUN 16; Creatinine, Ser 0.73; Potassium 4.6; Sodium 143   Recent Lipid Panel Lab Results  Component Value Date/Time   CHOL 206 (H) 07/19/2020 11:37 AM   TRIG 93 07/19/2020 11:37 AM   HDL 70 07/19/2020 11:37 AM   CHOLHDL 2.9 07/19/2020 11:37 AM   LDLCALC 120 (H) 07/19/2020 11:37 AM    Wt Readings from Last 3 Encounters:  06/21/20 137 lb (62.1 kg)  06/14/20 137 lb (62.1 kg)  03/18/20 142 lb (64.4 kg)       ASSESSMENT & PLAN:    Acute vaginitis Check vaginal swab/urine cytology UA reviewed, check urine culture Prescribed Fluconazole Keep area clean and dry.  Time:   Today, I have spent 8 minutes reviewing the chart, including problem list, medications, and with the patient with telehealth technology discussing the above problems.   Medication Adjustments/Labs and Tests Ordered: Current medicines are reviewed at length with the patient today.  Concerns regarding medicines are outlined above.   Tests Ordered: Orders Placed This Encounter  Procedures  . Urine Culture  . POCT URINALYSIS DIP (CLINITEK)    Medication Changes: No orders of the defined types were placed in this encounter.    Note: This dictation was prepared with Dragon dictation along with smaller phrase technology. Similar sounding words can be transcribed inadequately or may not be corrected upon review. Any transcriptional errors that result from this process are unintentional.      Disposition:  Follow up  Signed, Anabel Halon, MD  09/28/2020 1:07 PM     [facility]

## 2020-09-29 ENCOUNTER — Ambulatory Visit (INDEPENDENT_AMBULATORY_CARE_PROVIDER_SITE_OTHER): Payer: Medicare HMO

## 2020-09-29 VITALS — Ht 61.0 in | Wt 137.0 lb

## 2020-09-29 DIAGNOSIS — Z0000 Encounter for general adult medical examination without abnormal findings: Secondary | ICD-10-CM

## 2020-09-29 LAB — URINE CYTOLOGY ANCILLARY ONLY
Bacterial Vaginitis (gardnerella): NEGATIVE
Candida Glabrata: NEGATIVE
Candida Vaginitis: NEGATIVE
Comment: NEGATIVE
Comment: NEGATIVE
Comment: NEGATIVE

## 2020-09-29 NOTE — Progress Notes (Signed)
Subjective:   Stephanie Ramos is a 75 y.o. female who presents for Medicare Annual (Subsequent) preventive examination.  Review of Systems       Objective:    There were no vitals filed for this visit. There is no height or weight on file to calculate BMI.  Advanced Directives 09/29/2019 12/28/2017  Does Patient Have a Medical Advance Directive? No No  Would patient like information on creating a medical advance directive? - No - Patient declined    Current Medications (verified) Outpatient Encounter Medications as of 09/29/2020  Medication Sig  . Calcium Carb-Cholecalciferol 600-800 MG-UNIT TABS Take 1 tablet by mouth in the morning and at bedtime.  . cyanocobalamin 1000 MCG tablet Take 1,000 mcg by mouth daily.  . ferrous sulfate 325 (65 FE) MG tablet Take 325 mg by mouth every morning.  . meclizine (ANTIVERT) 25 MG tablet Take 1 tablet (25 mg total) by mouth 3 (three) times daily as needed for dizziness.  . Multiple Vitamin (MULTIVITAMIN WITH MINERALS) TABS tablet Take 1 tablet by mouth every morning.  Marland Kitchen omega-3 acid ethyl esters (LOVAZA) 1 g capsule Take by mouth 2 (two) times daily.  . simvastatin (ZOCOR) 40 MG tablet Take 1 tablet (40 mg total) by mouth daily.  . Vitamin A 2400 MCG (8000 UT) TABS Take 8,000 Units by mouth daily.  . vitamin C (ASCORBIC ACID) 500 MG tablet Take 500 mg by mouth every morning.  . Vitamin E 400 units TABS Take 400 Units by mouth daily.   No facility-administered encounter medications on file as of 09/29/2020.    Allergies (verified) Sulfa antibiotics   History: Past Medical History:  Diagnosis Date  . Hyperlipemia    Past Surgical History:  Procedure Laterality Date  . BREAST SURGERY    . CATARACT EXTRACTION W/PHACO Left 08/11/2019   Procedure: CATARACT EXTRACTION PHACO AND INTRAOCULAR LENS PLACEMENT (IOC) (CDE: 9.68);  Surgeon: [name], MD;  Location: [facility];  Service: Ophthalmology;  Laterality: Left;  . CATARACT  EXTRACTION W/PHACO Right 09/29/2019   Procedure: CATARACT EXTRACTION PHACO AND INTRAOCULAR LENS PLACEMENT RIGHT EYE;  Surgeon: [name], MD;  Location: [facility];  Service: Ophthalmology;  Laterality: Right;  CDE: 10.60  . DILITATION & CURRETTAGE/HYSTROSCOPY WITH ESSURE     No family history on file. Social History   Socioeconomic History  . Marital status: Married    Spouse name: Not on file  . Number of children: 1  . Years of education: Not on file  . Highest education level: Not on file  Occupational History  . Not on file  Tobacco Use  . Smoking status: Never Smoker  . Smokeless tobacco: Never Used  Substance and Sexual Activity  . Alcohol use: Never  . Drug use: Never  . Sexual activity: Not on file  Other Topics Concern  . Not on file  Social History Narrative   Lives with husband of 49 years   They have one child and one grandchild      She enjoys reading romance novels      Diet: all food groups   Caffeine: Limited   Water: 1/2 gallon or more a day      Wears seat belt   Does not use phone while driving   Psychologist, sport and exercise at home   Production assistant, radio    Social Determinants of Health   Financial Resource Strain: Low Risk   . Difficulty of Paying Living Expenses: Not  hard at all  Food Insecurity: No Food Insecurity  . Worried About Programme researcher, broadcasting/film/video in the Last Year: Never true  . Ran Out of Food in the Last Year: Never true  Transportation Needs: No Transportation Needs  . Lack of Transportation (Medical): No  . Lack of Transportation (Non-Medical): No  Physical Activity: Sufficiently Active  . Days of Exercise per Week: 7 days  . Minutes of Exercise per Session: 30 min  Stress: No Stress Concern Present  . Feeling of Stress : Not at all  Social Connections: Moderately Integrated  . Frequency of Communication with Friends and Family: More than three times a week  . Frequency of Social Gatherings with Friends and Family: Once a  week  . Attends Religious Services: More than 4 times per year  . Active Member of Clubs or Organizations: No  . Attends Banker Meetings: Never  . Marital Status: Married    Tobacco Counseling Counseling given: Not Answered   Clinical Intake:                 Diabetic? no         Activities of Daily Living In your present state of health, do you have any difficulty performing the following activities: 07/19/2020  Hearing? N  Vision? N  Difficulty concentrating or making decisions? N  Walking or climbing stairs? N  Dressing or bathing? N  Doing errands, shopping? N  Some recent data might be hidden    Patient Care Team: Freddy Finner, NP as PCP - General (Family Medicine)  Indicate any recent Medical Services you may have received from other than [facility] providers in the past year (date may be approximate).     Assessment:   This is a routine wellness examination for Stephanie Ramos.  Hearing/Vision screen No exam data present  Dietary issues and exercise activities discussed:    Goals   None    Depression Screen PHQ 2/9 Scores 09/28/2020 07/19/2020 06/21/2020 06/14/2020 03/18/2020 03/18/2020  PHQ - 2 Score 0 0 0 0 0 0    Fall Risk Fall Risk  09/28/2020 07/19/2020 06/21/2020 06/14/2020 03/18/2020  Falls in the past year? 0 0 0 0 0  Number falls in past yr: 0 0 0 - 0  Injury with Fall? 0 0 0 - 0  Risk for fall due to : No Fall Risks No Fall Risks No Fall Risks - No Fall Risks  Follow up Falls evaluation completed Falls evaluation completed Falls evaluation completed - Falls evaluation completed    FALL RISK PREVENTION PERTAINING TO THE HOME:  Any stairs in or around the home? No  If so, are there any without handrails? n/a Home free of loose throw rugs in walkways, pet beds, electrical cords, etc? Yes  Adequate lighting in your home to reduce risk of falls? Yes   ASSISTIVE DEVICES UTILIZED TO PREVENT FALLS:  Life alert? No  Use of a cane,  walker or w/c? No  Grab bars in the bathroom? No  Shower chair or bench in shower? No  Elevated toilet seat or a handicapped toilet? No   TIMED UP AND GO:  Was the test performed? No .  Length of time to ambulate n/a    Cognitive Function:        Immunizations  There is no immunization history on file for this patient.  TDAP status: Due, Education has been provided regarding the importance of this vaccine. Advised may receive this vaccine at local  pharmacy or Health Dept. Aware to provide a copy of the vaccination record if obtained from local pharmacy or Health Dept. Verbalized acceptance and understanding.  Flu Vaccine status: Declined, Education has been provided regarding the importance of this vaccine but patient still declined. Advised may receive this vaccine at local pharmacy or Health Dept. Aware to provide a copy of the vaccination record if obtained from local pharmacy or Health Dept. Verbalized acceptance and understanding.  Pneumococcal vaccine status: Declined,  Education has been provided regarding the importance of this vaccine but patient still declined. Advised may receive this vaccine at local pharmacy or Health Dept. Aware to provide a copy of the vaccination record if obtained from local pharmacy or Health Dept. Verbalized acceptance and understanding.   Covid-19 vaccine status: Declined, Education has been provided regarding the importance of this vaccine but patient still declined. Advised may receive this vaccine at local pharmacy or Health Dept.or vaccine clinic. Aware to provide a copy of the vaccination record if obtained from local pharmacy or Health Dept. Verbalized acceptance and understanding.  Qualifies for Shingles Vaccine? Yes   Zostavax completed No   Shingrix Completed?: No.    Education has been provided regarding the importance of this vaccine. Patient has been advised to call insurance company to determine out of pocket expense if they have not yet  received this vaccine. Advised may also receive vaccine at local pharmacy or Health Dept. Verbalized acceptance and understanding.  Screening Tests Health Maintenance  Topic Date Due  . COVID-19 Vaccine (1) Never done  . MAMMOGRAM  03/18/2021 (Originally 07/29/1996)  . COLONOSCOPY (Pts [id number] Insurance coverage will need to be confirmed)  03/18/2021 (Originally 07/30/1991)  . TETANUS/TDAP  03/18/2021 (Originally [birth year]29/1965)  . Hepatitis C Screening  03/18/2021 (Originally 1946/04/26)  . PNA vac Low Risk Adult (1 of 2 - PCV13) 03/18/2021 (Originally 07/30/2011)  . INFLUENZA VACCINE  01/03/2021  . DEXA SCAN  Completed  . HPV VACCINES  Aged Out    Health Maintenance  Health Maintenance Due  Topic Date Due  . COVID-19 Vaccine (1) Never done    Colorectal: Declined  Mammogram: Declined  Bone Dexa: Declined  Lung Cancer Screening: (Low Dose CT Chest recommended if Age 5-80 years, 30 pack-year currently smoking OR have quit w/in 15years.) does not qualify.   Lung Cancer Screening Referral: n/a  Additional Screening:  Hepatitis C Screening: does not qualify  Vision Screening: Recommended annual ophthalmology exams for early detection of glaucoma and other disorders of the eye. Is the patient up to date with their annual eye exam?  Yes  Who is the provider or what is the name of the office in which the patient attends annual eye exams[facility] [place] If pt is not established with a provider, would they like to be referred to a provider to establish care? n/a.   Dental Screening: Recommended annual dental exams for proper oral hygiene  Community Resource Referral / Chronic Care Management: CRR required this visit?  No   CCM required this visit?  No      Plan:     I have personally reviewed and noted the following in the patient's chart:   . Medical and social history . Use of alcohol, tobacco or illicit drugs  . Current medications and  supplements . Functional ability and status . Nutritional status . Physical activity . Advanced directives . List of other physicians . Hospitalizations, surgeries, and ER visits in previous 12 months . Vitals . Screenings  to include cognitive, depression, and falls . Referrals and appointments  In addition, I have reviewed and discussed with patient certain preventive protocols, quality metrics, and best practice recommendations. A written personalized care plan for preventive services as well as general preventive health recommendations were provided to patient.     Jerilynn Mages, [place]   7/78/2423   Nurse Notes: AWV completed by nurse in office by phone. Patient gave consent to telehealth visit via audio. Patient at home at the time of this visit. Provider in the office at the time of this visit. Visit took 30 minutes to complete.

## 2020-09-29 NOTE — Patient Instructions (Addendum)
Stephanie Ramos , Thank you for taking time to come for your Medicare Wellness Visit. I appreciate your ongoing commitment to your health goals. Please review the following plan we discussed and let me know if I can assist you in the future.   Screening recommendations/referrals: Colonoscopy: Declined Mammogram: Declined Bone Density: Declined Recommended yearly ophthalmology/optometry visit for glaucoma screening and checkup Recommended yearly dental visit for hygiene and checkup  Vaccinations: Influenza vaccine: Fall 2022 Pneumococcal vaccine: Declined Tdap vaccine: Declined Shingles vaccine: Declined  Advanced directives: POA  Conditions/risks identified: None  Next appointment: 01/18/21 @ 2pm   Preventive Care 65 Years and Older, Female Preventive care refers to lifestyle choices and visits with your health care provider that can promote health and wellness. What does preventive care include?  A yearly physical exam. This is also called an annual well check.  Dental exams once or twice a year.  Routine eye exams. Ask your health care provider how often you should have your eyes checked.  Personal lifestyle choices, including:  Daily care of your teeth and gums.  Regular physical activity.  Eating a healthy diet.  Avoiding tobacco and drug use.  Limiting alcohol use.  Practicing safe sex.  Taking low-dose aspirin every day.  Taking vitamin and mineral supplements as recommended by your health care provider. What happens during an annual well check? The services and screenings done by your health care provider during your annual well check will depend on your age, overall health, lifestyle risk factors, and family history of disease. Counseling  Your health care provider may ask you questions about your:  Alcohol use.  Tobacco use.  Drug use.  Emotional well-being.  Home and relationship well-being.  Sexual activity.  Eating habits.  History of  falls.  Memory and ability to understand (cognition).  Work and work Astronomer.  Reproductive health. Screening  You may have the following tests or measurements:  Height, weight, and BMI.  Blood pressure.  Lipid and cholesterol levels. These may be checked every 5 years, or more frequently if you are over 74 years old.  Skin check.  Lung cancer screening. You may have this screening every year starting at age 74 if you have a 30-pack-year history of smoking and currently smoke or have quit within the past 15 years.  Fecal occult blood test (FOBT) of the stool. You may have this test every year starting at age 74.  Flexible sigmoidoscopy or colonoscopy. You may have a sigmoidoscopy every 5 years or a colonoscopy every 10 years starting at age 74.  Hepatitis C blood test.  Hepatitis B blood test.  Sexually transmitted disease (STD) testing.  Diabetes screening. This is done by checking your blood sugar (glucose) after you have not eaten for a while (fasting). You may have this done every 1-3 years.  Bone density scan. This is done to screen for osteoporosis. You may have this done starting at age 74.  Mammogram. This may be done every 1-2 years. Talk to your health care provider about how often you should have regular mammograms. Talk with your health care provider about your test results, treatment options, and if necessary, the need for more tests. Vaccines  Your health care provider may recommend certain vaccines, such as:  Influenza vaccine. This is recommended every year.  Tetanus, diphtheria, and acellular pertussis (Tdap, Td) vaccine. You may need a Td booster every 10 years.  Zoster vaccine. You may need this after age 74.  Pneumococcal 13-valent conjugate (PCV13) vaccine.  One dose is recommended after age 74.  Pneumococcal polysaccharide (PPSV23) vaccine. One dose is recommended after age 74. Talk to your health care provider about which screenings and  vaccines you need and how often you need them. This information is not intended to replace advice given to you by your health care provider. Make sure you discuss any questions you have with your health care provider. Document Released: 06/18/2015 Document Revised: 02/09/2016 Document Reviewed: 03/23/2015 Elsevier Interactive Patient Education  2017 [place] Prevention in the Home Falls can cause injuries. They can happen to people of all ages. There are many things you can do to make your home safe and to help prevent falls. What can I do on the outside of my home?  Regularly fix the edges of walkways and driveways and fix any cracks.  Remove anything that might make you trip as you walk through a door, such as a raised step or threshold.  Trim any bushes or trees on the path to your home.  Use bright outdoor lighting.  Clear any walking paths of anything that might make someone trip, such as rocks or tools.  Regularly check to see if handrails are loose or broken. Make sure that both sides of any steps have handrails.  Any raised decks and porches should have guardrails on the edges.  Have any leaves, snow, or ice cleared regularly.  Use sand or salt on walking paths during winter.  Clean up any spills in your garage right away. This includes oil or grease spills. What can I do in the bathroom?  Use night lights.  Install grab bars by the toilet and in the tub and shower. Do not use towel bars as grab bars.  Use non-skid mats or decals in the tub or shower.  If you need to sit down in the shower, use a plastic, non-slip stool.  Keep the floor dry. Clean up any water that spills on the floor as soon as it happens.  Remove soap buildup in the tub or shower regularly.  Attach bath mats securely with double-sided non-slip rug tape.  Do not have throw rugs and other things on the floor that can make you trip. What can I do in the bedroom?  Use night  lights.  Make sure that you have a light by your bed that is easy to reach.  Do not use any sheets or blankets that are too big for your bed. They should not hang down onto the floor.  Have a firm chair that has side arms. You can use this for support while you get dressed.  Do not have throw rugs and other things on the floor that can make you trip. What can I do in the kitchen?  Clean up any spills right away.  Avoid walking on wet floors.  Keep items that you use a lot in easy-to-reach places.  If you need to reach something above you, use a strong step stool that has a grab bar.  Keep electrical cords out of the way.  Do not use floor polish or wax that makes floors slippery. If you must use wax, use non-skid floor wax.  Do not have throw rugs and other things on the floor that can make you trip. What can I do with my stairs?  Do not leave any items on the stairs.  Make sure that there are handrails on both sides of the stairs and use them. Fix handrails that are broken  or loose. Make sure that handrails are as long as the stairways.  Check any carpeting to make sure that it is firmly attached to the stairs. Fix any carpet that is loose or worn.  Avoid having throw rugs at the top or bottom of the stairs. If you do have throw rugs, attach them to the floor with carpet tape.  Make sure that you have a light switch at the top of the stairs and the bottom of the stairs. If you do not have them, ask someone to add them for you. What else can I do to help prevent falls?  Wear shoes that:  Do not have high heels.  Have rubber bottoms.  Are comfortable and fit you well.  Are closed at the toe. Do not wear sandals.  If you use a stepladder:  Make sure that it is fully opened. Do not climb a closed stepladder.  Make sure that both sides of the stepladder are locked into place.  Ask someone to hold it for you, if possible.  Clearly mark and make sure that you can  see:  Any grab bars or handrails.  First and last steps.  Where the edge of each step is.  Use tools that help you move around (mobility aids) if they are needed. These include:  Canes.  Walkers.  Scooters.  Crutches.  Turn on the lights when you go into a dark area. Replace any light bulbs as soon as they burn out.  Set up your furniture so you have a clear path. Avoid moving your furniture around.  If any of your floors are uneven, fix them.  If there are any pets around you, be aware of where they are.  Review your medicines with your doctor. Some medicines can make you feel dizzy. This can increase your chance of falling. Ask your doctor what other things that you can do to help prevent falls. This information is not intended to replace advice given to you by your health care provider. Make sure you discuss any questions you have with your health care provider. Document Released: 03/18/2009 Document Revised: 10/28/2015 Document Reviewed: 06/26/2014 Elsevier Interactive Patient Education  2017 [employer].

## 2020-09-30 LAB — URINE CULTURE

## 2020-10-04 ENCOUNTER — Other Ambulatory Visit: Payer: Self-pay | Admitting: Family Medicine

## 2020-10-04 DIAGNOSIS — E7841 Elevated Lipoprotein(a): Secondary | ICD-10-CM

## 2020-12-30 ENCOUNTER — Other Ambulatory Visit: Payer: Self-pay | Admitting: Nurse Practitioner

## 2020-12-30 DIAGNOSIS — E7841 Elevated Lipoprotein(a): Secondary | ICD-10-CM

## 2021-01-18 ENCOUNTER — Ambulatory Visit (INDEPENDENT_AMBULATORY_CARE_PROVIDER_SITE_OTHER): Payer: Medicare HMO | Admitting: Nurse Practitioner

## 2021-01-18 ENCOUNTER — Encounter: Payer: Self-pay | Admitting: Nurse Practitioner

## 2021-01-18 ENCOUNTER — Ambulatory Visit: Payer: Medicare HMO | Admitting: Family Medicine

## 2021-01-18 ENCOUNTER — Other Ambulatory Visit: Payer: Self-pay

## 2021-01-18 VITALS — BP 156/80 | HR 90 | Temp 98.1°F | Ht 61.0 in | Wt 140.0 lb

## 2021-01-18 DIAGNOSIS — E611 Iron deficiency: Secondary | ICD-10-CM | POA: Insufficient documentation

## 2021-01-18 DIAGNOSIS — R030 Elevated blood-pressure reading, without diagnosis of hypertension: Secondary | ICD-10-CM | POA: Diagnosis not present

## 2021-01-18 DIAGNOSIS — E7841 Elevated Lipoprotein(a): Secondary | ICD-10-CM | POA: Diagnosis not present

## 2021-01-18 NOTE — Assessment & Plan Note (Signed)
BP Readings from Last 3 Encounters:  01/18/21 (!) 156/80  03/18/20 136/70  09/29/19 (!) 141/64   -she states she gets anxious when she comes into the doctor's office -she will check home BP or at least use store cuff when out and about -goal BP < 140/90

## 2021-01-18 NOTE — Assessment & Plan Note (Signed)
-  checking lipid panel with labs 

## 2021-01-18 NOTE — Progress Notes (Signed)
Established Patient Office Visit  Subjective:  Patient ID: Stephanie Ramos, female    DOB: 05-25-1946  Age: 75 y.o. MRN: [id number]  CC:  Chief Complaint  Patient presents with   Follow-up    Has not had labs done yet    HPI Stephanie Ramos presents for lab follow-up. She has hx of HLD and has been taking simvastatin without side effects.  She has hx of iron deficiency, and takes daily iron supplement.  Past Medical History:  Diagnosis Date   Hyperlipemia     Past Surgical History:  Procedure Laterality Date   BREAST SURGERY     CATARACT EXTRACTION W/PHACO Left 08/11/2019   Procedure: CATARACT EXTRACTION PHACO AND INTRAOCULAR LENS PLACEMENT (IOC) (CDE: 9.68);  Surgeon: Baruch Goldmann, MD;  Location: [facility];  Service: Ophthalmology;  Laterality: Left;   CATARACT EXTRACTION W/PHACO Right 09/29/2019   Procedure: CATARACT EXTRACTION PHACO AND INTRAOCULAR LENS PLACEMENT RIGHT EYE;  Surgeon: Baruch Goldmann, MD;  Location: [facility];  Service: Ophthalmology;  Laterality: Right;  CDE: 10.60   DILITATION & CURRETTAGE/HYSTROSCOPY WITH ESSURE      History reviewed. No pertinent family history.  Social History   Socioeconomic History   Marital status: Married    Spouse name: Not on file   Number of children: 1   Years of education: Not on file   Highest education level: Not on file  Occupational History   Not on file  Tobacco Use   Smoking status: Never   Smokeless tobacco: Never  Substance and Sexual Activity   Alcohol use: Never   Drug use: Never   Sexual activity: Not on file  Other Topics Concern   Not on file  Social History Narrative   Lives with husband of 59 years   They have one child and one grandchild      She enjoys reading romance novels      Diet: all food groups   Caffeine: Limited   Water: 1/2 gallon or more a day      Wears seat belt   Does not use phone while driving   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     Social Determinants of Health   Financial Resource Strain: Low Risk    Difficulty of Paying Living Expenses: Not hard at all  Food Insecurity: No Food Insecurity   Worried About Charity fundraiser in the Last Year: Never true   Arboriculturist in the Last Year: Never true  Transportation Needs: No Transportation Needs   Lack of Transportation (Medical): No   Lack of Transportation (Non-Medical): No  Physical Activity: Sufficiently Active   Days of Exercise per Week: 6 days   Minutes of Exercise per Session: 30 min  Stress: No Stress Concern Present   Feeling of Stress : Not at all  Social Connections: Moderately Integrated   Frequency of Communication with Friends and Family: More than three times a week   Frequency of Social Gatherings with Friends and Family: More than three times a week   Attends Religious Services: More than 4 times per year   Active Member of [employer] or Organizations: No   Attends Archivist Meetings: Never   Marital Status: Married  Human resources officer Violence: Not At Risk   Fear of Current or Ex-Partner: No   Emotionally Abused: No   Physically Abused: No   Sexually Abused: No    Outpatient Medications Prior  to Visit  Medication Sig Dispense Refill   Calcium Carb-Cholecalciferol 600-800 MG-UNIT TABS Take 1 tablet by mouth in the morning and at bedtime.     cyanocobalamin 1000 MCG tablet Take 1,000 mcg by mouth daily.     ferrous sulfate 325 (65 FE) MG tablet Take 325 mg by mouth every morning.     meclizine (ANTIVERT) 25 MG tablet Take 1 tablet (25 mg total) by mouth 3 (three) times daily as needed for dizziness. 30 tablet 2   Multiple Vitamin (MULTIVITAMIN WITH MINERALS) TABS tablet Take 1 tablet by mouth every morning.     omega-3 acid ethyl esters (LOVAZA) 1 g capsule Take by mouth 2 (two) times daily.     simvastatin (ZOCOR) 40 MG tablet Take 1 tablet by mouth once daily 90 tablet 0   Vitamin A 2400 MCG (8000 UT) TABS Take 8,000 Units by  mouth daily.     vitamin C (ASCORBIC ACID) 500 MG tablet Take 500 mg by mouth every morning.     Vitamin E 400 units TABS Take 400 Units by mouth daily.     No facility-administered medications prior to visit.    Allergies  Allergen Reactions   Sulfa Antibiotics Rash    ROS Review of Systems  Constitutional: Negative.   Respiratory: Negative.    Cardiovascular: Negative.   Musculoskeletal: Negative.   Psychiatric/Behavioral: Negative.       Objective:    Physical Exam Constitutional:      Appearance: Normal appearance.  Cardiovascular:     Rate and Rhythm: Normal rate and regular rhythm.     Pulses: Normal pulses.     Heart sounds: Normal heart sounds.  Pulmonary:     Effort: Pulmonary effort is normal.     Breath sounds: Normal breath sounds.  Musculoskeletal:        General: Normal range of motion.  Neurological:     Mental Status: She is alert.  Psychiatric:        Mood and Affect: Mood normal.        Behavior: Behavior normal.        Thought Content: Thought content normal.        Judgment: Judgment normal.    BP (!) 156/80 (BP Location: Left Arm, Patient Position: Sitting, Cuff Size: Normal)   Pulse 90   Temp 98.1 F (36.7 C) (Oral)   Ht 5' 1"  (1.549 m)   Wt 140 lb (63.5 kg)   SpO2 98%   BMI 26.45 kg/m  Wt Readings from Last 3 Encounters:  01/18/21 140 lb (63.5 kg)  09/29/20 137 lb (62.1 kg)  06/21/20 137 lb (62.1 kg)     Health Maintenance Due  Topic Date Due   INFLUENZA VACCINE  01/03/2021    There are no preventive care reminders to display for this patient.  No results found for: TSH Lab Results  Component Value Date   WBC 6.8 12/29/2017   HGB 14.1 12/29/2017   HCT 44.0 12/29/2017   MCV 102.1 (H) 12/29/2017   PLT 390 12/29/2017   Lab Results  Component Value Date   NA 143 07/19/2020   K 4.6 07/19/2020   CO2 22 07/19/2020   GLUCOSE 90 07/19/2020   BUN 16 07/19/2020   CREATININE 0.73 07/19/2020   BILITOT 0.5 12/28/2017    ALKPHOS 71 12/28/2017   AST 19 12/28/2017   ALT 20 12/28/2017   PROT 7.6 12/28/2017   ALBUMIN 4.1 12/28/2017   CALCIUM 9.6 07/19/2020  ANIONGAP 7 12/29/2017   Lab Results  Component Value Date   CHOL 206 (H) 07/19/2020   Lab Results  Component Value Date   HDL 70 07/19/2020   Lab Results  Component Value Date   LDLCALC 120 (H) 07/19/2020   Lab Results  Component Value Date   TRIG 93 07/19/2020   Lab Results  Component Value Date   CHOLHDL 2.9 07/19/2020   Lab Results  Component Value Date   HGBA1C 5.5 12/28/2017      Assessment & Plan:   Problem List Items Addressed This Visit       Other   Hyperlipemia - Primary (Chronic)    -checking lipid panel with labs      Relevant Orders   CMP14+EGFR   Lipid Panel With LDL/HDL Ratio   CBC with Differential/Platelet   CMP14+EGFR   Lipid Panel With LDL/HDL Ratio   Iron deficiency    -takes daily iron supplement -will check iron panel with labs      Relevant Orders   CBC with Differential/Platelet   Iron, TIBC and Ferritin Panel   Elevated BP without diagnosis of hypertension    BP Readings from Last 3 Encounters:  01/18/21 (!) 156/80  03/18/20 136/70  09/29/19 (!) 141/64  -she states she gets anxious when she comes into the doctor's office -she will check home BP or at least use store cuff when out and about -goal BP < 140/90      Relevant Orders   CBC with Differential/Platelet   CMP14+EGFR   Lipid Panel With LDL/HDL Ratio   CBC with Differential/Platelet   CMP14+EGFR   Lipid Panel With LDL/HDL Ratio    No orders of the defined types were placed in this encounter.   Follow-up: Return in about 6 months (around 07/21/2021) for Lab follow-up (HLD).    Noreene Larsson, NP

## 2021-01-18 NOTE — Assessment & Plan Note (Signed)
-  takes daily iron supplement -will check iron panel with labs

## 2021-01-18 NOTE — Patient Instructions (Signed)
Please have fasting labs drawn in the next 7 days.  We will meet up again in 6 months for a lab follow-up. Please have fasting labs drawn 2-3 days prior to your appointment so we can discuss the results during your office visit.

## 2021-01-19 DIAGNOSIS — R030 Elevated blood-pressure reading, without diagnosis of hypertension: Secondary | ICD-10-CM | POA: Diagnosis not present

## 2021-01-19 DIAGNOSIS — E7841 Elevated Lipoprotein(a): Secondary | ICD-10-CM | POA: Diagnosis not present

## 2021-01-19 DIAGNOSIS — E611 Iron deficiency: Secondary | ICD-10-CM | POA: Diagnosis not present

## 2021-01-20 ENCOUNTER — Other Ambulatory Visit: Payer: Self-pay | Admitting: Nurse Practitioner

## 2021-01-20 DIAGNOSIS — R7989 Other specified abnormal findings of blood chemistry: Secondary | ICD-10-CM

## 2021-01-20 LAB — CBC WITH DIFFERENTIAL/PLATELET
Basophils Absolute: 0.1 10*3/uL (ref 0.0–0.2)
Basos: 1 %
EOS (ABSOLUTE): 0.2 10*3/uL (ref 0.0–0.4)
Eos: 3 %
Hematocrit: 46.1 % (ref 34.0–46.6)
Hemoglobin: 15.1 g/dL (ref 11.1–15.9)
Immature Grans (Abs): 0 10*3/uL (ref 0.0–0.1)
Immature Granulocytes: 0 %
Lymphocytes Absolute: 2.1 10*3/uL (ref 0.7–3.1)
Lymphs: 33 %
MCH: 32.1 pg (ref 26.6–33.0)
MCHC: 32.8 g/dL (ref 31.5–35.7)
MCV: 98 fL — ABNORMAL HIGH (ref 79–97)
Monocytes Absolute: 0.5 10*3/uL (ref 0.1–0.9)
Monocytes: 8 %
Neutrophils Absolute: 3.5 10*3/uL (ref 1.4–7.0)
Neutrophils: 55 %
Platelets: 386 10*3/uL (ref 150–450)
RBC: 4.7 x10E6/uL (ref 3.77–5.28)
RDW: 11.7 % (ref 11.7–15.4)
WBC: 6.4 10*3/uL (ref 3.4–10.8)

## 2021-01-20 LAB — CMP14+EGFR
ALT: 25 IU/L (ref 0–32)
AST: 24 IU/L (ref 0–40)
Albumin/Globulin Ratio: 2.2 (ref 1.2–2.2)
Albumin: 4.7 g/dL (ref 3.7–4.7)
Alkaline Phosphatase: 81 IU/L (ref 44–121)
BUN/Creatinine Ratio: 19 (ref 12–28)
BUN: 14 mg/dL (ref 8–27)
Bilirubin Total: 0.2 mg/dL (ref 0.0–1.2)
CO2: 22 mmol/L (ref 20–29)
Calcium: 9.7 mg/dL (ref 8.7–10.3)
Chloride: 100 mmol/L (ref 96–106)
Creatinine, Ser: 0.74 mg/dL (ref 0.57–1.00)
Globulin, Total: 2.1 g/dL (ref 1.5–4.5)
Glucose: 92 mg/dL (ref 65–99)
Potassium: 4.7 mmol/L (ref 3.5–5.2)
Sodium: 141 mmol/L (ref 134–144)
Total Protein: 6.8 g/dL (ref 6.0–8.5)
eGFR: 85 mL/min/{1.73_m2} (ref >59)

## 2021-01-20 LAB — LIPID PANEL WITH LDL/HDL RATIO
Cholesterol, Total: 176 mg/dL (ref 100–199)
HDL: 65 mg/dL (ref >39)
LDL Chol Calc (NIH): 93 mg/dL (ref 0–99)
LDL/HDL Ratio: 1.4 ratio (ref 0.0–3.2)
Triglycerides: 97 mg/dL (ref 0–149)
VLDL Cholesterol Cal: 18 mg/dL (ref 5–40)

## 2021-01-20 LAB — IRON,TIBC AND FERRITIN PANEL
Ferritin: 1608 ng/mL — ABNORMAL HIGH (ref 15–150)
Iron Saturation: 33 % (ref 15–55)
Iron: 88 ug/dL (ref 27–139)
Total Iron Binding Capacity: 267 ug/dL (ref 250–450)
UIBC: 179 ug/dL (ref 118–369)

## 2021-01-20 NOTE — Progress Notes (Signed)
Let's recheck her labs in 1 month. I'll put the labs in.

## 2021-01-20 NOTE — Progress Notes (Signed)
Her ferritin is significantly elevated. She can stop her iron supplement.

## 2021-02-21 DIAGNOSIS — R7989 Other specified abnormal findings of blood chemistry: Secondary | ICD-10-CM | POA: Diagnosis not present

## 2021-02-22 ENCOUNTER — Other Ambulatory Visit: Payer: Self-pay | Admitting: Nurse Practitioner

## 2021-02-22 DIAGNOSIS — R7989 Other specified abnormal findings of blood chemistry: Secondary | ICD-10-CM

## 2021-02-22 LAB — IRON,TIBC AND FERRITIN PANEL
Ferritin: 1392 ng/mL — ABNORMAL HIGH (ref 15–150)
Iron Saturation: 38 % (ref 15–55)
Iron: 106 ug/dL (ref 27–139)
Total Iron Binding Capacity: 278 ug/dL (ref 250–450)
UIBC: 172 ug/dL (ref 118–369)

## 2021-02-22 LAB — CBC WITH DIFFERENTIAL/PLATELET
Basophils Absolute: 0.1 10*3/uL (ref 0.0–0.2)
Basos: 1 %
EOS (ABSOLUTE): 0.1 10*3/uL (ref 0.0–0.4)
Eos: 2 %
Hematocrit: 44.6 % (ref 34.0–46.6)
Hemoglobin: 14.6 g/dL (ref 11.1–15.9)
Immature Grans (Abs): 0 10*3/uL (ref 0.0–0.1)
Immature Granulocytes: 0 %
Lymphocytes Absolute: 1.9 10*3/uL (ref 0.7–3.1)
Lymphs: 30 %
MCH: 32 pg (ref 26.6–33.0)
MCHC: 32.7 g/dL (ref 31.5–35.7)
MCV: 98 fL — ABNORMAL HIGH (ref 79–97)
Monocytes Absolute: 0.6 10*3/uL (ref 0.1–0.9)
Monocytes: 9 %
Neutrophils Absolute: 3.7 10*3/uL (ref 1.4–7.0)
Neutrophils: 58 %
Platelets: 372 10*3/uL (ref 150–450)
RBC: 4.56 x10E6/uL (ref 3.77–5.28)
RDW: 11.7 % (ref 11.7–15.4)
WBC: 6.3 10*3/uL (ref 3.4–10.8)

## 2021-02-22 NOTE — Progress Notes (Signed)
Her ferritin is trending down. It was down 200 points in a month, so we should recheck it in 3 months. I'll put in the labs, please set up an appt for lab f/u.

## 2021-02-22 NOTE — Progress Notes (Signed)
-  pt had been taking iron supplement for a long time, but ferritin level is supertherapeutic -she stopped iron a month ago, and labs show a decrease in ferritin from previous levels -recheck labs in 3 months

## 2021-04-06 ENCOUNTER — Other Ambulatory Visit: Payer: Self-pay | Admitting: Nurse Practitioner

## 2021-04-06 DIAGNOSIS — E7841 Elevated Lipoprotein(a): Secondary | ICD-10-CM

## 2021-05-16 DIAGNOSIS — R7989 Other specified abnormal findings of blood chemistry: Secondary | ICD-10-CM | POA: Diagnosis not present

## 2021-05-17 NOTE — Progress Notes (Signed)
Labs look OK. Albumin is slightly elevated, but we will recheck that with routine labs in February,

## 2021-05-18 ENCOUNTER — Other Ambulatory Visit: Payer: Self-pay | Admitting: Nurse Practitioner

## 2021-05-18 ENCOUNTER — Telehealth: Payer: Self-pay

## 2021-05-18 DIAGNOSIS — R7989 Other specified abnormal findings of blood chemistry: Secondary | ICD-10-CM

## 2021-05-18 LAB — CBC WITH DIFFERENTIAL/PLATELET
Basophils Absolute: 0.1 10*3/uL (ref 0.0–0.2)
Basos: 1 %
EOS (ABSOLUTE): 0.1 10*3/uL (ref 0.0–0.4)
Eos: 2 %
Hematocrit: 45.7 % (ref 34.0–46.6)
Hemoglobin: 15 g/dL (ref 11.1–15.9)
Immature Grans (Abs): 0 10*3/uL (ref 0.0–0.1)
Immature Granulocytes: 0 %
Lymphocytes Absolute: 2 10*3/uL (ref 0.7–3.1)
Lymphs: 31 %
MCH: 31.3 pg (ref 26.6–33.0)
MCHC: 32.8 g/dL (ref 31.5–35.7)
MCV: 95 fL (ref 79–97)
Monocytes Absolute: 0.5 10*3/uL (ref 0.1–0.9)
Monocytes: 8 %
Neutrophils Absolute: 3.9 10*3/uL (ref 1.4–7.0)
Neutrophils: 58 %
Platelets: 414 10*3/uL (ref 150–450)
RBC: 4.79 x10E6/uL (ref 3.77–5.28)
RDW: 12.7 % (ref 11.7–15.4)
WBC: 6.6 10*3/uL (ref 3.4–10.8)

## 2021-05-18 LAB — CMP14+EGFR
ALT: 21 IU/L (ref 0–32)
AST: 21 IU/L (ref 0–40)
Albumin/Globulin Ratio: 2.4 — ABNORMAL HIGH (ref 1.2–2.2)
Albumin: 5 g/dL — ABNORMAL HIGH (ref 3.7–4.7)
Alkaline Phosphatase: 73 IU/L (ref 44–121)
BUN/Creatinine Ratio: 27 (ref 12–28)
BUN: 17 mg/dL (ref 8–27)
Bilirubin Total: 0.2 mg/dL (ref 0.0–1.2)
CO2: 22 mmol/L (ref 20–29)
Calcium: 9.8 mg/dL (ref 8.7–10.3)
Chloride: 101 mmol/L (ref 96–106)
Creatinine, Ser: 0.63 mg/dL (ref 0.57–1.00)
Globulin, Total: 2.1 g/dL (ref 1.5–4.5)
Glucose: 94 mg/dL (ref 70–99)
Potassium: 4.7 mmol/L (ref 3.5–5.2)
Sodium: 139 mmol/L (ref 134–144)
Total Protein: 7.1 g/dL (ref 6.0–8.5)
eGFR: 93 mL/min/{1.73_m2} (ref >59)

## 2021-05-18 LAB — IRON,TIBC AND FERRITIN PANEL
Ferritin: 1875 ng/mL — ABNORMAL HIGH (ref 15–150)
Iron Saturation: 30 % (ref 15–55)
Iron: 89 ug/dL (ref 27–139)
Total Iron Binding Capacity: 301 ug/dL (ref 250–450)
UIBC: 212 ug/dL (ref 118–369)

## 2021-05-18 NOTE — Progress Notes (Signed)
Ferritin is still elevated despite [place] stopping it previously. I sent in a referral to hematology to determine why ferritin is still going up.

## 2021-05-18 NOTE — Telephone Encounter (Signed)
Patient called asked for a nurse to give her a call back about lab results.

## 2021-05-18 NOTE — Progress Notes (Signed)
-  stopped iron previously d/t elevated ferritin, but it is going back up today -referral to hematology

## 2021-05-20 ENCOUNTER — Encounter: Payer: Self-pay | Admitting: Nurse Practitioner

## 2021-05-20 ENCOUNTER — Encounter (INDEPENDENT_AMBULATORY_CARE_PROVIDER_SITE_OTHER): Payer: Self-pay

## 2021-05-20 ENCOUNTER — Ambulatory Visit (INDEPENDENT_AMBULATORY_CARE_PROVIDER_SITE_OTHER): Payer: Medicare HMO | Admitting: Nurse Practitioner

## 2021-05-20 ENCOUNTER — Other Ambulatory Visit: Payer: Self-pay

## 2021-05-20 VITALS — BP 170/85 | HR 96 | Ht 61.0 in | Wt 134.1 lb

## 2021-05-20 DIAGNOSIS — R5383 Other fatigue: Secondary | ICD-10-CM | POA: Diagnosis not present

## 2021-05-20 DIAGNOSIS — G4700 Insomnia, unspecified: Secondary | ICD-10-CM | POA: Diagnosis not present

## 2021-05-20 DIAGNOSIS — E611 Iron deficiency: Secondary | ICD-10-CM

## 2021-05-20 MED ORDER — TRAZODONE HCL 50 MG PO TABS: 25.0000 mg | ORAL_TABLET | Freq: Every evening | ORAL | 3 refills | Status: DC | PRN

## 2021-05-20 NOTE — Assessment & Plan Note (Addendum)
-  unsure of etiology; referred to hematology d/t elevated ferritin; today albumin is slightly increased -will check TSH, LDH, autoimmune panel as well as B12 Wt Readings from Last 10 Encounters:  05/20/21 134 lb 1.9 oz (60.8 kg)  01/18/21 140 lb (63.5 kg)  09/29/20 137 lb (62.1 kg)  06/21/20 137 lb (62.1 kg)  06/14/20 137 lb (62.1 kg)  03/18/20 142 lb (64.4 kg)  08/11/19 149 lb (67.6 kg)  12/28/17 158 lb (71.7 kg)   -has lost 24 pounds in the last 2 years, but states she changed her diet after her husband had a heart attack; low sodium now -will treat for insomnia today, but unsure of insomnia alone accounts for fatigue since she states her sleep pattern is unchanged for many years

## 2021-05-20 NOTE — Assessment & Plan Note (Signed)
-  at a previous OV, she had been taking iron supplementation and her ferritin was well above normal limits, so we stopped the iron supplement -after stopping iron, her ferritin trended down -on today's labs, her ferritin is above the previous, and she denies recent illness, so less likely an acute phase reactant -referral to hematology was placed when labs resulted

## 2021-05-20 NOTE — Assessment & Plan Note (Signed)
-  sleeps 4-5 hours per night -Rx. trazodone

## 2021-05-20 NOTE — Progress Notes (Signed)
Acute Office Visit  Subjective:    Patient ID: Stephanie Ramos, female    DOB: Mar 24, 1946, 75 y.o.   MRN: [id number]  Chief Complaint  Patient presents with   Fatigue    Fatigue x 2 months     HPI Patient is in today for fatigue that has been ongoing for 2 months. She states that she doesn't sleep well at night, and she only gets 4-5 hours of sleep at night and she doesn't get any naps during the day.  She states that when she breathes, occasionally she "gets a little puff". She describes it as like involuntarily exhaling.  She states she changes her diet recently because her husband had a heart attack recently, and she is on a low sodium diet now. She is down 6 pounds since her last visit. She denies joint pain  Past Medical History:  Diagnosis Date   Hyperlipemia     Past Surgical History:  Procedure Laterality Date   BREAST SURGERY     CATARACT EXTRACTION W/PHACO Left 08/11/2019   Procedure: CATARACT EXTRACTION PHACO AND INTRAOCULAR LENS PLACEMENT (IOC) (CDE: 9.68);  Surgeon: Baruch Goldmann, MD;  Location: [facility];  Service: Ophthalmology;  Laterality: Left;   CATARACT EXTRACTION W/PHACO Right 09/29/2019   Procedure: CATARACT EXTRACTION PHACO AND INTRAOCULAR LENS PLACEMENT RIGHT EYE;  Surgeon: Baruch Goldmann, MD;  Location: [facility];  Service: Ophthalmology;  Laterality: Right;  CDE: 10.60   DILITATION & CURRETTAGE/HYSTROSCOPY WITH ESSURE      History reviewed. No pertinent family history.  Social History   Socioeconomic History   Marital status: Married    Spouse name: Not on file   Number of children: 1   Years of education: Not on file   Highest education level: Not on file  Occupational History   Not on file  Tobacco Use   Smoking status: Never   Smokeless tobacco: Never  Substance and Sexual Activity   Alcohol use: Never   Drug use: Never   Sexual activity: Not on file  Other Topics Concern   Not on file  Social History Narrative   Lives with husband  of 72 years   They have one child and one grandchild      She enjoys reading romance novels      Diet: all food groups   Caffeine: Limited   Water: 1/2 gallon or more a day      Wears seat belt   Does not use phone while driving   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    Social Determinants of Health   Financial Resource Strain: Low Risk    Difficulty of Paying Living Expenses: Not hard at all  Food Insecurity: No Food Insecurity   Worried About Charity fundraiser in the Last Year: Never true   Arboriculturist in the Last Year: Never true  Transportation Needs: No Transportation Needs   Lack of Transportation (Medical): No   Lack of Transportation (Non-Medical): No  Physical Activity: Sufficiently Active   Days of Exercise per Week: 6 days   Minutes of Exercise per Session: 30 min  Stress: No Stress Concern Present   Feeling of Stress : Not at all  Social Connections: Moderately Integrated   Frequency of Communication with Friends and Family: More than three times a week   Frequency of Social Gatherings with Friends and Family: More than three times a week   Attends Religious  Services: More than 4 times per year   Active Member of Clubs or Organizations: No   Attends Archivist Meetings: Never   Marital Status: Married  Human resources officer Violence: Not At Risk   Fear of Current or Ex-Partner: No   Emotionally Abused: No   Physically Abused: No   Sexually Abused: No    Outpatient Medications Prior to Visit  Medication Sig Dispense Refill   Calcium Carb-Cholecalciferol 600-800 MG-UNIT TABS Take 1 tablet by mouth in the morning and at bedtime.     cyanocobalamin 1000 MCG tablet Take 1,000 mcg by mouth daily.     meclizine (ANTIVERT) 25 MG tablet Take 1 tablet (25 mg total) by mouth 3 (three) times daily as needed for dizziness. 30 tablet 2   omega-3 acid ethyl esters (LOVAZA) 1 g capsule Take by mouth 2 (two) times daily.      simvastatin (ZOCOR) 40 MG tablet Take 1 tablet by mouth once daily 90 tablet 0   Vitamin A 2400 MCG (8000 UT) TABS Take 8,000 Units by mouth daily.     vitamin C (ASCORBIC ACID) 500 MG tablet Take 500 mg by mouth every morning.     Vitamin E 400 units TABS Take 400 Units by mouth daily.     Multiple Vitamin (MULTIVITAMIN WITH MINERALS) TABS tablet Take 1 tablet by mouth every morning.     No facility-administered medications prior to visit.    Allergies  Allergen Reactions   Sulfa Antibiotics Rash    Review of Systems  Constitutional:  Positive for fatigue.  Respiratory: Negative.         Occasional involuntary exhalation  Cardiovascular: Negative.   Musculoskeletal: Negative.   Psychiatric/Behavioral: Negative.        Objective:    Physical Exam Constitutional:      Appearance: Normal appearance.  Cardiovascular:     Rate and Rhythm: Normal rate and regular rhythm.     Pulses: Normal pulses.     Heart sounds: Normal heart sounds.  Pulmonary:     Effort: Pulmonary effort is normal.     Breath sounds: Normal breath sounds.  Neurological:     General: No focal deficit present.     Mental Status: She is alert and oriented to person, place, and time.    BP (!) 170/85    Pulse 96    Ht 5' 1"  (1.549 m)    Wt 134 lb 1.9 oz (60.8 kg)    SpO2 98%    BMI 25.34 kg/m  Wt Readings from Last 3 Encounters:  05/20/21 134 lb 1.9 oz (60.8 kg)  01/18/21 140 lb (63.5 kg)  09/29/20 137 lb (62.1 kg)    Health Maintenance Due  Topic Date Due   Pneumonia Vaccine 44+ Years old (1 - PCV) Never done   Hepatitis C Screening  Never done   TETANUS/TDAP  Never done   Zoster Vaccines- Shingrix (1 of 2) Never done   COLONOSCOPY (Pts 45-14yr Insurance coverage will need to be confirmed)  Never done   MAMMOGRAM  Never done    There are no preventive care reminders to display for this patient.   No results found for: TSH Lab Results  Component Value Date   WBC 6.6 05/16/2021   HGB 15.0  05/16/2021   HCT 45.7 05/16/2021   MCV 95 05/16/2021   PLT 414 05/16/2021   Lab Results  Component Value Date   NA 139 05/16/2021   K 4.7 05/16/2021  CO2 22 05/16/2021   GLUCOSE 94 05/16/2021   BUN 17 05/16/2021   CREATININE 0.63 05/16/2021   BILITOT 0.2 05/16/2021   ALKPHOS 73 05/16/2021   AST 21 05/16/2021   ALT 21 05/16/2021   PROT 7.1 05/16/2021   ALBUMIN 5.0 (H) 05/16/2021   CALCIUM 9.8 05/16/2021   ANIONGAP 7 12/29/2017   EGFR 93 05/16/2021   Lab Results  Component Value Date   CHOL 176 01/19/2021   Lab Results  Component Value Date   HDL 65 01/19/2021   Lab Results  Component Value Date   LDLCALC 93 01/19/2021   Lab Results  Component Value Date   TRIG 97 01/19/2021   Lab Results  Component Value Date   CHOLHDL 2.9 07/19/2020   Lab Results  Component Value Date   HGBA1C 5.5 12/28/2017       Assessment & Plan:   Problem List Items Addressed This Visit       Other   Iron deficiency    -at a previous OV, she had been taking iron supplementation and her ferritin was well above normal limits, so we stopped the iron supplement -after stopping iron, her ferritin trended down -on today's labs, her ferritin is above the previous, and she denies recent illness, so less likely an acute phase reactant -referral to hematology was placed when labs resulted      Fatigue - Primary    -unsure of etiology; referred to hematology d/t elevated ferritin; today albumin is slightly increased -will check TSH, LDH, autoimmune panel as well as B12 Wt Readings from Last 10 Encounters:  05/20/21 134 lb 1.9 oz (60.8 kg)  01/18/21 140 lb (63.5 kg)  09/29/20 137 lb (62.1 kg)  06/21/20 137 lb (62.1 kg)  06/14/20 137 lb (62.1 kg)  03/18/20 142 lb (64.4 kg)  08/11/19 149 lb (67.6 kg)  12/28/17 158 lb (71.7 kg)   -has lost 24 pounds in the last 2 years, but states she changed her diet after her husband had a heart attack; low sodium now -will treat for insomnia today,  but unsure of insomnia alone accounts for fatigue since she states her sleep pattern is unchanged for many years      Relevant Orders   TSH   B12   Rheumatoid Factor   Antinuclear Antib (ANA)   Sed Rate (ESR)   C-reactive protein   Lactate dehydrogenase   Insomnia    -sleeps 4-5 hours per night -Rx. trazodone      Relevant Medications   traZODone (DESYREL) 50 MG tablet     Meds ordered this encounter  Medications   traZODone (DESYREL) 50 MG tablet    Sig: Take 0.5-1 tablets (25-50 mg total) by mouth at bedtime as needed for sleep.    Dispense:  30 tablet    Refill:  [place], NP

## 2021-05-20 NOTE — Patient Instructions (Signed)
Please have labs drawn today ° °I will be moving to [facility] located at [address], [place], [place] [address] effective Jun 05, 2021. °If you would like to establish care with [facility] please call [phone]. °

## 2021-05-22 LAB — RHEUMATOID FACTOR: Rheumatoid fact SerPl-aCnc: <10 [IU]/mL

## 2021-05-22 LAB — ANA: Anti Nuclear Antibody (ANA): NEGATIVE

## 2021-05-22 LAB — VITAMIN B12: Vitamin B-12: 1163 pg/mL (ref 232–1245)

## 2021-05-22 LAB — LACTATE DEHYDROGENASE: LDH: 174 IU/L (ref 119–226)

## 2021-05-22 LAB — TSH: TSH: 1.38 u[IU]/mL (ref 0.450–4.500)

## 2021-05-22 LAB — C-REACTIVE PROTEIN: CRP: 7 mg/L (ref 0–10)

## 2021-05-22 LAB — SEDIMENTATION RATE: Sed Rate: 6 mm/h (ref 0–40)

## 2021-05-23 NOTE — Progress Notes (Signed)
The autoimmune panel and thyroid labs look great. No obvious cause of your fatigue.

## 2021-06-20 ENCOUNTER — Ambulatory Visit: Payer: Medicare HMO | Admitting: Nurse Practitioner

## 2021-06-24 ENCOUNTER — Encounter (HOSPITAL_COMMUNITY): Payer: Self-pay | Admitting: Hematology

## 2021-06-24 ENCOUNTER — Other Ambulatory Visit: Payer: Self-pay

## 2021-06-24 ENCOUNTER — Inpatient Hospital Stay (HOSPITAL_COMMUNITY): Payer: No Typology Code available for payment source

## 2021-06-24 ENCOUNTER — Inpatient Hospital Stay (HOSPITAL_COMMUNITY): Payer: No Typology Code available for payment source | Attending: Hematology | Admitting: Hematology

## 2021-06-24 DIAGNOSIS — R7989 Other specified abnormal findings of blood chemistry: Secondary | ICD-10-CM | POA: Diagnosis not present

## 2021-06-24 LAB — HEPATITIS B SURFACE ANTIGEN: Hepatitis B Surface Ag: NONREACTIVE

## 2021-06-24 LAB — HEPATITIS B SURFACE ANTIBODY,QUALITATIVE: Hep B S Ab: NONREACTIVE

## 2021-06-24 LAB — HEPATITIS B CORE ANTIBODY, TOTAL: Hep B Core Total Ab: NONREACTIVE

## 2021-06-24 LAB — HEPATITIS C ANTIBODY: HCV Ab: NONREACTIVE

## 2021-06-24 NOTE — Progress Notes (Signed)
[facility] [address] [place] [address]   CLINIC:  Medical Oncology/Hematology  Patient Care Team: Paseda, Dewaine Conger, FNP as PCP - General (Nurse Practitioner) Derek Jack, MD as Medical Oncologist (Hematology)  CHIEF COMPLAINTS/PURPOSE OF CONSULTATION:  Evaluation of elevated ferritin  HISTORY OF PRESENTING ILLNESS:  Stephanie Ramos 76 y.o. female is here because of evaluation of elevated ferritin, at the request of Dr. Pearline Cables.  Today she reports feeling good. She had been taking iron tablets for several years before they were discontinued 3 months ago. She had been taking a One-a-Day multivitamin with iron in it which she stopped 3 weeks ago. She denies hematochezia, hematuria, and black stools. She stopped menses at 76 years old. She denies history of liver issues and blood transfusions. She has a history of vertigo starting 2 years ago. She denies history of iron infusions. She denies joint panis and skin changes.   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 She denies smoking history. She denies family history hemochromatosis and cancer. She denies history alcohol consumption.  MEDICAL HISTORY:  Past Medical History:  Diagnosis Date   Hyperlipemia     SURGICAL HISTORY: Past Surgical History:  Procedure Laterality Date   BREAST SURGERY     CATARACT EXTRACTION W/PHACO Left 08/11/2019   Procedure: CATARACT EXTRACTION PHACO AND INTRAOCULAR LENS PLACEMENT (IOC) (CDE: 9.68);  Surgeon: Baruch Goldmann, MD;  Location: [facility];  Service: Ophthalmology;  Laterality: Left;   CATARACT EXTRACTION W/PHACO Right 09/29/2019   Procedure: CATARACT EXTRACTION PHACO AND INTRAOCULAR LENS PLACEMENT RIGHT EYE;  Surgeon: Baruch Goldmann, MD;  Location: [facility];  Service: Ophthalmology;  Laterality: Right;  CDE: 10.60   DILITATION & CURRETTAGE/HYSTROSCOPY WITH ESSURE      SOCIAL HISTORY: Social History   Socioeconomic History   Marital status: Married     Spouse name: Not on file   Number of children: 1   Years of education: Not on file   Highest education level: Not on file  Occupational History   Not on file  Tobacco Use   Smoking status: Never   Smokeless tobacco: Never  Substance and Sexual Activity   Alcohol use: Never   Drug use: Never   Sexual activity: Not on file  Other Topics Concern   Not on file  Social History Narrative   Lives with husband of 90 years   They have one child and one grandchild      She enjoys reading romance novels      Diet: all food groups   Caffeine: Limited   Water: 1/2 gallon or more a day      Wears seat belt   Does not use phone while driving   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    Social Determinants of Health   Financial Resource Strain: Low Risk    Difficulty of Paying Living Expenses: Not hard at all  Food Insecurity: No Food Insecurity   Worried About Charity fundraiser in the Last Year: Never true   Arboriculturist in the Last Year: Never true  Transportation Needs: No Transportation Needs   Lack of Transportation (Medical): No   Lack of Transportation (Non-Medical): No  Physical Activity: Sufficiently Active   Days of Exercise per Week: 6 days   Minutes of Exercise per Session: 30 min  Stress: No Stress Concern Present   Feeling of Stress : Not at  all  Social Connections: Moderately Integrated   Frequency of Communication with Friends and Family: More than three times a week   Frequency of Social Gatherings with Friends and Family: More than three times a week   Attends Religious Services: More than 4 times per year   Active Member of [employer] or Organizations: No   Attends Archivist Meetings: Never   Marital Status: Married  Human resources officer Violence: Not At Risk   Fear of Current or Ex-Partner: No   Emotionally Abused: No   Physically Abused: No   Sexually Abused: No    FAMILY HISTORY: History reviewed. No pertinent family  history.  ALLERGIES:  is allergic to sulfa antibiotics.  MEDICATIONS:  Current Outpatient Medications  Medication Sig Dispense Refill   Calcium Carb-Cholecalciferol 600-800 MG-UNIT TABS Take 1 tablet by mouth in the morning and at bedtime.     cyanocobalamin 1000 MCG tablet Take 1,000 mcg by mouth daily.     meclizine (ANTIVERT) 25 MG tablet Take 1 tablet (25 mg total) by mouth 3 (three) times daily as needed for dizziness. 30 tablet 2   omega-3 acid ethyl esters (LOVAZA) 1 g capsule Take by mouth 2 (two) times daily.     simvastatin (ZOCOR) 40 MG tablet Take 1 tablet by mouth once daily 90 tablet 0   traZODone (DESYREL) 50 MG tablet Take 0.5-1 tablets (25-50 mg total) by mouth at bedtime as needed for sleep. 30 tablet 3   Vitamin A 2400 MCG (8000 UT) TABS Take 8,000 Units by mouth daily.     vitamin C (ASCORBIC ACID) 500 MG tablet Take 500 mg by mouth every morning.     Vitamin E 400 units TABS Take 400 Units by mouth daily.     No current facility-administered medications for this visit.    REVIEW OF SYSTEMS:   Review of Systems  Constitutional:  Negative for appetite change and fatigue.  Gastrointestinal:  Negative for blood in stool.  Genitourinary:  Negative for hematuria.   Musculoskeletal:  Negative for arthralgias.  Psychiatric/Behavioral:  Positive for sleep disturbance.   All other systems reviewed and are negative.   PHYSICAL EXAMINATION: ECOG PERFORMANCE STATUS: 1 - Symptomatic but completely ambulatory  Vitals:   06/24/21 1138  BP: (!) 156/88  Pulse: (!) 111  Resp: 18  Temp: (!) 97.3 F (36.3 C)  SpO2: 100%   Filed Weights   06/24/21 1138  Weight: 133 lb 13.1 oz (60.7 kg)   Physical Exam Vitals reviewed.  Constitutional:      Appearance: Normal appearance.  Cardiovascular:     Rate and Rhythm: Normal rate and regular rhythm.     Pulses: Normal pulses.     Heart sounds: Normal heart sounds.  Pulmonary:     Effort: Pulmonary effort is normal.      Breath sounds: Normal breath sounds.  Abdominal:     Palpations: Abdomen is soft. There is no hepatomegaly, splenomegaly or mass.     Tenderness: There is no abdominal tenderness.  Lymphadenopathy:     Cervical: No cervical adenopathy.     Right cervical: No superficial cervical adenopathy.    Left cervical: No superficial cervical adenopathy.     Upper Body:     Right upper body: No supraclavicular, axillary or pectoral adenopathy.     Left upper body: No supraclavicular, axillary or pectoral adenopathy.     Lower Body: No right inguinal adenopathy. No left inguinal adenopathy.  Neurological:     General: No  focal deficit present.     Mental Status: She is alert and oriented to person, place, and time.  Psychiatric:        Mood and Affect: Mood normal.        Behavior: Behavior normal.     LABORATORY DATA:  I have reviewed the data as listed Recent Results (from the past 2160 hour(s))  Iron, TIBC and Ferritin Panel     Status: Abnormal   Collection Time: 05/16/21 10:19 AM  Result Value Ref Range   Total Iron Binding Capacity 301 250 - 450 ug/dL   UIBC 212 118 - 369 ug/dL   Iron 89 27 - 139 ug/dL   Iron Saturation 30 15 - 55 %   Ferritin 1,875 (H) 15 - 150 ng/mL  CBC with Differential/Platelet     Status: None   Collection Time: 05/16/21 10:19 AM  Result Value Ref Range   WBC 6.6 3.4 - 10.8 x10E3/uL   RBC 4.79 3.77 - 5.28 x10E6/uL   Hemoglobin 15.0 11.1 - 15.9 g/dL   Hematocrit 45.7 34.0 - 46.6 %   MCV 95 79 - 97 fL   MCH 31.3 26.6 - 33.0 pg   MCHC 32.8 31.5 - 35.7 g/dL   RDW 12.7 11.7 - 15.4 %   Platelets 414 150 - 450 x10E3/uL   Neutrophils 58 Not Estab. %   Lymphs 31 Not Estab. %   Monocytes 8 Not Estab. %   Eos 2 Not Estab. %   Basos 1 Not Estab. %   Neutrophils Absolute 3.9 1.4 - 7.0 x10E3/uL   Lymphocytes Absolute 2.0 0.7 - 3.1 x10E3/uL   Monocytes Absolute 0.5 0.1 - 0.9 x10E3/uL   EOS (ABSOLUTE) 0.1 0.0 - 0.4 x10E3/uL   Basophils Absolute 0.1 0.0 - 0.2  x10E3/uL   Immature Granulocytes 0 Not Estab. %   Immature Grans (Abs) 0.0 0.0 - 0.1 x10E3/uL  CMP14+EGFR     Status: Abnormal   Collection Time: 05/16/21 10:19 AM  Result Value Ref Range   Glucose 94 70 - 99 mg/dL   BUN 17 8 - 27 mg/dL   Creatinine, Ser 0.63 0.57 - 1.00 mg/dL   eGFR 93 >59 mL/min/1.73   BUN/Creatinine Ratio 27 12 - 28   Sodium 139 134 - 144 mmol/L   Potassium 4.7 3.5 - 5.2 mmol/L   Chloride 101 96 - 106 mmol/L   CO2 22 20 - 29 mmol/L   Calcium 9.8 8.7 - 10.3 mg/dL   Total Protein 7.1 6.0 - 8.5 g/dL   Albumin 5.0 (H) 3.7 - 4.7 g/dL   Globulin, Total 2.1 1.5 - 4.5 g/dL   Albumin/Globulin Ratio 2.4 (H) 1.2 - 2.2   Bilirubin Total 0.2 0.0 - 1.2 mg/dL   Alkaline Phosphatase 73 44 - 121 IU/L   AST 21 0 - 40 IU/L   ALT 21 0 - 32 IU/L  TSH     Status: None   Collection Time: 05/20/21 12:17 PM  Result Value Ref Range   TSH 1.380 0.450 - 4.500 uIU/mL  B12     Status: None   Collection Time: 05/20/21 12:17 PM  Result Value Ref Range   Vitamin B-12 1,163 232 - 1,245 pg/mL  Rheumatoid Factor     Status: None   Collection Time: 05/20/21 12:17 PM  Result Value Ref Range   Rhuematoid fact SerPl-aCnc <10.0 <14.0 IU/mL  Antinuclear Antib (ANA)     Status: None   Collection Time: 05/20/21 12:17 PM  Result  Value Ref Range   Anti Nuclear Antibody (ANA) Negative Negative  Sed Rate (ESR)     Status: None   Collection Time: 05/20/21 12:17 PM  Result Value Ref Range   Sed Rate 6 0 - 40 mm/hr  C-reactive protein     Status: None   Collection Time: 05/20/21 12:17 PM  Result Value Ref Range   CRP 7 0 - 10 mg/L  Lactate dehydrogenase     Status: None   Collection Time: 05/20/21 12:17 PM  Result Value Ref Range   LDH 174 119 - 226 IU/L    RADIOGRAPHIC STUDIES: I have personally reviewed the radiological images as listed and agreed with the findings in the report. No results found.  ASSESSMENT:  1.  Elevated ferritin levels: - Patient was on iron tablet daily for many  years, stopped in August 2022 when her ferritin was found to be elevated at 1609 with percent saturation 33. - She started taking woman's once a day vitamin which has iron in it. - Recent ferritin on 05/16/2021 was 1875.  He was told to stop taking woman's vitamin. - She denies any prior history of transfusion or parenteral iron therapy.  2.  Social/family history: -She lives at home with her husband.  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 - No family history of hemochromatosis or malignancies.   PLAN:  1.  Elevated ferritin levels: - We discussed causes of elevated ferritin levels including hemochromatosis, chronic inflammatory states and liver disease. - We will check hemochromatosis panel today.  I have also ordered hepatitis B and C serology. - We will also order right upper quadrant ultrasound to evaluate for underlying liver disease. - RTC 3 weeks for follow-up.    All questions were answered. The patient knows to call the clinic with any problems, questions or concerns.  Derek Jack, MD 06/24/21 12:05 PM  [facility] [phone]   I, Thana Ates, am acting as a scribe for Dr. Derek Jack.  I, Derek Jack MD, have reviewed the above documentation for accuracy and completeness, and I agree with the above.

## 2021-06-24 NOTE — Patient Instructions (Addendum)
[facility] at [facility] Discharge Instructions  You were seen and examined today by Dr. Ellin Saba. Dr. Ellin Saba is a hematologist, meaning that he specializes in blood disorders. Dr. Ellin Saba discussed your past medical history, family history of blood disorders/cancers, and the events that led to you being here today.  You were referred to Dr. Ellin Saba due to elevated ferritin (iron levels).  Dr. Ellin Saba has recommended additional lab work today in an attempt to identify the cause of your elevated ferritin. Dr. Ellin Saba has also recommended an ultrasound of your liver, as your liver can develop iron storage and keep too much iron.  Follow-up as scheduled.   Thank you for choosing [facility] at [facility] to provide your oncology and hematology care.  To afford each patient quality time with our provider, please arrive at least 15 minutes before your scheduled appointment time.   If you have a lab appointment with the Cancer Center please come in thru the Main Entrance and check in at the main information desk.  You need to re-schedule your appointment should you arrive 10 or more minutes late.  We strive to give you quality time with our providers, and arriving late affects you and other patients whose appointments are after yours.  Also, if you no show three or more times for appointments you may be dismissed from the clinic at the providers discretion.     Again, thank you for choosing [facility].  Our hope is that these requests will decrease the amount of time that you wait before being seen by our physicians.       _____________________________________________________________  Should you have questions after your visit to [facility], please contact our office at [phone] and follow the prompts.  Our office hours are 8:00 a.m. and 4:30 p.m. Monday - Friday.  Please note that voicemails left after  4:00 p.m. may not be returned until the following business day.  We are closed weekends and major holidays.  You do have access to a nurse 24-7, just call the main number to the clinic [phone] and do not press any options, hold on the line and a nurse will answer the phone.    For prescription refill requests, have your pharmacy contact our office and allow 72 hours.    Due to Covid, you will need to wear a mask upon entering the hospital. If you do not have a mask, a mask will be given to you at the Main Entrance upon arrival. For doctor visits, patients may have 1 support person age 32 or older with them. For treatment visits, patients can not have anyone with them due to social distancing guidelines and our immunocompromised population.

## 2021-06-27 ENCOUNTER — Other Ambulatory Visit: Payer: Self-pay | Admitting: Family Medicine

## 2021-06-27 DIAGNOSIS — E7841 Elevated Lipoprotein(a): Secondary | ICD-10-CM

## 2021-06-30 LAB — HEMOCHROMATOSIS DNA-PCR(C282Y,H63D)

## 2021-07-01 ENCOUNTER — Other Ambulatory Visit: Payer: Self-pay

## 2021-07-01 ENCOUNTER — Ambulatory Visit (HOSPITAL_COMMUNITY)
Admission: RE | Admit: 2021-07-01 | Discharge: 2021-07-01 | Disposition: A | Discharge status: Home / Self Care | Payer: No Typology Code available for payment source | Source: Ambulatory Visit | Attending: Hematology | Admitting: Hematology

## 2021-07-01 DIAGNOSIS — R7989 Other specified abnormal findings of blood chemistry: Secondary | ICD-10-CM | POA: Insufficient documentation

## 2021-07-01 DIAGNOSIS — K8020 Calculus of gallbladder without cholecystitis without obstruction: Secondary | ICD-10-CM | POA: Diagnosis not present

## 2021-07-13 NOTE — Progress Notes (Signed)
[place], [place] [address]   CLINIC:  Medical Oncology/Hematology  PCP:  Renee Rival, FNP [address] [address] [place] [address] [phone]   REASON FOR VISIT:  Follow-up for elevated ferritin  PRIOR THERAPY: None  CURRENT THERAPY: Under work-up  INTERVAL HISTORY:  [name] 76 y.o. female returns for routine follow-up of elevated ferritin.  She was seen for initial consultation by Dr. Delton Coombes on 06/24/2021.  At today's visit, she reports feeling fairly well apart from some anxiety regarding her test results.  She has not had any changes in her baseline health status since her last visit.  She had been taking iron tablets for several years before they were discontinued 3 months ago. She had been taking a One-a-Day multivitamin with iron in it which she stopped 3 weeks ago.   She denies hematochezia, hematuria, and black stools. She stopped menses at 76 years old.  She denies history of liver issues and blood transfusions.  She denies history of iron infusions.  She denies joint pains and skin changes.   She denies any abdominal pain.  She reports fatigue with energy about 75%, but states that this is related to increased stress from taking care of her husband, who had a heart attack several months ago.  She has 75% energy and 100% appetite. She endorses that she is maintaining a stable weight.   REVIEW OF SYSTEMS:  Review of Systems  Constitutional:  Positive for fatigue (energy 75%). Negative for appetite change, chills, diaphoresis, fever and unexpected weight change.  HENT:   Negative for lump/mass and nosebleeds.   Eyes:  Negative for eye problems.  Respiratory:  Negative for cough, hemoptysis and shortness of breath.   Cardiovascular:  Negative for chest pain, leg swelling and palpitations.  Gastrointestinal:  Negative for abdominal pain, blood in stool, constipation, diarrhea, nausea and vomiting.   Genitourinary:  Negative for hematuria.   Skin: Negative.   Neurological:  Negative for dizziness, headaches and light-headedness.  Hematological:  Does not bruise/bleed easily.     PAST MEDICAL/SURGICAL HISTORY:  Past Medical History:  Diagnosis Date   Hyperlipemia    Past Surgical History:  Procedure Laterality Date   BREAST SURGERY     CATARACT EXTRACTION W/PHACO Left 08/11/2019   Procedure: CATARACT EXTRACTION PHACO AND INTRAOCULAR LENS PLACEMENT (IOC) (CDE: 9.68);  Surgeon: Baruch Goldmann, MD;  Location: [facility];  Service: Ophthalmology;  Laterality: Left;   CATARACT EXTRACTION W/PHACO Right 09/29/2019   Procedure: CATARACT EXTRACTION PHACO AND INTRAOCULAR LENS PLACEMENT RIGHT EYE;  Surgeon: Baruch Goldmann, MD;  Location: [facility];  Service: Ophthalmology;  Laterality: Right;  CDE: 10.60   DILITATION & CURRETTAGE/HYSTROSCOPY WITH ESSURE       SOCIAL HISTORY:  Social History   Socioeconomic History   Marital status: Married    Spouse name: Not on file   Number of children: 1   Years of education: Not on file   Highest education level: Not on file  Occupational History   Not on file  Tobacco Use   Smoking status: Never   Smokeless tobacco: Never  Substance and Sexual Activity   Alcohol use: Never   Drug use: Never   Sexual activity: Not on file  Other Topics Concern   Not on file  Social History Narrative   Lives with husband of 45 years   They have one child and one grandchild      She enjoys reading romance  novels      Diet: all food groups   Caffeine: Limited   Water: 1/2 gallon or more a day      Wears seat belt   Does not use phone while driving   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    Social Determinants of Health   Financial Resource Strain: Low Risk    Difficulty of Paying Living Expenses: Not hard at all  Food Insecurity: No Food Insecurity   Worried About Charity fundraiser in the Last Year: Never true   Academic librarian in the Last Year: Never true  Transportation Needs: No Transportation Needs   Lack of Transportation (Medical): No   Lack of Transportation (Non-Medical): No  Physical Activity: Sufficiently Active   Days of Exercise per Week: 6 days   Minutes of Exercise per Session: 30 min  Stress: No Stress Concern Present   Feeling of Stress : Not at all  Social Connections: Moderately Integrated   Frequency of Communication with Friends and Family: More than three times a week   Frequency of Social Gatherings with Friends and Family: More than three times a week   Attends Religious Services: More than 4 times per year   Active Member of [employer] or Organizations: No   Attends Archivist Meetings: Never   Marital Status: Married  Human resources officer Violence: Not At Risk   Fear of Current or Ex-Partner: No   Emotionally Abused: No   Physically Abused: No   Sexually Abused: No    FAMILY HISTORY:  No family history on file.  CURRENT MEDICATIONS:  Outpatient Encounter Medications as of 07/14/2021  Medication Sig   Calcium Carb-Cholecalciferol 600-800 MG-UNIT TABS Take 1 tablet by mouth in the morning and at bedtime.   cyanocobalamin 1000 MCG tablet Take 1,000 mcg by mouth daily.   meclizine (ANTIVERT) 25 MG tablet Take 1 tablet (25 mg total) by mouth 3 (three) times daily as needed for dizziness.   omega-3 acid ethyl esters (LOVAZA) 1 g capsule Take by mouth 2 (two) times daily.   simvastatin (ZOCOR) 40 MG tablet Take 1 tablet by mouth once daily   traZODone (DESYREL) 50 MG tablet Take 0.5-1 tablets (25-50 mg total) by mouth at bedtime as needed for sleep.   Vitamin A 2400 MCG (8000 UT) TABS Take 8,000 Units by mouth daily.   vitamin C (ASCORBIC ACID) 500 MG tablet Take 500 mg by mouth every morning.   Vitamin E 400 units TABS Take 400 Units by mouth daily.   No facility-administered encounter medications on file as of 07/14/2021.    ALLERGIES:  Allergies  Allergen Reactions   Sulfa  Antibiotics Rash     PHYSICAL EXAM:  ECOG PERFORMANCE STATUS: 0 - Asymptomatic  There were no vitals filed for this visit. There were no vitals filed for this visit. Physical Exam Constitutional:      Appearance: Normal appearance.  HENT:     Head: Normocephalic and atraumatic.     Mouth/Throat:     Mouth: Mucous membranes are moist.  Eyes:     Extraocular Movements: Extraocular movements intact.     Pupils: Pupils are equal, round, and reactive to light.  Cardiovascular:     Rate and Rhythm: Normal rate and regular rhythm.     Pulses: Normal pulses.     Heart sounds: Normal heart sounds.  Pulmonary:     Effort: Pulmonary effort is normal.  Breath sounds: Normal breath sounds.  Abdominal:     General: Bowel sounds are normal.     Palpations: Abdomen is soft.     Tenderness: There is no abdominal tenderness.  Musculoskeletal:        General: No swelling.     Right lower leg: No edema.     Left lower leg: No edema.  Lymphadenopathy:     Cervical: No cervical adenopathy.  Skin:    General: Skin is warm and dry.  Neurological:     General: No focal deficit present.     Mental Status: She is alert and oriented to person, place, and time.  Psychiatric:        Mood and Affect: Mood normal.        Behavior: Behavior normal.     LABORATORY DATA:  I have reviewed the labs as listed.  CBC    Component Value Date/Time   WBC 6.6 05/16/2021 1019   WBC 6.8 12/29/2017 0602   RBC 4.79 05/16/2021 1019   RBC 4.31 12/29/2017 0602   HGB 15.0 05/16/2021 1019   HCT 45.7 05/16/2021 1019   PLT 414 05/16/2021 1019   MCV 95 05/16/2021 1019   MCH 31.3 05/16/2021 1019   MCH 32.7 12/29/2017 0602   MCHC 32.8 05/16/2021 1019   MCHC 32.0 12/29/2017 0602   RDW 12.7 05/16/2021 1019   LYMPHSABS 2.0 05/16/2021 1019   MONOABS 0.7 12/28/2017 0550   EOSABS 0.1 05/16/2021 1019   BASOSABS 0.1 05/16/2021 1019   CMP Latest Ref Rng & Units 05/16/2021 01/19/2021 07/19/2020  Glucose 70 - 99  mg/dL 94 92 90  BUN 8 - 27 mg/dL 17 14 16   Creatinine 0.57 - 1.00 mg/dL 0.63 0.74 0.73  Sodium 134 - 144 mmol/L 139 141 143  Potassium 3.5 - 5.2 mmol/L 4.7 4.7 4.6  Chloride 96 - 106 mmol/L 101 100 104  CO2 20 - 29 mmol/L 22 22 22   Calcium 8.7 - 10.3 mg/dL 9.8 9.7 9.6  Total Protein 6.0 - 8.5 g/dL 7.1 6.8 -  Total Bilirubin 0.0 - 1.2 mg/dL 0.2 0.2 -  Alkaline Phos 44 - 121 IU/L 73 81 -  AST 0 - 40 IU/L 21 24 -  ALT 0 - 32 IU/L 21 25 -    DIAGNOSTIC IMAGING:  I have independently reviewed the relevant imaging and discussed with the patient.  ASSESSMENT & PLAN: 1.  Elevated ferritin - Patient was on iron tablet daily for many years, stopped in August 2022 when her ferritin was found to be elevated at 1609 with percent saturation 33. - She started taking woman's once a day vitamin which has iron in it.  She was told to stop taking this in December 2022. - She denies any prior history of transfusion or parenteral iron therapy. - No family history of hemochromatosis. - Most recent iron panel (05/16/2021): Ferritin 1875, iron saturation 30% - Hematology work-up (06/24/2021): Hemochromatosis DNA analysis was negative for mutations.  Hepatitis B & C panel was negative. - Abdominal ultrasound (07/01/2021) did not show any signs of underlying liver disease that would explain elevated ferritin - She denies any chronic inflammatory state such as diabetes, autoimmune disease, or connective tissue disorder.   - Cause of elevated ferritin is unclear, but differential diagnosis does include secondary hemochromatosis in the setting of prolonged iron supplementation.  She may also have rare mutation for hemochromatosis that is not part of the standard panel. - PLAN: Check MRI liver to assess  for any iron deposition. - If any signs of liver iron overload, we will proceed with therapeutic phlebotomy. - Phone visit after MRI liver to discuss results and next steps.  2.  Social/family history: - She lives  at home with her husband.  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 - No family history of hemochromatosis or malignancies.   PLAN SUMMARY & DISPOSITION: MRI liver Same-day labs and office visit after MRI  All questions were answered. The patient knows to call the clinic with any problems, questions or concerns.  Medical decision making: Moderate  Time spent on visit: I spent 20 minutes counseling the patient face to face. The total time spent in the appointment was 30 minutes and more than 50% was on counseling.   Harriett Rush, PA-C  07/14/2021 9:36 AM

## 2021-07-14 ENCOUNTER — Inpatient Hospital Stay (HOSPITAL_COMMUNITY): Payer: No Typology Code available for payment source | Attending: Hematology | Admitting: Physician Assistant

## 2021-07-14 ENCOUNTER — Other Ambulatory Visit: Payer: Self-pay

## 2021-07-14 VITALS — BP 142/80 | HR 104 | Temp 98.2°F | Resp 18 | Ht 61.42 in | Wt 133.2 lb

## 2021-07-14 DIAGNOSIS — R7989 Other specified abnormal findings of blood chemistry: Secondary | ICD-10-CM | POA: Diagnosis not present

## 2021-07-14 MED ORDER — LORAZEPAM 1 MG PO TABS: 1.0000 mg | ORAL_TABLET | ORAL | 0 refills | Status: DC

## 2021-07-14 NOTE — Patient Instructions (Addendum)
[facility] at [facility] Discharge Instructions  You were seen today by Rojelio Brenner PA-C for your elevated ferritin (high iron levels).  Your test did not show any signs of underlying liver disease, hepatitis, or genetic abnormalities that would cause your body to have increased iron levels.  However, your iron level is still very high, and this places you at risk of liver damage in the future.  We would like to check an MRI of your liver to see if you have any increased iron in your liver tissue.  I have sent a prescription for Ativan x 1 tablet for you to take 30 minutes before your MRI for your anxiety.  FOLLOW-UP APPOINTMENT: Same-day labs and office visit in about 1 month (after MRI)   Thank you for choosing [facility] at [facility] to provide your oncology and hematology care.  To afford each patient quality time with our provider, please arrive at least 15 minutes before your scheduled appointment time.   If you have a lab appointment with the Cancer Center please come in thru the Main Entrance and check in at the main information desk.  You need to re-schedule your appointment should you arrive 10 or more minutes late.  We strive to give you quality time with our providers, and arriving late affects you and other patients whose appointments are after yours.  Also, if you no show three or more times for appointments you may be dismissed from the clinic at the providers discretion.     Again, thank you for choosing [facility].  Our hope is that these requests will decrease the amount of time that you wait before being seen by our physicians.       _____________________________________________________________  Should you have questions after your visit to [facility], please contact our office at [phone] and follow the prompts.  Our office hours are 8:00 a.m. and 4:30 p.m. Monday - Friday.  Please note  that voicemails left after 4:00 p.m. may not be returned until the following business day.  We are closed weekends and major holidays.  You do have access to a nurse 24-7, just call the main number to the clinic [phone] and do not press any options, hold on the line and a nurse will answer the phone.    For prescription refill requests, have your pharmacy contact our office and allow 72 hours.    Due to Covid, you will need to wear a mask upon entering the hospital. If you do not have a mask, a mask will be given to you at the Main Entrance upon arrival. For doctor visits, patients may have 1 support person age 62 or older with them. For treatment visits, patients can not have anyone with them due to social distancing guidelines and our immunocompromised population.

## 2021-07-19 DIAGNOSIS — R030 Elevated blood-pressure reading, without diagnosis of hypertension: Secondary | ICD-10-CM | POA: Diagnosis not present

## 2021-07-19 DIAGNOSIS — E7841 Elevated Lipoprotein(a): Secondary | ICD-10-CM | POA: Diagnosis not present

## 2021-07-20 LAB — CBC WITH DIFFERENTIAL/PLATELET
Basophils Absolute: 0.1 10*3/uL (ref 0.0–0.2)
Basos: 1 %
EOS (ABSOLUTE): 0.1 10*3/uL (ref 0.0–0.4)
Eos: 1 %
Hematocrit: 47.6 % — ABNORMAL HIGH (ref 34.0–46.6)
Hemoglobin: 15.6 g/dL (ref 11.1–15.9)
Immature Grans (Abs): 0 10*3/uL (ref 0.0–0.1)
Immature Granulocytes: 0 %
Lymphocytes Absolute: 1.8 10*3/uL (ref 0.7–3.1)
Lymphs: 22 %
MCH: 31.5 pg (ref 26.6–33.0)
MCHC: 32.8 g/dL (ref 31.5–35.7)
MCV: 96 fL (ref 79–97)
Monocytes Absolute: 0.7 10*3/uL (ref 0.1–0.9)
Monocytes: 8 %
Neutrophils Absolute: 5.6 10*3/uL (ref 1.4–7.0)
Neutrophils: 68 %
Platelets: 478 10*3/uL — ABNORMAL HIGH (ref 150–450)
RBC: 4.95 x10E6/uL (ref 3.77–5.28)
RDW: 13.3 % (ref 11.7–15.4)
WBC: 8.3 10*3/uL (ref 3.4–10.8)

## 2021-07-20 LAB — CMP14+EGFR
ALT: 18 IU/L (ref 0–32)
AST: 21 IU/L (ref 0–40)
Albumin/Globulin Ratio: 1.7 (ref 1.2–2.2)
Albumin: 4.6 g/dL (ref 3.7–4.7)
Alkaline Phosphatase: 77 IU/L (ref 44–121)
BUN/Creatinine Ratio: 22 (ref 12–28)
BUN: 17 mg/dL (ref 8–27)
Bilirubin Total: 0.2 mg/dL (ref 0.0–1.2)
CO2: 22 mmol/L (ref 20–29)
Calcium: 9.9 mg/dL (ref 8.7–10.3)
Chloride: 102 mmol/L (ref 96–106)
Creatinine, Ser: 0.76 mg/dL (ref 0.57–1.00)
Globulin, Total: 2.7 g/dL (ref 1.5–4.5)
Glucose: 97 mg/dL (ref 70–99)
Potassium: 5.5 mmol/L — ABNORMAL HIGH (ref 3.5–5.2)
Sodium: 139 mmol/L (ref 134–144)
Total Protein: 7.3 g/dL (ref 6.0–8.5)
eGFR: 82 mL/min/{1.73_m2} (ref >59)

## 2021-07-20 LAB — LIPID PANEL WITH LDL/HDL RATIO
Cholesterol, Total: 206 mg/dL — ABNORMAL HIGH (ref 100–199)
HDL: 62 mg/dL (ref >39)
LDL Chol Calc (NIH): 121 mg/dL — ABNORMAL HIGH (ref 0–99)
LDL/HDL Ratio: 2 ratio (ref 0.0–3.2)
Triglycerides: 129 mg/dL (ref 0–149)
VLDL Cholesterol Cal: 23 mg/dL (ref 5–40)

## 2021-07-21 ENCOUNTER — Ambulatory Visit (INDEPENDENT_AMBULATORY_CARE_PROVIDER_SITE_OTHER): Payer: No Typology Code available for payment source | Admitting: Nurse Practitioner

## 2021-07-21 ENCOUNTER — Encounter: Payer: Self-pay | Admitting: Nurse Practitioner

## 2021-07-21 ENCOUNTER — Ambulatory Visit: Payer: Medicare HMO | Admitting: Nurse Practitioner

## 2021-07-21 ENCOUNTER — Other Ambulatory Visit: Payer: Self-pay

## 2021-07-21 VITALS — BP 122/60 | HR 84 | Ht 61.0 in | Wt 134.0 lb

## 2021-07-21 DIAGNOSIS — E875 Hyperkalemia: Secondary | ICD-10-CM | POA: Diagnosis not present

## 2021-07-21 DIAGNOSIS — E7841 Elevated Lipoprotein(a): Secondary | ICD-10-CM | POA: Diagnosis not present

## 2021-07-21 MED ORDER — EZETIMIBE 10 MG PO TABS: 10.0000 mg | ORAL_TABLET | Freq: Every day | ORAL | 3 refills | Status: DC

## 2021-07-21 MED ORDER — LOKELMA 5 G PO PACK: 5.0000 g | PACK | Freq: Once | ORAL | 0 refills | Status: AC

## 2021-07-21 NOTE — Patient Instructions (Addendum)

## 2021-07-21 NOTE — Progress Notes (Signed)
° °  Stephanie Ramos     MRN: [id number]      DOB: [birth year]   HPI Stephanie Ramos is here for follow up and re-evaluation of chronic medical conditions, medication management and review of any available recent lab and radiology data.  Preventive health is updated, specifically  Cancer screening and Immunization.   Questions or concerns regarding consultations or procedures which the PT has had in the interim are  addressed. The PT denies any adverse reactions to current medications since the last visit.  There are no new concerns.  There are no specific complaints    Pt stated that she has not haD dizziness in over a year, she has been sleeping well without taking medications.   Pt refused  all vaccines due , need for the vaccines discussed with the pt she verbalized understanding but stated that she will not get them at this this time. '' I feel fie, I stay away from people  The need for colonoscopy and mammogram discussed with pt, she verbalized understanding, she stated that she want her iron levels normalized before and the she will think about the screening test.    ROS Denies recent fever or chills. Denies sinus pressure, nasal congestion, ear pain or sore throat. Denies chest congestion, productive cough or wheezing. Denies chest pains, palpitations and leg swelling Denies abdominal pain, nausea, vomiting,diarrhea or constipation.   Denies dysuria, frequency, hesitancy or incontinence. Denies joint pain, swelling and limitation in mobility. Denies headaches, seizures, numbness, or tingling. Denies depression, anxiety or insomnia.     PE  BP 122/60    Pulse 84    Ht 5\' 1"  (1.549 m)    Wt 134 lb (60.8 kg)    SpO2 97%    BMI 25.32 kg/m   Patient alert and oriented and in no cardiopulmonary distress.    Chest: Clear to auscultation bilaterally.  CVS: S1, S2 no murmurs, no S3.Regular rate.  ABD: Soft non tender.   Ext: No edema  MS: Adequate ROM spine, shoulders,  hips and knees.  Skin: Intact, no ulcerations or rash noted.  Psych: Good eye contact, normal affect. Memory intact not anxious or depressed appearing.  CNS: CN 2-12 intact, power,  normal throughout.no focal deficits noted.   Assessment & Plan

## 2021-07-21 NOTE — Assessment & Plan Note (Signed)
Lab Results  Component Value Date   CHOL 206 (H) 07/19/2021   HDL 62 07/19/2021   LDLCALC 121 (H) 07/19/2021   TRIG 129 07/19/2021   CHOLHDL 2.9 07/19/2020   TAKES CRESTOR 40MG  daily Start ezetimbe 10mg  daily The 10-year ASCVD risk score (Arnett DK, et al., 2019) is: 13.3%   Values used to calculate the score:     Age: 76 years     Sex: Female     Is Non-Hispanic African American: No     Diabetic: No     Tobacco smoker: No     Systolic Blood Pressure: 122 mmHg     Is BP treated: No     HDL Cholesterol: 62 mg/dL     Total Cholesterol: 206 mg/dL

## 2021-07-21 NOTE — Assessment & Plan Note (Signed)
Take lokelmia 63m one time, BMP in one week  Lab Results  Component Value Date   NA 139 07/19/2021   K 5.5 (H) 07/19/2021   CO2 22 07/19/2021   GLUCOSE 97 07/19/2021   BUN 17 07/19/2021   CREATININE 0.76 07/19/2021   CALCIUM 9.9 07/19/2021   EGFR 82 07/19/2021   GFRNONAA 82 07/19/2020

## 2021-07-25 DIAGNOSIS — E875 Hyperkalemia: Secondary | ICD-10-CM | POA: Diagnosis not present

## 2021-07-26 LAB — BASIC METABOLIC PANEL
BUN/Creatinine Ratio: 16 (ref 12–28)
BUN: 12 mg/dL (ref 8–27)
CO2: 23 mmol/L (ref 20–29)
Calcium: 10.4 mg/dL — ABNORMAL HIGH (ref 8.7–10.3)
Chloride: 103 mmol/L (ref 96–106)
Creatinine, Ser: 0.76 mg/dL (ref 0.57–1.00)
Glucose: 96 mg/dL (ref 70–99)
Potassium: 5.1 mmol/L (ref 3.5–5.2)
Sodium: 141 mmol/L (ref 134–144)
eGFR: 82 mL/min/{1.73_m2} (ref >59)

## 2021-07-26 NOTE — Progress Notes (Signed)
Please review labs with patient Her potassium level is back to normal, calcium is slightly elevated We will recheck labs at her next appointment.  Thank you

## 2021-07-27 ENCOUNTER — Ambulatory Visit (HOSPITAL_COMMUNITY)
Admission: RE | Admit: 2021-07-27 | Discharge: 2021-07-27 | Disposition: A | Discharge status: Home / Self Care | Payer: No Typology Code available for payment source | Source: Ambulatory Visit | Attending: Physician Assistant | Admitting: Physician Assistant

## 2021-07-27 ENCOUNTER — Other Ambulatory Visit: Payer: Self-pay

## 2021-07-27 DIAGNOSIS — K8020 Calculus of gallbladder without cholecystitis without obstruction: Secondary | ICD-10-CM | POA: Diagnosis not present

## 2021-07-27 DIAGNOSIS — R7989 Other specified abnormal findings of blood chemistry: Secondary | ICD-10-CM | POA: Diagnosis not present

## 2021-07-27 MED ORDER — GADOBUTROL 1 MMOL/ML IV SOLN
6.0000 mL | Freq: Once | INTRAVENOUS | Status: AC | PRN
  Administered 2021-07-27: 6 mL via INTRAVENOUS

## 2021-07-28 NOTE — Progress Notes (Unsigned)
RESCHEDULE 

## 2021-07-29 ENCOUNTER — Inpatient Hospital Stay (HOSPITAL_COMMUNITY): Payer: No Typology Code available for payment source

## 2021-07-29 ENCOUNTER — Inpatient Hospital Stay (HOSPITAL_COMMUNITY): Payer: No Typology Code available for payment source | Admitting: Physician Assistant

## 2021-08-01 NOTE — Progress Notes (Signed)
[place], [place] [address]   CLINIC:  Medical Oncology/Hematology  PCP:  Renee Rival, FNP [address] [place] [address] [place] [address] [phone]   REASON FOR VISIT:  Follow-up for elevated ferritin   PRIOR THERAPY: None   CURRENT THERAPY: Under work-up   INTERVAL HISTORY:  Ms. Goldinger 76 y.o. female returns for routine follow-up of elevated ferritin.  She was last seen by Tarri Abernethy PA-C on 07/14/2021.  At today's visit, she reports feeling fairly well.  She has not had any changes in her baseline health status since her last visit. She denies hematochezia, hematuria, and black stools. She denies joint pains and skin changes.    She denies any abdominal pain. She reports mild fatigue with energy about 80%, but states that this is related to increased stress from taking care of her husband, who had a heart attack several months ago.  She has 80% energy and 100% appetite. She endorses that she is maintaining a stable weight.   REVIEW OF SYSTEMS:  Review of Systems  Constitutional:  Positive for fatigue (energy 80%). Negative for appetite change, chills, diaphoresis, fever and unexpected weight change.  HENT:   Negative for lump/mass and nosebleeds.   Eyes:  Negative for eye problems.  Respiratory:  Negative for cough, hemoptysis and shortness of breath.   Cardiovascular:  Negative for chest pain, leg swelling and palpitations.  Gastrointestinal:  Negative for abdominal pain, blood in stool, constipation, diarrhea, nausea and vomiting.  Genitourinary:  Negative for hematuria.   Skin: Negative.   Neurological:  Negative for dizziness, headaches and light-headedness.  Hematological:  Does not bruise/bleed easily.     PAST MEDICAL/SURGICAL HISTORY:  Past Medical History:  Diagnosis Date   Hyperlipemia    Past Surgical History:  Procedure Laterality Date   BREAST SURGERY     CATARACT EXTRACTION  W/PHACO Left 08/11/2019   Procedure: CATARACT EXTRACTION PHACO AND INTRAOCULAR LENS PLACEMENT (IOC) (CDE: 9.68);  Surgeon: Baruch Goldmann, MD;  Location: [facility];  Service: Ophthalmology;  Laterality: Left;   CATARACT EXTRACTION W/PHACO Right 09/29/2019   Procedure: CATARACT EXTRACTION PHACO AND INTRAOCULAR LENS PLACEMENT RIGHT EYE;  Surgeon: Baruch Goldmann, MD;  Location: [facility];  Service: Ophthalmology;  Laterality: Right;  CDE: 10.60   DILITATION & CURRETTAGE/HYSTROSCOPY WITH ESSURE       SOCIAL HISTORY:  Social History   Socioeconomic History   Marital status: Married    Spouse name: Not on file   Number of children: 1   Years of education: Not on file   Highest education level: Not on file  Occupational History   Not on file  Tobacco Use   Smoking status: Never   Smokeless tobacco: Never  Substance and Sexual Activity   Alcohol use: Never   Drug use: Never   Sexual activity: Not on file  Other Topics Concern   Not on file  Social History Narrative   Lives with husband of 60 years   They have one child and one grandchild      She enjoys reading romance novels      Diet: all food groups   Caffeine: Limited   Water: 1/2 gallon or more a day      Wears seat belt   Does not use phone while driving   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    Social Determinants of Radio broadcast assistant  Strain: Low Risk    Difficulty of Paying Living Expenses: Not hard at all  Food Insecurity: No Food Insecurity   Worried About Charity fundraiser in the Last Year: Never true   Ran Out of Food in the Last Year: Never true  Transportation Needs: No Transportation Needs   Lack of Transportation (Medical): No   Lack of Transportation (Non-Medical): No  Physical Activity: Sufficiently Active   Days of Exercise per Week: 6 days   Minutes of Exercise per Session: 30 min  Stress: No Stress Concern Present   Feeling of Stress : Not at all  Social  Connections: Moderately Integrated   Frequency of Communication with Friends and Family: More than three times a week   Frequency of Social Gatherings with Friends and Family: More than three times a week   Attends Religious Services: More than 4 times per year   Active Member of [employer] or Organizations: No   Attends Archivist Meetings: Never   Marital Status: Married  Human resources officer Violence: Not At Risk   Fear of Current or Ex-Partner: No   Emotionally Abused: No   Physically Abused: No   Sexually Abused: No    FAMILY HISTORY:  No family history on file.  CURRENT MEDICATIONS:  Outpatient Encounter Medications as of 08/02/2021  Medication Sig   Calcium Carb-Cholecalciferol 600-800 MG-UNIT TABS Take 1 tablet by mouth in the morning and at bedtime.   cyanocobalamin 1000 MCG tablet Take 1,000 mcg by mouth daily.   ezetimibe (ZETIA) 10 MG tablet Take 1 tablet (10 mg total) by mouth daily.   LORazepam (ATIVAN) 1 MG tablet Take 1 tablet (1 mg total) by mouth as directed. Take 30 minutes before your MRI (Patient not taking: Reported on 07/21/2021)   meclizine (ANTIVERT) 25 MG tablet Take 1 tablet (25 mg total) by mouth 3 (three) times daily as needed for dizziness. (Patient not taking: Reported on 07/21/2021)   omega-3 acid ethyl esters (LOVAZA) 1 g capsule Take by mouth 2 (two) times daily.   simvastatin (ZOCOR) 40 MG tablet Take 1 tablet by mouth once daily   traZODone (DESYREL) 50 MG tablet Take 0.5-1 tablets (25-50 mg total) by mouth at bedtime as needed for sleep. (Patient not taking: Reported on 07/21/2021)   Vitamin A 2400 MCG (8000 UT) TABS Take 8,000 Units by mouth daily.   vitamin C (ASCORBIC ACID) 500 MG tablet Take 500 mg by mouth every morning.   Vitamin E 400 units TABS Take 400 Units by mouth daily.   No facility-administered encounter medications on file as of 08/02/2021.    ALLERGIES:  Allergies  Allergen Reactions   Sulfa Antibiotics Rash     PHYSICAL  EXAM:  ECOG PERFORMANCE STATUS: 0 - Asymptomatic  There were no vitals filed for this visit. There were no vitals filed for this visit. Physical Exam Constitutional:      Appearance: Normal appearance.  HENT:     Head: Normocephalic and atraumatic.     Mouth/Throat:     Mouth: Mucous membranes are moist.  Eyes:     Extraocular Movements: Extraocular movements intact.     Pupils: Pupils are equal, round, and reactive to light.  Cardiovascular:     Rate and Rhythm: Normal rate and regular rhythm.     Pulses: Normal pulses.     Heart sounds: Normal heart sounds.  Pulmonary:     Effort: Pulmonary effort is normal.     Breath sounds: Normal breath sounds.  Abdominal:     General: Bowel sounds are normal.     Palpations: Abdomen is soft.     Tenderness: There is no abdominal tenderness.  Musculoskeletal:        General: No swelling.     Right lower leg: No edema.     Left lower leg: No edema.  Lymphadenopathy:     Cervical: No cervical adenopathy.  Skin:    General: Skin is warm and dry.  Neurological:     General: No focal deficit present.     Mental Status: She is alert and oriented to person, place, and time.  Psychiatric:        Mood and Affect: Mood normal.        Behavior: Behavior normal.     LABORATORY DATA:  I have reviewed the labs as listed.  CBC    Component Value Date/Time   WBC 8.3 07/19/2021 1016   WBC 6.8 12/29/2017 0602   RBC 4.95 07/19/2021 1016   RBC 4.31 12/29/2017 0602   HGB 15.6 07/19/2021 1016   HCT 47.6 (H) 07/19/2021 1016   PLT 478 (H) 07/19/2021 1016   MCV 96 07/19/2021 1016   MCH 31.5 07/19/2021 1016   MCH 32.7 12/29/2017 0602   MCHC 32.8 07/19/2021 1016   MCHC 32.0 12/29/2017 0602   RDW 13.3 07/19/2021 1016   LYMPHSABS 1.8 07/19/2021 1016   MONOABS 0.7 12/28/2017 0550   EOSABS 0.1 07/19/2021 1016   BASOSABS 0.1 07/19/2021 1016   CMP Latest Ref Rng & Units 07/25/2021 07/19/2021 05/16/2021  Glucose 70 - 99 mg/dL 96 97 94  BUN 8 -  27 mg/dL 12 17 17   Creatinine 0.57 - 1.00 mg/dL 0.76 0.76 0.63  Sodium 134 - 144 mmol/L 141 139 139  Potassium 3.5 - 5.2 mmol/L 5.1 5.5(H) 4.7  Chloride 96 - 106 mmol/L 103 102 101  CO2 20 - 29 mmol/L 23 22 22   Calcium 8.7 - 10.3 mg/dL 10.4(H) 9.9 9.8  Total Protein 6.0 - 8.5 g/dL - 7.3 7.1  Total Bilirubin 0.0 - 1.2 mg/dL - 0.2 0.2  Alkaline Phos 44 - 121 IU/L - 77 73  AST 0 - 40 IU/L - 21 21  ALT 0 - 32 IU/L - 18 21    DIAGNOSTIC IMAGING:  I have independently reviewed the relevant imaging and discussed with the patient.  ASSESSMENT & PLAN: 1.  Elevated ferritin with hepatic iron deposition - Patient was on iron tablet daily for many years, stopped in August 2022 when her ferritin was found to be elevated at 1609 with percent saturation 33. - She started taking woman's once a day vitamin which has iron in it.  She was told to stop taking this in December 2022. - She denies any prior history of transfusion or parenteral iron therapy. - No family history of hemochromatosis. - Most recent iron panel (05/16/2021): Ferritin 1875, iron saturation 30% - Hematology work-up (06/24/2021): Hemochromatosis DNA analysis was negative for mutations.  Hepatitis B & C panel was negative. - Abdominal ultrasound (07/01/2021) did not show any signs of underlying liver disease that would explain elevated ferritin; normal parenchymal echogenicity - MRI liver (07/27/2021): Hepatic parenchymal signal inversion on in-and-out phase sequences is consistent with hepatic iron deposition; additionally, splenic and bone marrow signal loss suggest reticuloendothelial iron deposition; quantitative assessment of liver iron concentration not performed - Labs today (08/02/2021): Hgb 14.5/MCV 98.5, ferritin 900, iron saturation 17% - She denies any chronic inflammatory state such as diabetes, autoimmune  disease, or connective tissue disorder.   - Cause of elevated ferritin is unclear, but differential diagnosis does include  secondary hemochromatosis in the setting of prolonged iron supplementation.  She may also have rare mutation for hemochromatosis that is not part of the standard panel (non-HFE mutation). - PLAN: Due to signs of hepatic and reticuloendothelial iron deposition, we will proceed with phlebotomy with goal ferritin  < 100 - Phlebotomy every other week as tolerated with repeat labs and RTC in 2 months.    2.  Social/family history: - She lives at home with her husband.  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 - No family history of hemochromatosis or malignancies.   PLAN SUMMARY & DISPOSITION: Therapeutic phlebotomy every 2 weeks Monthly CBC Same-day labs and office visit in 2 months  All questions were answered. The patient knows to call the clinic with any problems, questions or concerns.  Medical decision making: Moderate  Time spent on visit: I spent 20 minutes counseling the patient face to face. The total time spent in the appointment was 30 minutes and more than 50% was on counseling.   Harriett Rush, PA-C  08/02/2021 5:30 PM

## 2021-08-02 ENCOUNTER — Other Ambulatory Visit: Payer: Self-pay

## 2021-08-02 ENCOUNTER — Encounter (HOSPITAL_COMMUNITY): Payer: Self-pay | Admitting: Physician Assistant

## 2021-08-02 ENCOUNTER — Inpatient Hospital Stay (HOSPITAL_COMMUNITY): Payer: No Typology Code available for payment source

## 2021-08-02 ENCOUNTER — Inpatient Hospital Stay (HOSPITAL_BASED_OUTPATIENT_CLINIC_OR_DEPARTMENT_OTHER): Payer: No Typology Code available for payment source | Admitting: Physician Assistant

## 2021-08-02 DIAGNOSIS — R7989 Other specified abnormal findings of blood chemistry: Secondary | ICD-10-CM

## 2021-08-02 HISTORY — DX: Other disorders of iron metabolism: E83.19

## 2021-08-02 LAB — IRON AND TIBC
Iron: 62 ug/dL (ref 28–170)
Saturation Ratios: 17 % (ref 10.4–31.8)
TIBC: 358 ug/dL (ref 250–450)
UIBC: 296 ug/dL

## 2021-08-02 LAB — CBC WITH DIFFERENTIAL/PLATELET
Abs Immature Granulocytes: 0.02 10*3/uL (ref 0.00–0.07)
Basophils Absolute: 0 10*3/uL (ref 0.0–0.1)
Basophils Relative: 1 %
Eosinophils Absolute: 0.1 10*3/uL (ref 0.0–0.5)
Eosinophils Relative: 1 %
HCT: 45.3 % (ref 36.0–46.0)
Hemoglobin: 14.5 g/dL (ref 12.0–15.0)
Immature Granulocytes: 0 %
Lymphocytes Relative: 25 %
Lymphs Abs: 2 10*3/uL (ref 0.7–4.0)
MCH: 31.5 pg (ref 26.0–34.0)
MCHC: 32 g/dL (ref 30.0–36.0)
MCV: 98.5 fL (ref 80.0–100.0)
Monocytes Absolute: 0.7 10*3/uL (ref 0.1–1.0)
Monocytes Relative: 9 %
Neutro Abs: 5.3 10*3/uL (ref 1.7–7.7)
Neutrophils Relative %: 64 %
Platelets: 380 10*3/uL (ref 150–400)
RBC: 4.6 MIL/uL (ref 3.87–5.11)
RDW: 12.6 % (ref 11.5–15.5)
WBC: 8.2 10*3/uL (ref 4.0–10.5)
nRBC: 0 % (ref 0.0–0.2)

## 2021-08-02 LAB — FERRITIN: Ferritin: 900 ng/mL — ABNORMAL HIGH (ref 11–307)

## 2021-08-02 NOTE — Patient Instructions (Signed)
[facility] at [facility] Discharge Instructions  You were seen today by Rojelio Brenner PA-C for your iron overload (hemochromatosis).  Your ferritin level remains elevated, and the MRI of your liver showed signs of liver iron overload.  In order to decrease the amount of iron in your liver (and help prevent liver damage and cirrhosis), we will be scheduling you for phlebotomy (taking blood) once every 2 weeks.  This will help to decrease the amount of iron in your body over time.  LABS: Labs once per month  FOLLOW-UP APPOINTMENT: Office visit in about 2 months   Thank you for choosing [facility] at [facility] to provide your oncology and hematology care.  To afford each patient quality time with our provider, please arrive at least 15 minutes before your scheduled appointment time.   If you have a lab appointment with the Cancer Center please come in thru the Main Entrance and check in at the main information desk.  You need to re-schedule your appointment should you arrive 10 or more minutes late.  We strive to give you quality time with our providers, and arriving late affects you and other patients whose appointments are after yours.  Also, if you no show three or more times for appointments you may be dismissed from the clinic at the providers discretion.     Again, thank you for choosing [facility].  Our hope is that these requests will decrease the amount of time that you wait before being seen by our physicians.       _____________________________________________________________  Should you have questions after your visit to [facility], please contact our office at [phone] and follow the prompts.  Our office hours are 8:00 a.m. and 4:30 p.m. Monday - Friday.  Please note that voicemails left after 4:00 p.m. may not be returned until the following business day.  We are closed weekends and major  holidays.  You do have access to a nurse 24-7, just call the main number to the clinic [phone] and do not press any options, hold on the line and a nurse will answer the phone.    For prescription refill requests, have your pharmacy contact our office and allow 72 hours.    Due to Covid, you will need to wear a mask upon entering the hospital. If you do not have a mask, a mask will be given to you at the Main Entrance upon arrival. For doctor visits, patients may have 1 support person age 9 or older with them. For treatment visits, patients can not have anyone with them due to social distancing guidelines and our immunocompromised population.

## 2021-08-04 ENCOUNTER — Other Ambulatory Visit: Payer: Self-pay

## 2021-08-04 ENCOUNTER — Inpatient Hospital Stay (HOSPITAL_COMMUNITY): Payer: No Typology Code available for payment source | Attending: Hematology

## 2021-08-04 DIAGNOSIS — R7989 Other specified abnormal findings of blood chemistry: Secondary | ICD-10-CM | POA: Insufficient documentation

## 2021-08-04 NOTE — Progress Notes (Signed)
?  Stephanie Ramos presents today for phlebotomy per MD orders. ?Phlebotomy procedure started at 1438 and ended at 1456. ?500 cc removed. ?Patient tolerated procedure well. ?IV needle removed intact. ? ? ?

## 2021-08-04 NOTE — Patient Instructions (Signed)
[facility]  Discharge Instructions: ?Thank you for choosing [facility] to provide your oncology and hematology care.  ?If you have a lab appointment with the Cancer Center, please come in thru the Main Entrance and check in at the main information desk. ? ?Wear comfortable clothing and clothing appropriate for easy access to any Portacath or PICC line.  ? ?We strive to give you quality time with your provider. You may need to reschedule your appointment if you arrive late (15 or more minutes).  Arriving late affects you and other patients whose appointments are after yours.  Also, if you miss three or more appointments without notifying the office, you may be dismissed from the clinic at the provider?s discretion.    ?  ?For prescription refill requests, have your pharmacy contact our office and allow 72 hours for refills to be completed.   ? ?Today you received the following Phlebotomy, return as scheduled. ?  ?To help prevent nausea and vomiting after your treatment, we encourage you to take your nausea medication as directed. ? ?BELOW ARE SYMPTOMS THAT SHOULD BE REPORTED IMMEDIATELY: ?*FEVER GREATER THAN 100.4 F (38 ?C) OR HIGHER ?*CHILLS OR SWEATING ?*NAUSEA AND VOMITING THAT IS NOT CONTROLLED WITH YOUR NAUSEA MEDICATION ?*UNUSUAL SHORTNESS OF BREATH ?*UNUSUAL BRUISING OR BLEEDING ?*URINARY PROBLEMS (pain or burning when urinating, or frequent urination) ?*BOWEL PROBLEMS (unusual diarrhea, constipation, pain near the anus) ?TENDERNESS IN MOUTH AND THROAT WITH OR WITHOUT PRESENCE OF ULCERS (sore throat, sores in mouth, or a toothache) ?UNUSUAL RASH, SWELLING OR PAIN  ?UNUSUAL VAGINAL DISCHARGE OR ITCHING  ? ?Items with * indicate a potential emergency and should be followed up as soon as possible or go to the Emergency Department if any problems should occur. ? ?Please show the CHEMOTHERAPY ALERT CARD or IMMUNOTHERAPY ALERT CARD at check-in to the Emergency Department and triage  nurse. ? ?Should you have questions after your visit or need to cancel or reschedule your appointment, please contact [facility] [phone]  and follow the prompts.  Office hours are 8:00 a.m. to 4:30 p.m. Monday - Friday. Please note that voicemails left after 4:00 p.m. may not be returned until the following business day.  We are closed weekends and major holidays. You have access to a nurse at all times for urgent questions. Please call the main number to the clinic [phone] and follow the prompts. ? ?For any non-urgent questions, you may also contact your provider using MyChart. We now offer e-Visits for anyone 18 and older to request care online for non-urgent symptoms. For details visit [email or web address]. ?  ?Also download the MyChart app! Go to the app store, search "MyChart", open the app, select [facility], and log in with your MyChart username and password. ? ?Due to Covid, a mask is required upon entering the hospital/clinic. If you do not have a mask, one will be given to you upon arrival. For doctor visits, patients may have 1 support person aged 18 or older with them. For treatment visits, patients cannot have anyone with them due to current Covid guidelines and our immunocompromised population.  ?

## 2021-08-18 ENCOUNTER — Inpatient Hospital Stay (HOSPITAL_COMMUNITY): Payer: No Typology Code available for payment source

## 2021-08-18 ENCOUNTER — Other Ambulatory Visit: Payer: Self-pay

## 2021-08-18 ENCOUNTER — Encounter (HOSPITAL_COMMUNITY): Payer: Self-pay

## 2021-08-18 DIAGNOSIS — R7989 Other specified abnormal findings of blood chemistry: Secondary | ICD-10-CM | POA: Diagnosis not present

## 2021-08-18 NOTE — Patient Instructions (Signed)
[facility]  Discharge Instructions: ?Thank you for choosing [facility] to provide your oncology and hematology care.  ?If you have a lab appointment with the Cancer Center, please come in thru the Main Entrance and check in at the main information desk. ? ?Wear comfortable clothing and clothing appropriate for easy access to any Portacath or PICC line.  ? ?We strive to give you quality time with your provider. You may need to reschedule your appointment if you arrive late (15 or more minutes).  Arriving late affects you and other patients whose appointments are after yours.  Also, if you miss three or more appointments without notifying the office, you may be dismissed from the clinic at the provider?s discretion.    ?  ?For prescription refill requests, have your pharmacy contact our office and allow 72 hours for refills to be completed.   ? ?Today you received the following phlebotomy procedure, return as scheduled. ?  ?To help prevent nausea and vomiting after your treatment, we encourage you to take your nausea medication as directed. ? ?BELOW ARE SYMPTOMS THAT SHOULD BE REPORTED IMMEDIATELY: ?*FEVER GREATER THAN 100.4 F (38 ?C) OR HIGHER ?*CHILLS OR SWEATING ?*NAUSEA AND VOMITING THAT IS NOT CONTROLLED WITH YOUR NAUSEA MEDICATION ?*UNUSUAL SHORTNESS OF BREATH ?*UNUSUAL BRUISING OR BLEEDING ?*URINARY PROBLEMS (pain or burning when urinating, or frequent urination) ?*BOWEL PROBLEMS (unusual diarrhea, constipation, pain near the anus) ?TENDERNESS IN MOUTH AND THROAT WITH OR WITHOUT PRESENCE OF ULCERS (sore throat, sores in mouth, or a toothache) ?UNUSUAL RASH, SWELLING OR PAIN  ?UNUSUAL VAGINAL DISCHARGE OR ITCHING  ? ?Items with * indicate a potential emergency and should be followed up as soon as possible or go to the Emergency Department if any problems should occur. ? ?Please show the CHEMOTHERAPY ALERT CARD or IMMUNOTHERAPY ALERT CARD at check-in to the Emergency Department and  triage nurse. ? ?Should you have questions after your visit or need to cancel or reschedule your appointment, please contact [facility] [phone]  and follow the prompts.  Office hours are 8:00 a.m. to 4:30 p.m. Monday - Friday. Please note that voicemails left after 4:00 p.m. may not be returned until the following business day.  We are closed weekends and major holidays. You have access to a nurse at all times for urgent questions. Please call the main number to the clinic [phone] and follow the prompts. ? ?For any non-urgent questions, you may also contact your provider using MyChart. We now offer e-Visits for anyone 45 and older to request care online for non-urgent symptoms. For details visit [email or web address]. ?  ?Also download the MyChart app! Go to the app store, search "MyChart", open the app, select [facility], and log in with your MyChart username and password. ? ?Due to Covid, a mask is required upon entering the hospital/clinic. If you do not have a mask, one will be given to you upon arrival. For doctor visits, patients may have 1 support person aged 67 or older with them. For treatment visits, patients cannot have anyone with them due to current Covid guidelines and our immunocompromised population.  ?

## 2021-08-18 NOTE — Progress Notes (Signed)
Patient presents today for phlebotomy. Procedure started at 1425, 230cc removed, patient started to feel hot and sweaty. At 1437 Patient laid back in chair and offered a soda to drink. Phlebotomy stopped. Patient's BP 105/68. Stephanie Brenner PA made aware of patient's symptoms, with orders received to continue with Phlebotomy in two weeks, hold Phlebotomy if Hgb <12, half phlebotomy's would be appropriate for patient if tolerated. Site clean and dry with no bruising or swelling noted at site. See MAR for details. Bandage applied. VSS with discharge and left in satisfactory condition with no s/s of distress noted. better.  ?

## 2021-09-01 ENCOUNTER — Inpatient Hospital Stay (HOSPITAL_COMMUNITY): Payer: No Typology Code available for payment source

## 2021-09-01 DIAGNOSIS — R7989 Other specified abnormal findings of blood chemistry: Secondary | ICD-10-CM

## 2021-09-01 LAB — CBC
HCT: 43.2 % (ref 36.0–46.0)
Hemoglobin: 13.4 g/dL (ref 12.0–15.0)
MCH: 31.5 pg (ref 26.0–34.0)
MCHC: 31 g/dL (ref 30.0–36.0)
MCV: 101.6 fL — ABNORMAL HIGH (ref 80.0–100.0)
Platelets: 421 10*3/uL — ABNORMAL HIGH (ref 150–400)
RBC: 4.25 MIL/uL (ref 3.87–5.11)
RDW: 12.6 % (ref 11.5–15.5)
WBC: 6.1 10*3/uL (ref 4.0–10.5)
nRBC: 0 % (ref 0.0–0.2)

## 2021-09-01 NOTE — Patient Instructions (Signed)
[facility]  Discharge Instructions: Thank you for choosing [facility] to provide your oncology and hematology care.  If you have a lab appointment with the Cancer Center, please come in thru the Main Entrance and check in at the main information desk.  Wear comfortable clothing and clothing appropriate for easy access to any Portacath or PICC line.   We strive to give you quality time with your provider. You may need to reschedule your appointment if you arrive late (15 or more minutes).  Arriving late affects you and other patients whose appointments are after yours.  Also, if you miss three or more appointments without notifying the office, you may be dismissed from the clinic at the provider's discretion.      For prescription refill requests, have your pharmacy contact our office and allow 72 hours for refills to be completed.        To help prevent nausea and vomiting after your treatment, we encourage you to take your nausea medication as directed.  BELOW ARE SYMPTOMS THAT SHOULD BE REPORTED IMMEDIATELY: *FEVER GREATER THAN 100.4 F (38 C) OR HIGHER *CHILLS OR SWEATING *NAUSEA AND VOMITING THAT IS NOT CONTROLLED WITH YOUR NAUSEA MEDICATION *UNUSUAL SHORTNESS OF BREATH *UNUSUAL BRUISING OR BLEEDING *URINARY PROBLEMS (pain or burning when urinating, or frequent urination) *BOWEL PROBLEMS (unusual diarrhea, constipation, pain near the anus) TENDERNESS IN MOUTH AND THROAT WITH OR WITHOUT PRESENCE OF ULCERS (sore throat, sores in mouth, or a toothache) UNUSUAL RASH, SWELLING OR PAIN  UNUSUAL VAGINAL DISCHARGE OR ITCHING   Items with * indicate a potential emergency and should be followed up as soon as possible or go to the Emergency Department if any problems should occur.  Please show the CHEMOTHERAPY ALERT CARD or IMMUNOTHERAPY ALERT CARD at check-in to the Emergency Department and triage nurse.  Should you have questions after your visit or need to cancel  or reschedule your appointment, please contact  CANCER CENTER [phone]  and follow the prompts.  Office hours are 8:00 a.m. to 4:30 p.m. Monday - Friday. Please note that voicemails left after 4:00 p.m. may not be returned until the following business day.  We are closed weekends and major holidays. You have access to a nurse at all times for urgent questions. Please call the main number to the clinic [phone] and follow the prompts.  For any non-urgent questions, you may also contact your provider using MyChart. We now offer e-Visits for anyone 18 and older to request care online for non-urgent symptoms. For details visit [email or web address].   Also download the MyChart app! Go to the app store, search "MyChart", open the app, select [facility], and log in with your MyChart username and password.  Due to Covid, a mask is required upon entering the hospital/clinic. If you do not have a mask, one will be given to you upon arrival. For doctor visits, patients may have 1 support person aged 18 or older with them. For treatment visits, patients cannot have anyone with them due to current Covid guidelines and our immunocompromised population.  

## 2021-09-01 NOTE — Progress Notes (Signed)
Patient presents today for therapeutic phlebotomy.  Patient is in satisfactory condition with no complaints voiced.  Vital signs are stable.  Hemoglobin today is 13.4 and hematocrit is 43.2.  We will proceed with phlebotomy per MD orders.  ? ?Therapeutic phlebotomy started at 1435 and ended at 1444.  500 mL were drawn off of patient.  Vital signs remained stable after therapeutic phlebotomy.   ? ?Patient discharged ambulatory in stable condition.   ?

## 2021-09-08 ENCOUNTER — Encounter: Payer: Self-pay | Admitting: Nurse Practitioner

## 2021-09-08 ENCOUNTER — Ambulatory Visit (INDEPENDENT_AMBULATORY_CARE_PROVIDER_SITE_OTHER): Payer: No Typology Code available for payment source | Admitting: Nurse Practitioner

## 2021-09-08 VITALS — BP 116/72 | HR 91 | Ht 61.0 in | Wt 131.0 lb

## 2021-09-08 DIAGNOSIS — R030 Elevated blood-pressure reading, without diagnosis of hypertension: Secondary | ICD-10-CM

## 2021-09-08 DIAGNOSIS — E7841 Elevated Lipoprotein(a): Secondary | ICD-10-CM | POA: Diagnosis not present

## 2021-09-08 DIAGNOSIS — E875 Hyperkalemia: Secondary | ICD-10-CM | POA: Diagnosis not present

## 2021-09-08 NOTE — Patient Instructions (Signed)

## 2021-09-08 NOTE — Progress Notes (Signed)
? ?  Stephanie Ramos     MRN: [id number]      DOB: Aug 12, 1945 ? ? ?HPI ?[name] with medical history of hyperlipidemia, hyperkalemia, elevated ferritin level, hypercalcemia elevated BP without diagnosis of hypertension is here for follow up for hyperlipidemia.  ? ?The PT denies any adverse reactions to current medications since the last visit.  ?There are no new concerns.  ?There are no specific complaints  ? ?Patient refused referral for colon cancer screening, need for colon cancer screening discussed with patient she verbalized understanding.patient encouraged to get her shingles , TDAP and pneumonia vaccine at the pharmacy. ? ?ROS ?Denies recent fever or chills. ?Denies sinus pressure, nasal congestion, ear pain or sore throat. ?Denies chest congestion, productive cough or wheezing. ?Denies chest pains, palpitations and leg swelling ?Denies abdominal pain, nausea, vomiting,diarrhea or constipation.   ?Denies depression, anxiety or insomnia. ? ? ? ?PE ? ?BP (!) 148/82 (BP Location: Right Arm, Cuff Size: Normal)   Pulse 91   Ht 5\' 1"  (1.549 m)   Wt 131 lb (59.4 kg)   SpO2 99%   BMI 24.75 kg/m?  ? ?Patient alert and oriented and in no cardiopulmonary distress. ? ?Chest: Clear to auscultation bilaterally. ? ?CVS: S1, S2 no murmurs, no S3.Regular rate. ? ?ABD: Soft non tender.  ? ?Ext: No edema ? ?MS: Adequate ROM spine, shoulders, hips and knees. ? ?Psych: Good eye contact, normal affect. Memory intact not anxious or depressed appearing. ? ? ? ?Assessment & Plan ?Hyperlipemia ?Recently started on ezetimibe 10mg  daily ?On simvastatin 40mg  daily  ?Check lipid panel today .  ? ?Hypercalcemia ?She has been taking calcium- Calciferol 600mg -800mg  tablets once daily  , previously taking med BID  until her last labs ?recheck calcium level  ? ?Elevated BP without diagnosis of hypertension ?BP Readings from Last 3 Encounters:  ?09/08/21 116/72  ?09/01/21 123/68  ?08/18/21 (!) 122/55  ?Blood pressure within normal  limits today. ?DASH diet advised engage in regular exercise as tolerated ?  ? ?

## 2021-09-08 NOTE — Assessment & Plan Note (Signed)
BP Readings from Last 3 Encounters:  ?09/08/21 116/72  ?09/01/21 123/68  ?08/18/21 (!) 122/55  ?Blood pressure within normal limits today. ?DASH diet advised engage in regular exercise as tolerated ? ?

## 2021-09-08 NOTE — Assessment & Plan Note (Addendum)
She has been taking calcium- Calciferol 600mg -800mg  tablets once daily  , previously taking med BID  until her last labs ?recheck calcium level  ?

## 2021-09-08 NOTE — Assessment & Plan Note (Deleted)
Recheck potassium levels ?Bmp ordered ?

## 2021-09-08 NOTE — Assessment & Plan Note (Signed)
Recently started on ezetimibe 10mg  daily ?On simvastatin 40mg  daily  ?Check lipid panel today .  ?

## 2021-09-09 LAB — LIPID PANEL
Chol/HDL Ratio: 2.5 ratio (ref 0.0–4.4)
Cholesterol, Total: 162 mg/dL (ref 100–199)
HDL: 66 mg/dL (ref >39)
LDL Chol Calc (NIH): 81 mg/dL (ref 0–99)
Triglycerides: 79 mg/dL (ref 0–149)
VLDL Cholesterol Cal: 15 mg/dL (ref 5–40)

## 2021-09-09 LAB — BASIC METABOLIC PANEL
BUN/Creatinine Ratio: 24 (ref 12–28)
BUN: 16 mg/dL (ref 8–27)
CO2: 22 mmol/L (ref 20–29)
Calcium: 9.7 mg/dL (ref 8.7–10.3)
Chloride: 106 mmol/L (ref 96–106)
Creatinine, Ser: 0.66 mg/dL (ref 0.57–1.00)
Glucose: 102 mg/dL — ABNORMAL HIGH (ref 70–99)
Potassium: 4.4 mmol/L (ref 3.5–5.2)
Sodium: 145 mmol/L — ABNORMAL HIGH (ref 134–144)
eGFR: 91 mL/min/{1.73_m2} (ref >59)

## 2021-09-12 NOTE — Progress Notes (Signed)
Calcium level is back to normal ? ?LDL within normal limits.  ? ?Sodium slightly high , drink plenty of water to stay hydrated .  ? ?Pt should continue current medications.

## 2021-09-13 ENCOUNTER — Telehealth: Payer: Self-pay | Admitting: Nurse Practitioner

## 2021-09-13 NOTE — Telephone Encounter (Signed)
Spoke with pt

## 2021-09-13 NOTE — Telephone Encounter (Signed)
Patient returning a call from this morning.  ?

## 2021-09-15 ENCOUNTER — Inpatient Hospital Stay (HOSPITAL_COMMUNITY): Payer: No Typology Code available for payment source | Attending: Hematology

## 2021-09-15 ENCOUNTER — Other Ambulatory Visit: Payer: Self-pay

## 2021-09-15 DIAGNOSIS — R7989 Other specified abnormal findings of blood chemistry: Secondary | ICD-10-CM | POA: Insufficient documentation

## 2021-09-15 DIAGNOSIS — E7841 Elevated Lipoprotein(a): Secondary | ICD-10-CM

## 2021-09-15 MED ORDER — SIMVASTATIN 40 MG PO TABS: 40.0000 mg | ORAL_TABLET | Freq: Every day | ORAL | 2 refills | Status: DC

## 2021-09-15 NOTE — Patient Instructions (Signed)
[facility]  Discharge Instructions: ?Thank you for choosing [facility] to provide your oncology and hematology care.  ?If you have a lab appointment with the [facility], please come in thru the Main Entrance and check in at the main information desk. ? ?Wear comfortable clothing and clothing appropriate for easy access to any Portacath or PICC line.  ? ?We strive to give you quality time with your provider. You may need to reschedule your appointment if you arrive late (15 or more minutes).  Arriving late affects you and other patients whose appointments are after yours.  Also, if you miss three or more appointments without notifying the office, you may be dismissed from the clinic at the provider?s discretion.    ?  ?For prescription refill requests, have your pharmacy contact our office and allow 72 hours for refills to be completed.   ? ?Today you received the therapeutic phlebotomy.  ?  ? ? ?BELOW ARE SYMPTOMS THAT SHOULD BE REPORTED IMMEDIATELY: ?*FEVER GREATER THAN 100.4 F (38 ?C) OR HIGHER ?*CHILLS OR SWEATING ?*NAUSEA AND VOMITING THAT IS NOT CONTROLLED WITH YOUR NAUSEA MEDICATION ?*UNUSUAL SHORTNESS OF BREATH ?*UNUSUAL BRUISING OR BLEEDING ?*URINARY PROBLEMS (pain or burning when urinating, or frequent urination) ?*BOWEL PROBLEMS (unusual diarrhea, constipation, pain near the anus) ?TENDERNESS IN MOUTH AND THROAT WITH OR WITHOUT PRESENCE OF ULCERS (sore throat, sores in mouth, or a toothache) ?UNUSUAL RASH, SWELLING OR PAIN  ?UNUSUAL VAGINAL DISCHARGE OR ITCHING  ? ?Items with * indicate a potential emergency and should be followed up as soon as possible or go to the Emergency Department if any problems should occur. ? ?Please show the CHEMOTHERAPY ALERT CARD or IMMUNOTHERAPY ALERT CARD at check-in to the Emergency Department and triage nurse. ? ?Should you have questions after your visit or need to cancel or reschedule your appointment, please contact [facility] [phone]  and follow the prompts.  Office hours are 8:00 a.m. to 4:30 p.m. Monday - Friday. Please note that voicemails left after 4:00 p.m. may not be returned until the following business day.  We are closed weekends and major holidays. You have access to a nurse at all times for urgent questions. Please call the main number to the clinic [phone] and follow the prompts. ? ?For any non-urgent questions, you may also contact your provider using MyChart. We now offer e-Visits for anyone 40 and older to request care online for non-urgent symptoms. For details visit [email or web address]. ?  ?Also download the MyChart app! Go to the app store, search "MyChart", open the app, select [facility], and log in with your MyChart username and password. ? ?Due to Covid, a mask is required upon entering the hospital/clinic. If you do not have a mask, one will be given to you upon arrival. For doctor visits, patients may have 1 support person aged 55 or older with them. For treatment visits, patients cannot have anyone with them due to current Covid guidelines and our immunocompromised population.  ?

## 2021-09-15 NOTE — Progress Notes (Signed)
[name] presents today for theraputic phlebotomy per MD orders. Last hgb 13.4/hct 43.2 was on 09/01/21  .VSS prior to procedure. Pt reports eating before arrival. Procedure started at 1435 using patients left AC. 500 mL of blood removed. Procedure ended at 1445. Gauze and coban applied to [facility], site clean and dry. VSS upon completion of procedure. Pt denies dizziness, lightheadedness, or feeling faint. Observed for 30 minutes post procedure. Discharged in satisfactory condition with follow up instructions.  ?

## 2021-09-27 ENCOUNTER — Other Ambulatory Visit: Payer: Self-pay | Admitting: Family Medicine

## 2021-09-27 DIAGNOSIS — E7841 Elevated Lipoprotein(a): Secondary | ICD-10-CM

## 2021-10-02 NOTE — Progress Notes (Signed)
? ?[facility] ?[address] ?[place], [place] [address] ? ? ?CLINIC:  ?Medical Oncology/Hematology ? ?PCP:  ?Donell BeersPaseda, Folashade R, FNP ?[address] ?[place] [id number] ?[phone] ? ? ?REASON FOR VISIT:  ?Follow-up for iron overload ? ?PRIOR THERAPY: None ? ?CURRENT THERAPY: Phlebotomy every 2 weeks ? ?INTERVAL HISTORY:  ?Ms. Stephanie FearFerguson [age] y.o. female returns for routine follow-up of her elevated ferritin and iron overload.  She was last seen by Judge Stallebekah Penningotn, PA-C on 08/02/2021.   ? ?At today's visit, she reports feeling fairly well.   ?She has received phlebotomy x 4 since her last visit, which she is tolerating well.  She reports that she will have some fatigue for about a day after phlebotomy, but after that she recovers well.   ? ?She has not had any changes in her baseline health status since her last visit. ?She denies hematochezia, hematuria, and black stools. ?She denies joint pains and skin changes.    ?She denies any abdominal pain. ?She reports mild fatigue with energy about 80%, but states that this is related to increased stress from taking care of her husband, who had a heart attack several months ago. ? ?She has 80% energy and 100% appetite. She endorses that she is maintaining a stable weight. ? ? ?REVIEW OF SYSTEMS:  ?Review of Systems  ?Constitutional:  Positive for fatigue (energy 80%). Negative for appetite change, chills, diaphoresis, fever and unexpected weight change.  ?HENT:   Negative for lump/mass and nosebleeds.   ?Eyes:  Negative for eye problems.  ?Respiratory:  Negative for cough, hemoptysis and shortness of breath.   ?Cardiovascular:  Negative for chest pain, leg swelling and palpitations.  ?Gastrointestinal:  Negative for abdominal pain, blood in stool, constipation, diarrhea, nausea and vomiting.  ?Genitourinary:  Negative for hematuria.   ?Skin: Negative.   ?Neurological:  Negative for dizziness, headaches and light-headedness.   ?Hematological:  Does not bruise/bleed easily.   ? ? ?PAST MEDICAL/SURGICAL HISTORY:  ?Past Medical History:  ?Diagnosis Date  ? High iron content of liver determined by magnetic resonance imaging 08/02/2021  ? Hyperlipemia   ? ?Past Surgical History:  ?Procedure Laterality Date  ? BREAST SURGERY    ? CATARACT EXTRACTION W/PHACO Left 08/11/2019  ? Procedure: CATARACT EXTRACTION PHACO AND INTRAOCULAR LENS PLACEMENT (IOC) (CDE: 9.68);  Surgeon: Fabio PierceWrzosek, James, MD;  Location: [facility];  Service: Ophthalmology;  Laterality: Left;  ? CATARACT EXTRACTION W/PHACO Right 09/29/2019  ? Procedure: CATARACT EXTRACTION PHACO AND INTRAOCULAR LENS PLACEMENT RIGHT EYE;  Surgeon: Fabio PierceWrzosek, James, MD;  Location: [facility];  Service: Ophthalmology;  Laterality: Right;  CDE: 10.60  ? DILITATION & CURRETTAGE/HYSTROSCOPY WITH ESSURE    ? ? ? ?SOCIAL HISTORY:  ?Social History  ? ?Socioeconomic History  ? Marital status: Married  ?  Spouse name: Not on file  ? Number of children: 1  ? Years of education: Not on file  ? Highest education level: Not on file  ?Occupational History  ? Not on file  ?Tobacco Use  ? Smoking status: Never  ? Smokeless tobacco: Never  ?Substance and Sexual Activity  ? Alcohol use: Never  ? Drug use: Never  ? Sexual activity: Not on file  ?Other Topics Concern  ? Not on file  ?Social History Narrative  ? Lives with husband of 49 years  ? They have one child and one grandchild  ?   ? She enjoys reading romance novels  ?   ? Diet: all  food groups  ? Caffeine: Limited  ? Water: 1/2 gallon or more a day  ?   ? Wears seat belt  ? Does not use phone while driving  ? Smoke detectors at home  ? Government social research officer   ? Weapons safe box   ? ?Social Determinants of Health  ? ?Financial Resource Strain: Low Risk   ? Difficulty of Paying Living Expenses: Not hard at all  ?Food Insecurity: No Food Insecurity  ? Worried About Programme researcher, broadcasting/film/video in the Last Year: Never true  ? Ran Out of Food in the Last Year: Never true  ?Transportation  Needs: No Transportation Needs  ? Lack of Transportation (Medical): No  ? Lack of Transportation (Non-Medical): No  ?Physical Activity: Sufficiently Active  ? Days of Exercise per Week: 6 days  ? Minutes of Exercise per Session: 30 min  ?Stress: No Stress Concern Present  ? Feeling of Stress : Not at all  ?Social Connections: Moderately Integrated  ? Frequency of Communication with Friends and Family: More than three times a week  ? Frequency of Social Gatherings with Friends and Family: More than three times a week  ? Attends Religious Services: More than 4 times per year  ? Active Member of Clubs or Organizations: No  ? Attends Banker Meetings: Never  ? Marital Status: Married  ?Intimate Partner Violence: Not At Risk  ? Ramos of Current or Ex-Partner: No  ? Emotionally Abused: No  ? Physically Abused: No  ? Sexually Abused: No  ? ? ?FAMILY HISTORY:  ?No family history on file. ? ?CURRENT MEDICATIONS:  ?Outpatient Encounter Medications as of 10/04/2021  ?Medication Sig  ? Calcium Carb-Cholecalciferol 600-800 MG-UNIT TABS Take 1 tablet by mouth in the morning and at bedtime.  ? cyanocobalamin 1000 MCG tablet Take 1,000 mcg by mouth daily.  ? ezetimibe (ZETIA) 10 MG tablet Take 1 tablet (10 mg total) by mouth daily.  ? LOKELMA 5 g packet Take 1 packet by mouth once.  ? LORazepam (ATIVAN) 1 MG tablet Take 1 tablet (1 mg total) by mouth as directed. Take 30 minutes before your MRI  ? meclizine (ANTIVERT) 25 MG tablet Take 1 tablet (25 mg total) by mouth 3 (three) times daily as needed for dizziness.  ? omega-3 acid ethyl esters (LOVAZA) 1 g capsule Take by mouth 2 (two) times daily.  ? simvastatin (ZOCOR) 40 MG tablet Take 1 tablet by mouth once daily  ? traZODone (DESYREL) 50 MG tablet Take 0.5-1 tablets (25-50 mg total) by mouth at bedtime as needed for sleep.  ? Vitamin A 2400 MCG (8000 UT) TABS Take 8,000 Units by mouth daily.  ? vitamin C (ASCORBIC ACID) 500 MG tablet Take 500 mg by mouth every morning.   ? Vitamin E 400 units TABS Take 400 Units by mouth daily.  ? ?No facility-administered encounter medications on file as of 10/04/2021.  ? ? ?ALLERGIES:  ?Allergies  ?Allergen Reactions  ? Sulfa Antibiotics Rash  ? ? ? ?PHYSICAL EXAM:  ?ECOG PERFORMANCE STATUS: 0 - Asymptomatic ? ?There were no vitals filed for this visit. ?There were no vitals filed for this visit. ?Physical Exam ?Constitutional:   ?   Appearance: Normal appearance.  ?HENT:  ?   Head: Normocephalic and atraumatic.  ?   Mouth/Throat:  ?   Mouth: Mucous membranes are moist.  ?Eyes:  ?   Extraocular Movements: Extraocular movements intact.  ?   Pupils: Pupils are equal, round, and reactive  to light.  ?Cardiovascular:  ?   Rate and Rhythm: Normal rate and regular rhythm.  ?   Pulses: Normal pulses.  ?   Heart sounds: Normal heart sounds.  ?Pulmonary:  ?   Effort: Pulmonary effort is normal.  ?   Breath sounds: Normal breath sounds.  ?Abdominal:  ?   General: Bowel sounds are normal.  ?   Palpations: Abdomen is soft.  ?   Tenderness: There is no abdominal tenderness.  ?Musculoskeletal:     ?   General: No swelling.  ?   Right lower leg: No edema.  ?   Left lower leg: No edema.  ?Lymphadenopathy:  ?   Cervical: No cervical adenopathy.  ?Skin: ?   General: Skin is warm and dry.  ?Neurological:  ?   General: No focal deficit present.  ?   Mental Status: She is alert and oriented to person, place, and time.  ?Psychiatric:     ?   Mood and Affect: Mood normal.     ?   Behavior: Behavior normal.  ? ? ? ?LABORATORY DATA:  ?I have reviewed the labs as listed.  ?CBC ?   ?Component Value Date/Time  ? WBC 6.1 09/01/2021 1308  ? RBC 4.25 09/01/2021 1308  ? HGB 13.4 09/01/2021 1308  ? HGB 15.6 07/19/2021 1016  ? HCT 43.2 09/01/2021 1308  ? HCT 47.6 (H) 07/19/2021 1016  ? PLT 421 (H) 09/01/2021 1308  ? PLT 478 (H) 07/19/2021 1016  ? MCV 101.6 (H) 09/01/2021 1308  ? MCV 96 07/19/2021 1016  ? MCH 31.5 09/01/2021 1308  ? MCHC 31.0 09/01/2021 1308  ? RDW 12.6 09/01/2021  1308  ? RDW 13.3 07/19/2021 1016  ? LYMPHSABS 2.0 08/02/2021 1318  ? LYMPHSABS 1.8 07/19/2021 1016  ? MONOABS 0.7 08/02/2021 1318  ? EOSABS 0.1 08/02/2021 1318  ? EOSABS 0.1 07/19/2021 1016  ? BASOSABS 0.0 08/02/2021

## 2021-10-03 ENCOUNTER — Ambulatory Visit (INDEPENDENT_AMBULATORY_CARE_PROVIDER_SITE_OTHER): Payer: No Typology Code available for payment source

## 2021-10-03 DIAGNOSIS — Z0000 Encounter for general adult medical examination without abnormal findings: Secondary | ICD-10-CM | POA: Diagnosis not present

## 2021-10-03 NOTE — Patient Instructions (Signed)
Stephanie Ramos , ?Thank you for taking time to come for your Medicare Wellness Visit. I appreciate your ongoing commitment to your health goals. Please review the following plan we discussed and let me know if I can assist you in the future.  ? ?Screening recommendations/referrals: ?Colonoscopy: Due now ?Mammogram: Complete ?Bone Density: Complete ?Recommended yearly ophthalmology/optometry visit for glaucoma screening and checkup ?Recommended yearly dental visit for hygiene and checkup ? ?Vaccinations: ?Influenza vaccine: Complete ?Pneumococcal vaccine: Due now ?Tdap vaccine: Due now  ?Shingles vaccine: Due now    ? ?Advanced directives: Patient declined. ? ?Conditions/risks identified: Hyperlipidemia ? ?Next appointment: 1 year  ? ? ?Stephanie Ramos , ?Thank you for taking time to come for your Medicare Wellness Visit. I appreciate your ongoing commitment to your health goals. Please review the following plan we discussed and let me know if I can assist you in the future.  ? ?These are the goals we discussed: ? Goals   ? ?  Patient Stated   ?  None at this time. ?  ? ?  ?  ?This is a list of the screening recommended for you and due dates:  ?Health Maintenance  ?Topic Date Due  ? Tetanus Vaccine  Never done  ? Zoster (Shingles) Vaccine (1 of 2) Never done  ? Colon Cancer Screening  Never done  ? Pneumonia Vaccine (1 - PCV) Never done  ? Flu Shot  01/03/2022  ? DEXA scan (bone density measurement)  Completed  ? Hepatitis C Screening: USPSTF Recommendation to screen - Ages 55-79 yo.  Completed  ? HPV Vaccine  Aged Out  ? COVID-19 Vaccine  Discontinued  ?  ? ?Preventive Care 10 Years and Older, Female ?Preventive care refers to lifestyle choices and visits with your health care provider that can promote health and wellness. ?What does preventive care include? ?A yearly physical exam. This is also called an annual well check. ?Dental exams once or twice a year. ?Routine eye exams. Ask your health care provider how often  you should have your eyes checked. ?Personal lifestyle choices, including: ?Daily care of your teeth and gums. ?Regular physical activity. ?Eating a healthy diet. ?Avoiding tobacco and drug use. ?Limiting alcohol use. ?Practicing safe sex. ?Taking low-dose aspirin every day. ?Taking vitamin and mineral supplements as recommended by your health care provider. ?What happens during an annual well check? ?The services and screenings done by your health care provider during your annual well check will depend on your age, overall health, lifestyle risk factors, and family history of disease. ?Counseling  ?Your health care provider may ask you questions about your: ?Alcohol use. ?Tobacco use. ?Drug use. ?Emotional well-being. ?Home and relationship well-being. ?Sexual activity. ?Eating habits. ?History of falls. ?Memory and ability to understand (cognition). ?Work and work Astronomer. ?Reproductive health. ?Screening  ?You may have the following tests or measurements: ?Height, weight, and BMI. ?Blood pressure. ?Lipid and cholesterol levels. These may be checked every 5 years, or more frequently if you are over 107 years old. ?Skin check. ?Lung cancer screening. You may have this screening every year starting at age 57 if you have a 30-pack-year history of smoking and currently smoke or have quit within the past 15 years. ?Fecal occult blood test (FOBT) of the stool. You may have this test every year starting at age 37. ?Flexible sigmoidoscopy or colonoscopy. You may have a sigmoidoscopy every 5 years or a colonoscopy every 10 years starting at age 12. ?Hepatitis C blood test. ?Hepatitis B blood test. ?  Sexually transmitted disease (STD) testing. ?Diabetes screening. This is done by checking your blood sugar (glucose) after you have not eaten for a while (fasting). You may have this done every 1-3 years. ?Bone density scan. This is done to screen for osteoporosis. You may have this done starting at age 32. ?Mammogram. This  may be done every 1-2 years. Talk to your health care provider about how often you should have regular mammograms. ?Talk with your health care provider about your test results, treatment options, and if necessary, the need for more tests. ?Vaccines  ?Your health care provider may recommend certain vaccines, such as: ?Influenza vaccine. This is recommended every year. ?Tetanus, diphtheria, and acellular pertussis (Tdap, Td) vaccine. You may need a Td booster every 10 years. ?Zoster vaccine. You may need this after age 11. ?Pneumococcal 13-valent conjugate (PCV13) vaccine. One dose is recommended after age 49. ?Pneumococcal polysaccharide (PPSV23) vaccine. One dose is recommended after age 55. ?Talk to your health care provider about which screenings and vaccines you need and how often you need them. ?This information is not intended to replace advice given to you by your health care provider. Make sure you discuss any questions you have with your health care provider. ?Document Released: 06/18/2015 Document Revised: 02/09/2016 Document Reviewed: 03/23/2015 ?Elsevier Interactive Patient Education ? 2017 [employer] ? ?Fall Prevention in the Home ?Falls can cause injuries. They can happen to people of all ages. There are many things you can do to make your home safe and to help prevent falls. ?What can I do on the outside of my home? ?Regularly fix the edges of walkways and driveways and fix any cracks. ?Remove anything that might make you trip as you walk through a door, such as a raised step or threshold. ?Trim any bushes or trees on the path to your home. ?Use bright outdoor lighting. ?Clear any walking paths of anything that might make someone trip, such as rocks or tools. ?Regularly check to see if handrails are loose or broken. Make sure that both sides of any steps have handrails. ?Any raised decks and porches should have guardrails on the edges. ?Have any leaves, snow, or ice cleared regularly. ?Use sand or  salt on walking paths during winter. ?Clean up any spills in your garage right away. This includes oil or grease spills. ?What can I do in the bathroom? ?Use night lights. ?Install grab bars by the toilet and in the tub and shower. Do not use towel bars as grab bars. ?Use non-skid mats or decals in the tub or shower. ?If you need to sit down in the shower, use a plastic, non-slip stool. ?Keep the floor dry. Clean up any water that spills on the floor as soon as it happens. ?Remove soap buildup in the tub or shower regularly. ?Attach bath mats securely with double-sided non-slip rug tape. ?Do not have throw rugs and other things on the floor that can make you trip. ?What can I do in the bedroom? ?Use night lights. ?Make sure that you have a light by your bed that is easy to reach. ?Do not use any sheets or blankets that are too big for your bed. They should not hang down onto the floor. ?Have a firm chair that has side arms. You can use this for support while you get dressed. ?Do not have throw rugs and other things on the floor that can make you trip. ?What can I do in the kitchen? ?Clean up any spills right away. ?  Avoid walking on wet floors. ?Keep items that you use a lot in easy-to-reach places. ?If you need to reach something above you, use a strong step stool that has a grab bar. ?Keep electrical cords out of the way. ?Do not use floor polish or wax that makes floors slippery. If you must use wax, use non-skid floor wax. ?Do not have throw rugs and other things on the floor that can make you trip. ?What can I do with my stairs? ?Do not leave any items on the stairs. ?Make sure that there are handrails on both sides of the stairs and use them. Fix handrails that are broken or loose. Make sure that handrails are as long as the stairways. ?Check any carpeting to make sure that it is firmly attached to the stairs. Fix any carpet that is loose or worn. ?Avoid having throw rugs at the top or bottom of the stairs. If  you do have throw rugs, attach them to the floor with carpet tape. ?Make sure that you have a light switch at the top of the stairs and the bottom of the stairs. If you do not have them, ask someone to ad

## 2021-10-03 NOTE — Progress Notes (Signed)
? ?Subjective:  ? Stephanie Ramos is a 76 y.o. female who presents for Medicare Annual (Subsequent) preventive examination. ? ?Review of Systems    ?I connected with  Stephanie Ramos on 10/03/21 by a audio enabled telemedicine application and verified that I am speaking with the correct person using two identifiers. ? ?Patient Location: Home ? ?Provider Location: Office/Clinic ? ?I discussed the limitations of evaluation and management by telemedicine. The patient expressed understanding and agreed to proceed.  ?Cardiac Risk Factors include: none ? ?   ?Objective:  ?  ?Today's Vitals  ? 10/03/21 1012 10/03/21 1014  ?PainSc: 0-No pain 0-No pain  ? ?There is no height or weight on file to calculate BMI. ? ? ?  10/03/2021  ? 10:17 AM 09/15/2021  ?  3:53 PM 09/01/2021  ?  2:28 PM 08/18/2021  ?  3:34 PM 08/02/2021  ?  3:06 PM 07/14/2021  ?  9:06 AM 06/24/2021  ? 11:35 AM  ?Advanced Directives  ?Does Patient Have a Medical Advance Directive? No No No No No No No  ?Would patient like information on creating a medical advance directive? No - Patient declined No - Patient declined No - Patient declined No - Patient declined No - Patient declined No - Patient declined No - Patient declined  ? ? ?Current Medications (verified) ?Outpatient Encounter Medications as of 10/03/2021  ?Medication Sig  ? Calcium Carb-Cholecalciferol 600-800 MG-UNIT TABS Take 1 tablet by mouth in the morning and at bedtime.  ? cyanocobalamin 1000 MCG tablet Take 1,000 mcg by mouth daily.  ? ezetimibe (ZETIA) 10 MG tablet Take 1 tablet (10 mg total) by mouth daily.  ? LOKELMA 5 g packet Take 1 packet by mouth once.  ? LORazepam (ATIVAN) 1 MG tablet Take 1 tablet (1 mg total) by mouth as directed. Take 30 minutes before your MRI  ? meclizine (ANTIVERT) 25 MG tablet Take 1 tablet (25 mg total) by mouth 3 (three) times daily as needed for dizziness.  ? omega-3 acid ethyl esters (LOVAZA) 1 g capsule Take by mouth 2 (two) times daily.  ? simvastatin (ZOCOR) 40  MG tablet Take 1 tablet by mouth once daily  ? traZODone (DESYREL) 50 MG tablet Take 0.5-1 tablets (25-50 mg total) by mouth at bedtime as needed for sleep.  ? Vitamin A 2400 MCG (8000 UT) TABS Take 8,000 Units by mouth daily.  ? vitamin C (ASCORBIC ACID) 500 MG tablet Take 500 mg by mouth every morning.  ? Vitamin E 400 units TABS Take 400 Units by mouth daily.  ? ?No facility-administered encounter medications on file as of 10/03/2021.  ? ? ?Allergies (verified) ?Sulfa antibiotics  ? ?History: ?Past Medical History:  ?Diagnosis Date  ? High iron content of liver determined by magnetic resonance imaging 08/02/2021  ? Hyperlipemia   ? ?Past Surgical History:  ?Procedure Laterality Date  ? BREAST SURGERY    ? CATARACT EXTRACTION W/PHACO Left 08/11/2019  ? Procedure: CATARACT EXTRACTION PHACO AND INTRAOCULAR LENS PLACEMENT (IOC) (CDE: 9.68);  Surgeon: Baruch Goldmann, MD;  Location: [facility];  Service: Ophthalmology;  Laterality: Left;  ? CATARACT EXTRACTION W/PHACO Right 09/29/2019  ? Procedure: CATARACT EXTRACTION PHACO AND INTRAOCULAR LENS PLACEMENT RIGHT EYE;  Surgeon: Baruch Goldmann, MD;  Location: [facility];  Service: Ophthalmology;  Laterality: Right;  CDE: 10.60  ? DILITATION & CURRETTAGE/HYSTROSCOPY WITH ESSURE    ? ?History reviewed. No pertinent family history. ?Social History  ? ?Socioeconomic History  ? Marital status: Married  ?  Spouse name: Not on file  ? Number of children: 1  ? Years of education: Not on file  ? Highest education level: Not on file  ?Occupational History  ? Not on file  ?Tobacco Use  ? Smoking status: Never  ? Smokeless tobacco: Never  ?Substance and Sexual Activity  ? Alcohol use: Never  ? Drug use: Never  ? Sexual activity: Not on file  ?Other Topics Concern  ? Not on file  ?Social History Narrative  ? Lives with husband of 11 years  ? They have one child and one grandchild  ?   ? She enjoys reading romance novels  ?   ? Diet: all food groups  ? Caffeine: Limited  ? Water: 1/2 gallon or more a  day  ?   ? Wears seat belt  ? Does not use phone while driving  ? Smoke detectors at home  ? Data processing manager   ? Weapons safe box   ? ?Social Determinants of Health  ? ?Financial Resource Strain: Low Risk   ? Difficulty of Paying Living Expenses: Not hard at all  ?Food Insecurity: No Food Insecurity  ? Worried About Charity fundraiser in the Last Year: Never true  ? Ran Out of Food in the Last Year: Never true  ?Transportation Needs: No Transportation Needs  ? Lack of Transportation (Medical): No  ? Lack of Transportation (Non-Medical): No  ?Physical Activity: Sufficiently Active  ? Days of Exercise per Week: 7 days  ? Minutes of Exercise per Session: 40 min  ?Stress: No Stress Concern Present  ? Feeling of Stress : Not at all  ?Social Connections: Moderately Integrated  ? Frequency of Communication with Friends and Family: More than three times a week  ? Frequency of Social Gatherings with Friends and Family: More than three times a week  ? Attends Religious Services: More than 4 times per year  ? Active Member of Clubs or Organizations: No  ? Attends Archivist Meetings: Never  ? Marital Status: Married  ? ? ?Tobacco Counseling ?Counseling given: Not Answered ? ? ?Clinical Intake: ? ?Pre-visit preparation completed: Yes ?[name] , ?Thank you for taking time to come for your Medicare Wellness Visit. I appreciate your ongoing commitment to your health goals. Please review the following plan we discussed and let me know if I can assist you in the future.  ? ?These are the goals we discussed: ? Goals   ? ?  Patient Stated   ?  None at this time. ?  ? ?  ?  ?This is a list of the screening recommended for you and due dates:  ?Health Maintenance  ?Topic Date Due  ? Tetanus Vaccine  Never done  ? Zoster (Shingles) Vaccine (1 of 2) Never done  ? Colon Cancer Screening  Never done  ? Pneumonia Vaccine (1 - PCV) Never done  ? Flu Shot  01/03/2022  ? DEXA scan (bone density measurement)  Completed  ?  Hepatitis C Screening: USPSTF Recommendation to screen - Ages 61-79 yo.  Completed  ? HPV Vaccine  Aged Out  ? COVID-19 Vaccine  Discontinued  ?  ? ?Pain : No/denies pain ?Pain Score: 0-No pain ? ?  ? ?BMI - recorded: 24.76 ?Nutritional Status: BMI of 19-24  Normal ?Nutritional Risks: None ?Diabetes: No ? ?How often do you need to have someone help you when you read instructions, pamphlets, or other written materials from your doctor or pharmacy?: 1 -  Never ?What is the last grade level you completed in school?: 12 ? ?Diabetic?no ? ?Interpreter Needed?: No ? ?  ? ? ?Activities of Daily Living ? ?  10/03/2021  ? 10:18 AM  ?In your present state of health, do you have any difficulty performing the following activities:  ?Hearing? 0  ?Vision? 1  ?Difficulty concentrating or making decisions? 0  ?Walking or climbing stairs? 0  ?Dressing or bathing? 0  ?Doing errands, shopping? 0  ?Preparing Food and eating ? N  ?Using the Toilet? N  ?In the past six months, have you accidently leaked urine? N  ?Do you have problems with loss of bowel control? N  ?Managing your Medications? N  ?Managing your Finances? N  ?Housekeeping or managing your Housekeeping? N  ? ? ?Patient Care Team: ?Renee Rival, FNP as PCP - General (Nurse Practitioner) ?Derek Jack, MD as Medical Oncologist (Hematology) ? ?Indicate any recent Medical Services you may have received from other than [facility] providers in the past year (date may be approximate). ? ?   ?Assessment:  ? This is a routine wellness examination for Maerene. ? ?Hearing/Vision screen ?No results found. ? ?Dietary issues and exercise activities discussed: ?Current Exercise Habits: Home exercise routine, Type of exercise: walking, Time (Minutes): 40, Frequency (Times/Week): 7, Weekly Exercise (Minutes/Week): 280, Intensity: Mild, Exercise limited by: None identified ? ? Goals Addressed   ? ?  ?  ?  ?  ? This Visit's Progress  ?  Patient Stated     ?  None at this time. ?  ? ?   ?Depression Screen ? ?  10/03/2021  ? 10:18 AM 10/03/2021  ? 10:16 AM 09/08/2021  ? 10:06 AM 07/21/2021  ? 10:34 AM 05/20/2021  ? 11:40 AM 01/18/2021  ?  2:01 PM 09/29/2020  ? 11:04 AM  ?PHQ 2/9 Scores  ?PHQ - 2 [place]

## 2021-10-04 ENCOUNTER — Inpatient Hospital Stay (HOSPITAL_COMMUNITY): Payer: No Typology Code available for payment source | Attending: Hematology

## 2021-10-04 ENCOUNTER — Inpatient Hospital Stay (HOSPITAL_COMMUNITY): Payer: No Typology Code available for payment source

## 2021-10-04 ENCOUNTER — Inpatient Hospital Stay (HOSPITAL_BASED_OUTPATIENT_CLINIC_OR_DEPARTMENT_OTHER): Payer: No Typology Code available for payment source | Admitting: Physician Assistant

## 2021-10-04 DIAGNOSIS — R7989 Other specified abnormal findings of blood chemistry: Secondary | ICD-10-CM | POA: Insufficient documentation

## 2021-10-04 LAB — CBC
HCT: 42.3 % (ref 36.0–46.0)
Hemoglobin: 13.1 g/dL (ref 12.0–15.0)
MCH: 31.6 pg (ref 26.0–34.0)
MCHC: 31 g/dL (ref 30.0–36.0)
MCV: 101.9 fL — ABNORMAL HIGH (ref 80.0–100.0)
Platelets: 407 10*3/uL — ABNORMAL HIGH (ref 150–400)
RBC: 4.15 MIL/uL (ref 3.87–5.11)
RDW: 12.1 % (ref 11.5–15.5)
WBC: 6.3 10*3/uL (ref 4.0–10.5)
nRBC: 0 % (ref 0.0–0.2)

## 2021-10-04 LAB — IRON AND TIBC
Iron: 39 ug/dL (ref 28–170)
Saturation Ratios: 10 % — ABNORMAL LOW (ref 10.4–31.8)
TIBC: 373 ug/dL (ref 250–450)
UIBC: 334 ug/dL

## 2021-10-04 LAB — FERRITIN: Ferritin: 455 ng/mL — ABNORMAL HIGH (ref 11–307)

## 2021-10-04 NOTE — Patient Instructions (Signed)
Stephanie Ramos at [facility] ?Discharge Instructions ? ?You were seen today by Tarri Abernethy PA-C for your iron overload (hemochromatosis). ? ?Your ferritin level is IMPROVED!  We will continue phlebotomy (taking blood) once every 2 weeks.  This will help to decrease the amount of iron in your body over time. ? ?LABS: Labs once per month ? ?FOLLOW-UP APPOINTMENT: Office visit in about 2 months ? ? ?Thank you for choosing [place] at [facility] to provide your oncology and hematology care.  To afford each patient quality time with our provider, please arrive at least 15 minutes before your scheduled appointment time.  ? ?If you have a lab appointment with the [facility] please come in thru the Main Entrance and check in at the main information desk. ? ?You need to re-schedule your appointment should you arrive 10 or more minutes late.  We strive to give you quality time with our providers, and arriving late affects you and other patients whose appointments are after yours.  Also, if you no show three or more times for appointments you may be dismissed from the clinic at the providers discretion.     ?Again, thank you for choosing [facility].  Our hope is that these requests will decrease the amount of time that you wait before being seen by our physicians.       ?_____________________________________________________________ ? ?Should you have questions after your visit to [facility], please contact our office at [phone] and follow the prompts.  Our office hours are 8:00 a.m. and 4:30 p.m. Monday - Friday.  Please note that voicemails left after 4:00 p.m. may not be returned until the following business day.  We are closed weekends and major holidays.  You do have access to a nurse 24-7, just call the main number to the clinic [phone] and do not press any options, hold on the line and a nurse will answer the phone.   ? ?For  prescription refill requests, have your pharmacy contact our office and allow 72 hours.   ? ?Due to Covid, you will need to wear a mask upon entering the hospital. If you do not have a mask, a mask will be given to you at the Main Entrance upon arrival. For doctor visits, patients may have 1 support person age 55 or older with them. For treatment visits, patients can not have anyone with them due to social distancing guidelines and our immunocompromised population.  ? ? ? ?

## 2021-10-04 NOTE — Progress Notes (Signed)
Stephanie Ramos presents today for theraputic phlebotomy per MD orders. Last hgb 13.1 /hct 42.3 was on today .VSS prior to procedure. Pt reports eating before arrival. Procedure started at 1439 using patients right AC. 480 mL of blood removed. Procedure ended at 1503. Gauze and coban applied to Stephanie Ramos, site clean and dry. VSS upon completion of procedure. Pt denies dizziness, lightheadedness, or feeling faint.  Discharged in satisfactory condition with follow up instructions.  ?

## 2021-10-04 NOTE — Patient Instructions (Signed)
[facility]  Discharge Instructions: ?Thank you for choosing [facility] to provide your oncology and hematology care.  ?If you have a lab appointment with the Cancer Center, please come in thru the Main Entrance and check in at the main information desk. ? ?Wear comfortable clothing and clothing appropriate for easy access to any Portacath or PICC line.  ? ?We strive to give you quality time with your provider. You may need to reschedule your appointment if you arrive late (15 or more minutes).  Arriving late affects you and other patients whose appointments are after yours.  Also, if you miss three or more appointments without notifying the office, you may be dismissed from the clinic at the provider?s discretion.    ?  ?For prescription refill requests, have your pharmacy contact our office and allow 72 hours for refills to be completed.   ? ?Today you received therapeutic phlebotomy  ? ?BELOW ARE SYMPTOMS THAT SHOULD BE REPORTED IMMEDIATELY: ?*FEVER GREATER THAN 100.4 F (38 ?C) OR HIGHER ?*CHILLS OR SWEATING ?*NAUSEA AND VOMITING THAT IS NOT CONTROLLED WITH YOUR NAUSEA MEDICATION ?*UNUSUAL SHORTNESS OF BREATH ?*UNUSUAL BRUISING OR BLEEDING ?*URINARY PROBLEMS (pain or burning when urinating, or frequent urination) ?*BOWEL PROBLEMS (unusual diarrhea, constipation, pain near the anus) ?TENDERNESS IN MOUTH AND THROAT WITH OR WITHOUT PRESENCE OF ULCERS (sore throat, sores in mouth, or a toothache) ?UNUSUAL RASH, SWELLING OR PAIN  ?UNUSUAL VAGINAL DISCHARGE OR ITCHING  ? ?Items with * indicate a potential emergency and should be followed up as soon as possible or go to the Emergency Department if any problems should occur. ? ?Please show the CHEMOTHERAPY ALERT CARD or IMMUNOTHERAPY ALERT CARD at check-in to the Emergency Department and triage nurse. ? ?Should you have questions after your visit or need to cancel or reschedule your appointment, please contact  [facility]ANC[facility]ENTER  [phone]  and follow the prompts.  Office hours are 8:00 a.m. to 4:30 p.m. Monday - Friday. Please note that voicemails left after 4:00 p.m. may not be returned until the following business day.  We are closed weekends and major holidays. You have access to a nurse at all times for urgent questions. Please call the main number to the clinic [phone] and follow the prompts. ? ?For any non-urgent questions, you may also contact your provider using MyChart. We now offer e-Visits for anyone 18 and older to request care online for non-urgent symptoms. For details visit [email or web address]. ?  ?Also download the MyChart app! Go to the app store, search "MyChart", open the app, select [place], and log in with your MyChart username and password. ? ?Due to Covid, a mask is required upon entering the hospital/clinic. If you do not have a mask, one will be given to you upon arrival. For doctor visits, patients may have 1 support person aged 18 or older with them. For treatment visits, patients cannot have anyone with them due to current Covid guidelines and our immunocompromised population.  ?

## 2021-10-18 ENCOUNTER — Encounter (HOSPITAL_COMMUNITY): Payer: Self-pay

## 2021-10-18 ENCOUNTER — Inpatient Hospital Stay (HOSPITAL_COMMUNITY): Payer: No Typology Code available for payment source

## 2021-10-18 DIAGNOSIS — R7989 Other specified abnormal findings of blood chemistry: Secondary | ICD-10-CM | POA: Diagnosis not present

## 2021-10-18 NOTE — Patient Instructions (Signed)
[facility]  Discharge Instructions: ?Thank you for choosing [facility] to provide your oncology and hematology care.  ?If you have a lab appointment with the Cancer Center, please come in thru the Main Entrance and check in at the main information desk. ? ?Wear comfortable clothing and clothing appropriate for easy access to any Portacath or PICC line.  ? ?We strive to give you quality time with your provider. You may need to reschedule your appointment if you arrive late (15 or more minutes).  Arriving late affects you and other patients whose appointments are after yours.  Also, if you miss three or more appointments without notifying the office, you may be dismissed from the clinic at the provider?s discretion.    ?  ?For prescription refill requests, have your pharmacy contact our office and allow 72 hours for refills to be completed.   ? ?Today you received the following Phlebotomy, return as scheduled. ?  ?To help prevent nausea and vomiting after your treatment, we encourage you to take your nausea medication as directed. ? ?BELOW ARE SYMPTOMS THAT SHOULD BE REPORTED IMMEDIATELY: ?*FEVER GREATER THAN 100.4 F (38 ?C) OR HIGHER ?*CHILLS OR SWEATING ?*NAUSEA AND VOMITING THAT IS NOT CONTROLLED WITH YOUR NAUSEA MEDICATION ?*UNUSUAL SHORTNESS OF BREATH ?*UNUSUAL BRUISING OR BLEEDING ?*URINARY PROBLEMS (pain or burning when urinating, or frequent urination) ?*BOWEL PROBLEMS (unusual diarrhea, constipation, pain near the anus) ?TENDERNESS IN MOUTH AND THROAT WITH OR WITHOUT PRESENCE OF ULCERS (sore throat, sores in mouth, or a toothache) ?UNUSUAL RASH, SWELLING OR PAIN  ?UNUSUAL VAGINAL DISCHARGE OR ITCHING  ? ?Items with * indicate a potential emergency and should be followed up as soon as possible or go to the Emergency Department if any problems should occur. ? ?Please show the CHEMOTHERAPY ALERT CARD or IMMUNOTHERAPY ALERT CARD at check-in to the Emergency Department and triage  nurse. ? ?Should you have questions after your visit or need to cancel or reschedule your appointment, please contact  CANCER CENTER [phone]  and follow the prompts.  Office hours are 8:00 a.m. to 4:30 p.m. Monday - Friday. Please note that voicemails left after 4:00 p.m. may not be returned until the following business day.  We are closed weekends and major holidays. You have access to a nurse at all times for urgent questions. Please call the main number to the clinic [phone] and follow the prompts. ? ?For any non-urgent questions, you may also contact your provider using MyChart. We now offer e-Visits for anyone 18 and older to request care online for non-urgent symptoms. For details visit [email or web address]. ?  ?Also download the MyChart app! Go to the app store, search "MyChart", open the app, select , and log in with your MyChart username and password. ? ?Due to Covid, a mask is required upon entering the hospital/clinic. If you do not have a mask, one will be given to you upon arrival. For doctor visits, patients may have 1 support person aged 18 or older with them. For treatment visits, patients cannot have anyone with them due to current Covid guidelines and our immunocompromised population.  ?

## 2021-10-18 NOTE — Progress Notes (Signed)
Patient presents today for Phlebotomy. ?Phlebotomy started at 1503 and ended at 1515  ?270 cc removed, patient started to feel hot and clammy. Phlebotomy stopped. BP was 87/52 RN laid patient back into chair. BP at 1518 122/71, patient reports feeling better, patient sat up at drinking water. Patient monitored for 30 minutes then Patient discharged in satisfactory condition.  ?

## 2021-11-01 ENCOUNTER — Inpatient Hospital Stay (HOSPITAL_COMMUNITY): Payer: No Typology Code available for payment source

## 2021-11-01 DIAGNOSIS — R7989 Other specified abnormal findings of blood chemistry: Secondary | ICD-10-CM

## 2021-11-01 LAB — CBC
HCT: 40.7 % (ref 36.0–46.0)
Hemoglobin: 13 g/dL (ref 12.0–15.0)
MCH: 32 pg (ref 26.0–34.0)
MCHC: 31.9 g/dL (ref 30.0–36.0)
MCV: 100.2 fL — ABNORMAL HIGH (ref 80.0–100.0)
Platelets: 386 10*3/uL (ref 150–400)
RBC: 4.06 MIL/uL (ref 3.87–5.11)
RDW: 11.9 % (ref 11.5–15.5)
WBC: 7.1 10*3/uL (ref 4.0–10.5)
nRBC: 0 % (ref 0.0–0.2)

## 2021-11-01 NOTE — Patient Instructions (Signed)
[facility]  Discharge Instructions: Thank you for choosing [facility] to provide your oncology and hematology care.  If you have a lab appointment with the Cancer Center, please come in thru the Main Entrance and check in at the main information desk.  Wear comfortable clothing and clothing appropriate for easy access to any Portacath or PICC line.   We strive to give you quality time with your provider. You may need to reschedule your appointment if you arrive late (15 or more minutes).  Arriving late affects you and other patients whose appointments are after yours.  Also, if you miss three or more appointments without notifying the office, you may be dismissed from the clinic at the provider's discretion.      For prescription refill requests, have your pharmacy contact our office and allow 72 hours for refills to be completed.    Today you received the following chemotherapy and/or immunotherapy agents therapeutic  phlebotomy.   BELOW ARE SYMPTOMS THAT SHOULD BE REPORTED IMMEDIATELY: *FEVER GREATER THAN 100.4 F (38 C) OR HIGHER *CHILLS OR SWEATING *NAUSEA AND VOMITING THAT IS NOT CONTROLLED WITH YOUR NAUSEA MEDICATION *UNUSUAL SHORTNESS OF BREATH *UNUSUAL BRUISING OR BLEEDING *URINARY PROBLEMS (pain or burning when urinating, or frequent urination) *BOWEL PROBLEMS (unusual diarrhea, constipation, pain near the anus) TENDERNESS IN MOUTH AND THROAT WITH OR WITHOUT PRESENCE OF ULCERS (sore throat, sores in mouth, or a toothache) UNUSUAL RASH, SWELLING OR PAIN  UNUSUAL VAGINAL DISCHARGE OR ITCHING   Items with * indicate a potential emergency and should be followed up as soon as possible or go to the Emergency Department if any problems should occur.  Please show the CHEMOTHERAPY ALERT CARD or IMMUNOTHERAPY ALERT CARD at check-in to the Emergency Department and triage nurse.  Should you have questions after your visit or need to cancel or reschedule your  appointment, please contact [facility] [phone]  and follow the prompts.  Office hours are 8:00 a.m. to 4:30 p.m. Monday - Friday. Please note that voicemails left after 4:00 p.m. may not be returned until the following business day.  We are closed weekends and major holidays. You have access to a nurse at all times for urgent questions. Please call the main number to the clinic [phone] and follow the prompts.  For any non-urgent questions, you may also contact your provider using MyChart. We now offer e-Visits for anyone 21 and older to request care online for non-urgent symptoms. For details visit [email or web address].   Also download the MyChart app! Go to the app store, search "MyChart", open the app, select [place], and log in with your MyChart username and password.  Due to Covid, a mask is required upon entering the hospital/clinic. If you do not have a mask, one will be given to you upon arrival. For doctor visits, patients may have 1 support person aged 20 or older with them. For treatment visits, patients cannot have anyone with them due to current Covid guidelines and our immunocompromised population.

## 2021-11-01 NOTE — Progress Notes (Signed)
Stephanie Ramos presents today for theraputic phlebotomy per MD orders. Last hgb 13 /hct 40.7  was on 11/01/21 VSS prior to procedure.Pt was stuck twice in both AC's and they both clotted. R.Pennington made aware and stated we will try again at her next visit. Pt made aware and verbalized understanding.  Pt reports eating before arrival. Procedure started at 1457 using patients right AC. 120 mL of blood removed. Procedure ended at 1510. Gauze and coban applied to [facility], site clean and dry. VSS upon completion of procedure. Pt denies dizziness, lightheadedness, or feeling faint.  Discharged in satisfactory condition with follow up instruction

## 2021-11-15 ENCOUNTER — Encounter (HOSPITAL_COMMUNITY): Payer: Self-pay

## 2021-11-15 ENCOUNTER — Inpatient Hospital Stay (HOSPITAL_COMMUNITY): Payer: No Typology Code available for payment source | Attending: Hematology

## 2021-11-15 DIAGNOSIS — R7989 Other specified abnormal findings of blood chemistry: Secondary | ICD-10-CM | POA: Diagnosis not present

## 2021-11-15 NOTE — Progress Notes (Signed)
Stephanie Ramos presents for therapeutic phlebotomy per MD orders. Last HGB 13.0 / HCT 40.7 on 11-01-2021 . Vital signs stable prior to procedure. Procedure started at 14:44 pm and ended at 14:54 pm . 500 mls of blood removed. Patient denies any dizziness , lightheadedness, or feeling faint.  Gauze and coban applied to site. Vital signs stable at completion of procedure. Patient has no complaints at this time. Alert and oriented x 3. Discharged in stable condition.

## 2021-11-15 NOTE — Patient Instructions (Signed)
[facility]  Discharge Instructions: Thank you for choosing [facility] to provide your oncology and hematology care.  If you have a lab appointment with the Cancer Center, please come in thru the Main Entrance and check in at the main information desk.  Wear comfortable clothing and clothing appropriate for easy access to any Portacath or PICC line.   We strive to give you quality time with your provider. You may need to reschedule your appointment if you arrive late (15 or more minutes).  Arriving late affects you and other patients whose appointments are after yours.  Also, if you miss three or more appointments without notifying the office, you may be dismissed from the clinic at the provider's discretion.      For prescription refill requests, have your pharmacy contact our office and allow 72 hours for refills to be completed.    Today you received the following chemotherapy and/or immunotherapy agents Phlebotomy      To help prevent nausea and vomiting after your treatment, we encourage you to take your nausea medication as directed.  BELOW ARE SYMPTOMS THAT SHOULD BE REPORTED IMMEDIATELY: *FEVER GREATER THAN 100.4 F (38 C) OR HIGHER *CHILLS OR SWEATING *NAUSEA AND VOMITING THAT IS NOT CONTROLLED WITH YOUR NAUSEA MEDICATION *UNUSUAL SHORTNESS OF BREATH *UNUSUAL BRUISING OR BLEEDING *URINARY PROBLEMS (pain or burning when urinating, or frequent urination) *BOWEL PROBLEMS (unusual diarrhea, constipation, pain near the anus) TENDERNESS IN MOUTH AND THROAT WITH OR WITHOUT PRESENCE OF ULCERS (sore throat, sores in mouth, or a toothache) UNUSUAL RASH, SWELLING OR PAIN  UNUSUAL VAGINAL DISCHARGE OR ITCHING   Items with * indicate a potential emergency and should be followed up as soon as possible or go to the Emergency Department if any problems should occur.  Please show the CHEMOTHERAPY ALERT CARD or IMMUNOTHERAPY ALERT CARD at check-in to the Emergency  Department and triage nurse.  Should you have questions after your visit or need to cancel or reschedule your appointment, please contact [facility] [phone]  and follow the prompts.  Office hours are 8:00 a.m. to 4:30 p.m. Monday - Friday. Please note that voicemails left after 4:00 p.m. may not be returned until the following business day.  We are closed weekends and major holidays. You have access to a nurse at all times for urgent questions. Please call the main number to the clinic [phone] and follow the prompts.  For any non-urgent questions, you may also contact your provider using MyChart. We now offer e-Visits for anyone 18 and older to request care online for non-urgent symptoms. For details visit [email or web address].   Also download the MyChart app! Go to the app store, search "MyChart", open the app, select , and log in with your MyChart username and password.  Masks are optional in the cancer centers. If you would like for your care team to wear a mask while they are taking care of you, please let them know. For doctor visits, patients may have with them one support person who is at least 75 years old. At this time, visitors are not allowed in the infusion area.  

## 2021-11-28 ENCOUNTER — Inpatient Hospital Stay (HOSPITAL_COMMUNITY): Payer: No Typology Code available for payment source

## 2021-11-28 ENCOUNTER — Inpatient Hospital Stay (HOSPITAL_BASED_OUTPATIENT_CLINIC_OR_DEPARTMENT_OTHER): Payer: No Typology Code available for payment source | Admitting: Physician Assistant

## 2021-11-28 DIAGNOSIS — R7989 Other specified abnormal findings of blood chemistry: Secondary | ICD-10-CM

## 2021-11-28 LAB — CBC WITH DIFFERENTIAL/PLATELET
Abs Immature Granulocytes: 0.01 10*3/uL (ref 0.00–0.07)
Basophils Absolute: 0 10*3/uL (ref 0.0–0.1)
Basophils Relative: 1 %
Eosinophils Absolute: 0.1 10*3/uL (ref 0.0–0.5)
Eosinophils Relative: 1 %
HCT: 40.3 % (ref 36.0–46.0)
Hemoglobin: 12.5 g/dL (ref 12.0–15.0)
Immature Granulocytes: 0 %
Lymphocytes Relative: 30 %
Lymphs Abs: 1.8 10*3/uL (ref 0.7–4.0)
MCH: 30.6 pg (ref 26.0–34.0)
MCHC: 31 g/dL (ref 30.0–36.0)
MCV: 98.8 fL (ref 80.0–100.0)
Monocytes Absolute: 0.5 10*3/uL (ref 0.1–1.0)
Monocytes Relative: 8 %
Neutro Abs: 3.6 10*3/uL (ref 1.7–7.7)
Neutrophils Relative %: 60 %
Platelets: 434 10*3/uL — ABNORMAL HIGH (ref 150–400)
RBC: 4.08 MIL/uL (ref 3.87–5.11)
RDW: 11.9 % (ref 11.5–15.5)
WBC: 6 10*3/uL (ref 4.0–10.5)
nRBC: 0 % (ref 0.0–0.2)

## 2021-11-28 LAB — IRON AND TIBC
Iron: 37 ug/dL (ref 28–170)
Saturation Ratios: 10 % — ABNORMAL LOW (ref 10.4–31.8)
TIBC: 370 ug/dL (ref 250–450)
UIBC: 333 ug/dL

## 2021-11-28 LAB — FERRITIN: Ferritin: 319 ng/mL — ABNORMAL HIGH (ref 11–307)

## 2021-12-12 ENCOUNTER — Encounter (HOSPITAL_COMMUNITY): Payer: Self-pay

## 2021-12-12 ENCOUNTER — Inpatient Hospital Stay (HOSPITAL_COMMUNITY): Payer: No Typology Code available for payment source | Attending: Hematology

## 2021-12-12 DIAGNOSIS — R7989 Other specified abnormal findings of blood chemistry: Secondary | ICD-10-CM | POA: Insufficient documentation

## 2021-12-12 NOTE — Patient Instructions (Signed)
[facility]  Discharge Instructions: Thank you for choosing [facility] to provide your oncology and hematology care.  If you have a lab appointment with the Cancer Center, please come in thru the Main Entrance and check in at the main information desk.  Wear comfortable clothing and clothing appropriate for easy access to any Portacath or PICC line.   We strive to give you quality time with your provider. You may need to reschedule your appointment if you arrive late (15 or more minutes).  Arriving late affects you and other patients whose appointments are after yours.  Also, if you miss three or more appointments without notifying the office, you may be dismissed from the clinic at the provider's discretion.      For prescription refill requests, have your pharmacy contact our office and allow 72 hours for refills to be completed.         To help prevent nausea and vomiting after your treatment, we encourage you to take your nausea medication as directed.  BELOW ARE SYMPTOMS THAT SHOULD BE REPORTED IMMEDIATELY: *FEVER GREATER THAN 100.4 F (38 C) OR HIGHER *CHILLS OR SWEATING *NAUSEA AND VOMITING THAT IS NOT CONTROLLED WITH YOUR NAUSEA MEDICATION *UNUSUAL SHORTNESS OF BREATH *UNUSUAL BRUISING OR BLEEDING *URINARY PROBLEMS (pain or burning when urinating, or frequent urination) *BOWEL PROBLEMS (unusual diarrhea, constipation, pain near the anus) TENDERNESS IN MOUTH AND THROAT WITH OR WITHOUT PRESENCE OF ULCERS (sore throat, sores in mouth, or a toothache) UNUSUAL RASH, SWELLING OR PAIN  UNUSUAL VAGINAL DISCHARGE OR ITCHING   Items with * indicate a potential emergency and should be followed up as soon as possible or go to the Emergency Department if any problems should occur.  Please show the CHEMOTHERAPY ALERT CARD or IMMUNOTHERAPY ALERT CARD at check-in to the Emergency Department and triage nurse.  Should you have questions after your visit or need to  cancel or reschedule your appointment, please contact [facility] [phone]  and follow the prompts.  Office hours are 8:00 a.m. to 4:30 p.m. Monday - Friday. Please note that voicemails left after 4:00 p.m. may not be returned until the following business day.  We are closed weekends and major holidays. You have access to a nurse at all times for urgent questions. Please call the main number to the clinic [phone] and follow the prompts.  For any non-urgent questions, you may also contact your provider using MyChart. We now offer e-Visits for anyone 18 and older to request care online for non-urgent symptoms. For details visit [email or web address].   Also download the MyChart app! Go to the app store, search "MyChart", open the app, select [place], and log in with your MyChart username and password.  Masks are optional in the cancer centers. If you would like for your care team to wear a mask while they are taking care of you, please let them know. For doctor visits, patients may have with them one support person who is at least 75 years old. At this time, visitors are not allowed in the infusion area.  

## 2021-12-12 NOTE — Progress Notes (Signed)
Stephanie Ramos presents today for phlebotomy per MD orders. Phlebotomy procedure started at 1417 and ended at 1424. 500 cc removed. Patient tolerated procedure well. IV needle removed intact.  Patient tolerated phlebotomy with no complaints voiced.  Peripheral IV site intact with no bruising or swelling noted.  Denied SOB, chest pain, or dizziness.  Gauze with coban applied.  Discharged with VSS and no s/s of distress noted.

## 2021-12-21 ENCOUNTER — Other Ambulatory Visit: Payer: Self-pay | Admitting: Nurse Practitioner

## 2021-12-21 ENCOUNTER — Telehealth: Payer: Self-pay | Admitting: Nurse Practitioner

## 2021-12-21 DIAGNOSIS — E7841 Elevated Lipoprotein(a): Secondary | ICD-10-CM

## 2021-12-21 MED ORDER — SIMVASTATIN 40 MG PO TABS: 40.0000 mg | ORAL_TABLET | Freq: Every day | ORAL | 1 refills | Status: DC

## 2021-12-21 NOTE — Telephone Encounter (Signed)
No blood pressure medication listed for patient tried to call had to lvm

## 2021-12-21 NOTE — Telephone Encounter (Signed)
He said bp med but I do not see it on her list

## 2021-12-21 NOTE — Telephone Encounter (Signed)
Amiel with [facility] [phone], [phone], called stating they are needing to know if the  is still active on the pts medication list. If so can you please fax a new rx to the pharmacy.    [facility]    Fax # [phone] for [facility] for clarification

## 2021-12-26 ENCOUNTER — Inpatient Hospital Stay (HOSPITAL_COMMUNITY): Payer: No Typology Code available for payment source

## 2021-12-26 DIAGNOSIS — R7989 Other specified abnormal findings of blood chemistry: Secondary | ICD-10-CM

## 2021-12-26 LAB — CBC
HCT: 37.9 % (ref 36.0–46.0)
Hemoglobin: 11.7 g/dL — ABNORMAL LOW (ref 12.0–15.0)
MCH: 30.3 pg (ref 26.0–34.0)
MCHC: 30.9 g/dL (ref 30.0–36.0)
MCV: 98.2 fL (ref 80.0–100.0)
Platelets: 471 10*3/uL — ABNORMAL HIGH (ref 150–400)
RBC: 3.86 MIL/uL — ABNORMAL LOW (ref 3.87–5.11)
RDW: 12.9 % (ref 11.5–15.5)
WBC: 5.6 10*3/uL (ref 4.0–10.5)
nRBC: 0 % (ref 0.0–0.2)

## 2021-12-26 NOTE — Progress Notes (Signed)
Patient's Hemoglobin 11.7 & HCT 37.9 No phlebotomy needed today per parameters. Per Rojelio Brenner PA, patient will have labs rechecked in 1 month with possible phlebotomy.

## 2022-01-06 DIAGNOSIS — Z0100 Encounter for examination of eyes and vision without abnormal findings: Secondary | ICD-10-CM | POA: Diagnosis not present

## 2022-01-09 ENCOUNTER — Inpatient Hospital Stay (HOSPITAL_COMMUNITY): Payer: No Typology Code available for payment source

## 2022-01-23 ENCOUNTER — Inpatient Hospital Stay: Payer: No Typology Code available for payment source | Attending: Hematology

## 2022-01-23 ENCOUNTER — Inpatient Hospital Stay: Payer: No Typology Code available for payment source

## 2022-01-23 ENCOUNTER — Other Ambulatory Visit: Payer: Self-pay

## 2022-01-23 DIAGNOSIS — R7989 Other specified abnormal findings of blood chemistry: Secondary | ICD-10-CM

## 2022-01-23 DIAGNOSIS — R790 Abnormal level of blood mineral: Secondary | ICD-10-CM | POA: Diagnosis not present

## 2022-01-23 DIAGNOSIS — M85851 Other specified disorders of bone density and structure, right thigh: Secondary | ICD-10-CM | POA: Insufficient documentation

## 2022-01-23 LAB — COMPREHENSIVE METABOLIC PANEL
ALT: 29 U/L (ref 0–44)
AST: 25 U/L (ref 15–41)
Albumin: 4.3 g/dL (ref 3.5–5.0)
Alkaline Phosphatase: 68 U/L (ref 38–126)
Anion gap: 6 (ref 5–15)
BUN: 13 mg/dL (ref 8–23)
CO2: 29 mmol/L (ref 22–32)
Calcium: 9.4 mg/dL (ref 8.9–10.3)
Chloride: 106 mmol/L (ref 98–111)
Creatinine, Ser: 0.6 mg/dL (ref 0.44–1.00)
GFR, Estimated: >60 mL/min (ref >60)
Glucose, Bld: 97 mg/dL (ref 70–99)
Potassium: 4.1 mmol/L (ref 3.5–5.1)
Sodium: 141 mmol/L (ref 135–145)
Total Bilirubin: 0.3 mg/dL (ref 0.3–1.2)
Total Protein: 7.6 g/dL (ref 6.5–8.1)

## 2022-01-23 LAB — CBC WITH DIFFERENTIAL/PLATELET
Abs Immature Granulocytes: 0.02 10*3/uL (ref 0.00–0.07)
Basophils Absolute: 0 10*3/uL (ref 0.0–0.1)
Basophils Relative: 1 %
Eosinophils Absolute: 0.1 10*3/uL (ref 0.0–0.5)
Eosinophils Relative: 1 %
HCT: 44.4 % (ref 36.0–46.0)
Hemoglobin: 14.1 g/dL (ref 12.0–15.0)
Immature Granulocytes: 0 %
Lymphocytes Relative: 29 %
Lymphs Abs: 1.9 10*3/uL (ref 0.7–4.0)
MCH: 30.2 pg (ref 26.0–34.0)
MCHC: 31.8 g/dL (ref 30.0–36.0)
MCV: 95.1 fL (ref 80.0–100.0)
Monocytes Absolute: 0.5 10*3/uL (ref 0.1–1.0)
Monocytes Relative: 8 %
Neutro Abs: 3.9 10*3/uL (ref 1.7–7.7)
Neutrophils Relative %: 61 %
Platelets: 397 10*3/uL (ref 150–400)
RBC: 4.67 MIL/uL (ref 3.87–5.11)
RDW: 12.6 % (ref 11.5–15.5)
WBC: 6.4 10*3/uL (ref 4.0–10.5)
nRBC: 0 % (ref 0.0–0.2)

## 2022-01-23 LAB — VITAMIN D 25 HYDROXY (VIT D DEFICIENCY, FRACTURES): Vit D, 25-Hydroxy: 38.1 ng/mL (ref 30–100)

## 2022-01-23 NOTE — Progress Notes (Signed)
Patient presents today for phlebotomy per MD orders. Phlebotomy procedure started at 1435 and ended at 1444. 500 cc removed. Patient tolerated procedure well. Procedure tolerated well and without incident. Discharged ambulatory in stable condition.

## 2022-01-23 NOTE — Progress Notes (Signed)
14:55 pm. Patient laid supine and blood pressure re-checked. See flow sheet. 121/76. Patient states she feels better. Patient Asymptomatic. Sitting up in tx chair  eating crackers.

## 2022-01-23 NOTE — Patient Instructions (Signed)
[facility] [place]  Discharge Instructions: Thank you for choosing [facility] to provide your oncology and hematology care.  If you have a lab appointment with the Cancer Center, please come in thru the Main Entrance and check in at the main information desk.  Wear comfortable clothing and clothing appropriate for easy access to any Portacath or PICC line.   We strive to give you quality time with your provider. You may need to reschedule your appointment if you arrive late (15 or more minutes).  Arriving late affects you and other patients whose appointments are after yours.  Also, if you miss three or more appointments without notifying the office, you may be dismissed from the clinic at the provider's discretion.      For prescription refill requests, have your pharmacy contact our office and allow 72 hours for refills to be completed.    Today you received the following chemotherapy and/or immunotherapy agents Phlebotomy      To help prevent nausea and vomiting after your treatment, we encourage you to take your nausea medication as directed.  BELOW ARE SYMPTOMS THAT SHOULD BE REPORTED IMMEDIATELY: *FEVER GREATER THAN 100.4 F (38 C) OR HIGHER *CHILLS OR SWEATING *NAUSEA AND VOMITING THAT IS NOT CONTROLLED WITH YOUR NAUSEA MEDICATION *UNUSUAL SHORTNESS OF BREATH *UNUSUAL BRUISING OR BLEEDING *URINARY PROBLEMS (pain or burning when urinating, or frequent urination) *BOWEL PROBLEMS (unusual diarrhea, constipation, pain near the anus) TENDERNESS IN MOUTH AND THROAT WITH OR WITHOUT PRESENCE OF ULCERS (sore throat, sores in mouth, or a toothache) UNUSUAL RASH, SWELLING OR PAIN  UNUSUAL VAGINAL DISCHARGE OR ITCHING   Items with * indicate a potential emergency and should be followed up as soon as possible or go to the Emergency Department if any problems should occur.  Please show the CHEMOTHERAPY ALERT CARD or IMMUNOTHERAPY ALERT CARD at check-in to the  Emergency Department and triage nurse.  Should you have questions after your visit or need to cancel or reschedule your appointment, please contact [facility] [phone]  and follow the prompts.  Office hours are 8:00 a.m. to 4:30 p.m. Monday - Friday. Please note that voicemails left after 4:00 p.m. may not be returned until the following business day.  We are closed weekends and major holidays. You have access to a nurse at all times for urgent questions. Please call the main number to the clinic [phone] and follow the prompts.  For any non-urgent questions, you may also contact your provider using MyChart. We now offer e-Visits for anyone 18 and older to request care online for non-urgent symptoms. For details visit [email or web address].   Also download the MyChart app! Go to the app store, search "MyChart", open the app, select [place], and log in with your MyChart username and password.  Masks are optional in the cancer centers. If you would like for your care team to wear a mask while they are taking care of you, please let them know. You may have one support person who is at least 75 years old accompany you for your appointments.  

## 2022-02-07 ENCOUNTER — Inpatient Hospital Stay: Payer: No Typology Code available for payment source

## 2022-02-20 NOTE — Progress Notes (Unsigned)
[facility] [address] [place], [place] [address]   CLINIC:  Medical Oncology/Hematology  PCP:  Donell Beers, FNP [address] [place] [address] [phone]   REASON FOR VISIT:  Follow-up for iron overload  PRIOR THERAPY: None  CURRENT THERAPY: Phlebotomy every 2 weeks  INTERVAL HISTORY:  [name] 76 y.o. female returns for routine follow-up of her elevated ferritin and iron overload.  She was last seen by Judge Stall, PA-C on 11/28/2021.    At today's visit, she reports feeling fairly well.  She has received phlebotomy every 2 weeks since her last visit, which she is tolerating well.  She reports that she will have some mild fatigue for about a day after phlebotomy, but after that she recovers well.       She has not had any changes in her baseline health status since her last visit.  She denies hematochezia, hematuria, and black stools.    She denies joint pains and skin changes.    She denies any abdominal pain.   She denies any fatigue.   She does sometimes tire easily related to increased stress from taking care of her husband, who had a heart attack last year.    She has 75% energy and 100% appetite. She endorses that she is maintaining a stable weight.   REVIEW OF SYSTEMS:    Review of Systems  Constitutional:  Negative for appetite change, chills, fatigue, fever and unexpected weight change.  HENT:   Negative for lump/mass and nosebleeds.   Eyes:  Negative for eye problems.  Respiratory:  Negative for cough, hemoptysis and shortness of breath.   Cardiovascular:  Negative for chest pain, leg swelling and palpitations.  Gastrointestinal:  Negative for abdominal pain, blood in stool, constipation, diarrhea, nausea and vomiting.  Genitourinary:  Negative for hematuria.   Skin: Negative.   Neurological:  Negative for dizziness, headaches and light-headedness.  Hematological:  Does not bruise/bleed easily.       PAST MEDICAL/SURGICAL HISTORY:  Past Medical History:  Diagnosis Date   High iron content of liver determined by magnetic resonance imaging 08/02/2021   Hyperlipemia    Past Surgical History:  Procedure Laterality Date   BREAST SURGERY     CATARACT EXTRACTION W/PHACO Left 08/11/2019   Procedure: CATARACT EXTRACTION PHACO AND INTRAOCULAR LENS PLACEMENT (IOC) (CDE: 9.68);  Surgeon: [name], MD;  Location: [facility];  Service: Ophthalmology;  Laterality: Left;   CATARACT EXTRACTION W/PHACO Right 09/29/2019   Procedure: CATARACT EXTRACTION PHACO AND INTRAOCULAR LENS PLACEMENT RIGHT EYE;  Surgeon: [name], MD;  Location: [facility];  Service: Ophthalmology;  Laterality: Right;  CDE: 10.60   DILITATION & CURRETTAGE/HYSTROSCOPY WITH ESSURE       SOCIAL HISTORY:  Social History   Socioeconomic History   Marital status: Married    Spouse name: Not on file   Number of children: 1   Years of education: Not on file   Highest education level: Not on file  Occupational History   Not on file  Tobacco Use   Smoking status: Never   Smokeless tobacco: Never  Substance and Sexual Activity   Alcohol use: Never   Drug use: Never   Sexual activity: Not on file  Other Topics Concern   Not on file  Social History Narrative   Lives with husband of 49 years   They have one child and one grandchild      She enjoys reading romance novels  Diet: all food groups   Caffeine: Limited   Water: 1/2 gallon or more a day      Wears seat belt   Does not use phone while driving   Smoke detectors at home   Chartered certified accountantire extinguisher    Weapons safe box    Social DeterminantPsychologist, sport and exercises of Health   Financial Resource Strain: Low Risk  (10/03/2021)   Overall Financial Resource Strain (CARDIA)    Difficulty of Paying Living Expenses: Not hard at all  Food Insecurity: No Food Insecurity (10/03/2021)   Hunger Vital Sign    Worried About Running Out of Food in the Last Year: Never true    Ran Out of Food in the  Last Year: Never true  Transportation Needs: No Transportation Needs (10/03/2021)   PRAPARE - Administrator, Civil ServiceTransportation    Lack of Transportation (Medical): No    Lack of Transportation (Non-Medical): No  Physical Activity: Sufficiently Active (10/03/2021)   Exercise Vital Sign    Days of Exercise per Week: 7 days    Minutes of Exercise per Session: 40 min  Stress: No Stress Concern Present (10/03/2021)   [employer] - Occupational Stress Questionnaire    Feeling of Stress : Not at all  Social Connections: Moderately Integrated (10/03/2021)   Social Connection and Isolation Panel [NHANES]    Frequency of Communication with Friends and Family: More than three times a week    Frequency of Social Gatherings with Friends and Family: More than three times a week    Attends Religious Services: More than 4 times per year    Active Member of [employer] FinancialClubs or Organizations: No    Attends BankerClub or Organization Meetings: Never    Marital Status: Married  Catering managerntimate Partner Violence: Not At Risk (10/03/2021)   Humiliation, Afraid, Rape, and Kick questionnaire    Fear of Current or Ex-Partner: No    Emotionally Abused: No    Physically Abused: No    Sexually Abused: No    FAMILY HISTORY:  No family history on file.  CURRENT MEDICATIONS:  Outpatient Encounter Medications as of 02/21/2022  Medication Sig   Calcium Carb-Cholecalciferol 600-800 MG-UNIT TABS Take 1 tablet by mouth in the morning and at bedtime.   cyanocobalamin 1000 MCG tablet Take 1,000 mcg by mouth daily.   ezetimibe (ZETIA) 10 MG tablet Take 1 tablet (10 mg total) by mouth daily.   LOKELMA 5 g packet Take 1 packet by mouth once.   LORazepam (ATIVAN) 1 MG tablet Take 1 tablet (1 mg total) by mouth as directed. Take 30 minutes before your MRI   meclizine (ANTIVERT) 25 MG tablet Take 1 tablet (25 mg total) by mouth 3 (three) times daily as needed for dizziness.   omega-3 acid ethyl esters (LOVAZA) 1 g capsule Take by mouth 2 (two)  times daily.   simvastatin (ZOCOR) 40 MG tablet Take 1 tablet (40 mg total) by mouth daily.   traZODone (DESYREL) 50 MG tablet Take 0.5-1 tablets (25-50 mg total) by mouth at bedtime as needed for sleep.   Vitamin A 2400 MCG (8000 UT) TABS Take 8,000 Units by mouth daily.   vitamin C (ASCORBIC ACID) 500 MG tablet Take 500 mg by mouth every morning.   Vitamin E 400 units TABS Take 400 Units by mouth daily.   No facility-administered encounter medications on file as of 02/21/2022.    ALLERGIES:  Allergies  Allergen Reactions   Sulfa Antibiotics Rash     PHYSICAL EXAM:  ECOG PERFORMANCE STATUS: 0 - Asymptomatic  There were no vitals filed for this visit. There were no vitals filed for this visit. Physical Exam Constitutional:      Appearance: Normal appearance.  HENT:     Head: Normocephalic and atraumatic.     Mouth/Throat:     Mouth: Mucous membranes are moist.  Eyes:     Extraocular Movements: Extraocular movements intact.     Pupils: Pupils are equal, round, and reactive to light.  Cardiovascular:     Rate and Rhythm: Normal rate and regular rhythm.     Pulses: Normal pulses.     Heart sounds: Normal heart sounds.  Pulmonary:     Effort: Pulmonary effort is normal.     Breath sounds: Normal breath sounds.  Abdominal:     General: Bowel sounds are normal.     Palpations: Abdomen is soft.     Tenderness: There is no abdominal tenderness.  Musculoskeletal:        General: No swelling.     Right lower leg: No edema.     Left lower leg: No edema.  Lymphadenopathy:     Cervical: No cervical adenopathy.  Skin:    General: Skin is warm and dry.  Neurological:     General: No focal deficit present.     Mental Status: She is alert and oriented to person, place, and time.  Psychiatric:        Mood and Affect: Mood normal.        Behavior: Behavior normal.     LABORATORY DATA:  I have reviewed the labs as listed.  CBC    Component Value Date/Time   WBC 6.4  01/23/2022 1303   RBC 4.67 01/23/2022 1303   HGB 14.1 01/23/2022 1303   HGB 15.6 07/19/2021 1016   HCT 44.4 01/23/2022 1303   HCT 47.6 (H) 07/19/2021 1016   PLT 397 01/23/2022 1303   PLT 478 (H) 07/19/2021 1016   MCV 95.1 01/23/2022 1303   MCV 96 07/19/2021 1016   MCH 30.2 01/23/2022 1303   MCHC 31.8 01/23/2022 1303   RDW 12.6 01/23/2022 1303   RDW 13.3 07/19/2021 1016   LYMPHSABS 1.9 01/23/2022 1303   LYMPHSABS 1.8 07/19/2021 1016   MONOABS 0.5 01/23/2022 1303   EOSABS 0.1 01/23/2022 1303   EOSABS 0.1 07/19/2021 1016   BASOSABS 0.0 01/23/2022 1303   BASOSABS 0.1 07/19/2021 1016      Latest Ref Rng & Units 01/23/2022    1:07 PM 09/08/2021   11:15 AM 07/25/2021   10:12 AM  CMP  Glucose 70 - 99 mg/dL 97  518  96   BUN 8 - 23 mg/dL 13  16  12    Creatinine 0.44 - 1.00 mg/dL  8.41  6.60   Sodium 135 - 145 mmol/L 141  145  141   Potassium 3.5 - 5.1 mmol/L 4.1  4.4  5.1   Chloride 98 - 111 mmol/L 106  106  103   CO2 22 - 32 mmol/L 29  22  23    Calcium 8.9 - 10.3 mg/dL 9.4  9.7  6.30   Total Protein 6.5 - 8.1 g/dL 7.6     Total Bilirubin 0.3 - 1.2 mg/dL 0.3     Alkaline Phos 38 - 126 U/L 68     AST 15 - 41 U/L 25     ALT 0 - 44 U/L 29       DIAGNOSTIC IMAGING:  I have independently  reviewed the relevant imaging and discussed with the patient.  ASSESSMENT & PLAN:  Elevated ferritin with hepatic iron deposition - Patient was on iron tablet daily for many years, stopped in August 2022 when her ferritin was found to be elevated at 1609 with percent saturation 33. - She started taking woman's once a day vitamin which has iron in it.  She was told to stop taking this in December 2022. - She denies any prior history of transfusion or parenteral iron therapy. - No family history of hemochromatosis. - Hematology work-up (06/24/2021): Hemochromatosis DNA analysis was negative for mutations.  Hepatitis B & C panel was negative. - Abdominal ultrasound (07/01/2021) did not show any  signs of underlying liver disease that would explain elevated ferritin; normal parenchymal echogenicity - MRI liver (07/27/2021): Hepatic parenchymal signal inversion on in-and-out phase sequences is consistent with hepatic iron deposition; additionally, splenic and bone marrow signal loss suggest reticuloendothelial iron deposition; quantitative assessment of liver iron concentration not performed - She denies any chronic inflammatory state such as diabetes, autoimmune disease, or connective tissue disorder.   - Initial labs (05/16/2021): Ferritin 1875, iron saturation 30% -- Labs at last visit  (11/28/2021): Hgb 12.5, ferritin 319, iron saturation 10% --She has been receiving phlebotomy every 2 weeks  - Labs today (02/21/2022): Ferritin 164, iron saturation 8%, Hgb 12.6/MCV 93.0, platelets 472 - Cause of elevated ferritin is unclear, but differential diagnosis does include secondary hemochromatosis in the setting of prolonged iron supplementation.  She may also have rare mutation for hemochromatosis that is not part of the standard panel (non-HFE mutation). - PLAN: Due to signs of hepatic and reticuloendothelial iron deposition, we will proceed with phlebotomy until we reach goal ferritin < 100    -We will decrease frequency of phlebotomy to once a month since ferritin is nearly at goal and Hgb is slightly lower than her baseline, although she is not anemic. - Phlebotomy every month as tolerated with repeat labs and RTC in 3 months.      2.  Social/family history: - She lives at home with her husband.  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 - No family history of hemochromatosis or malignancies.   All questions were answered. The patient knows to call the clinic with any problems, questions or concerns.  Medical decision making: Low    Time spent on visit: I spent 20 minutes counseling the patient face to face. The total time spent in the appointment was 30 minutes  and more than 50% was on counseling.   Harriett Rush, PA-C  02/21/2022 1:26 PM

## 2022-02-21 ENCOUNTER — Inpatient Hospital Stay: Payer: No Typology Code available for payment source

## 2022-02-21 ENCOUNTER — Inpatient Hospital Stay (HOSPITAL_COMMUNITY): Payer: No Typology Code available for payment source | Attending: Hematology | Admitting: Physician Assistant

## 2022-02-21 DIAGNOSIS — R7989 Other specified abnormal findings of blood chemistry: Secondary | ICD-10-CM | POA: Diagnosis not present

## 2022-02-21 DIAGNOSIS — R5383 Other fatigue: Secondary | ICD-10-CM | POA: Insufficient documentation

## 2022-02-21 LAB — IRON AND TIBC
Iron: 30 ug/dL (ref 28–170)
Saturation Ratios: 8 % — ABNORMAL LOW (ref 10.4–31.8)
TIBC: 378 ug/dL (ref 250–450)
UIBC: 348 ug/dL

## 2022-02-21 LAB — CBC
HCT: 39.8 % (ref 36.0–46.0)
Hemoglobin: 12.6 g/dL (ref 12.0–15.0)
MCH: 29.4 pg (ref 26.0–34.0)
MCHC: 31.7 g/dL (ref 30.0–36.0)
MCV: 93 fL (ref 80.0–100.0)
Platelets: 472 10*3/uL — ABNORMAL HIGH (ref 150–400)
RBC: 4.28 MIL/uL (ref 3.87–5.11)
RDW: 13.2 % (ref 11.5–15.5)
WBC: 6.6 10*3/uL (ref 4.0–10.5)
nRBC: 0 % (ref 0.0–0.2)

## 2022-02-21 LAB — FERRITIN: Ferritin: 164 ng/mL (ref 11–307)

## 2022-02-21 NOTE — Progress Notes (Signed)
Stephanie Ramos presents today for phlebotomy per MD orders. Phlebotomy procedure started at 1345 and ended at 1350 500 cc removed. Patient tolerated procedure well. IV needle removed intact.

## 2022-02-21 NOTE — Patient Instructions (Signed)
[place] at [facility] Discharge Instructions  You were seen today by Tarri Abernethy PA-C for your iron overload (hemochromatosis).  Your ferritin level is IMPROVED!  We will continue phlebotomy (taking blood), but will decrease the frequency of this to once a month (instead of every other week).  This will help to decrease the amount of iron in your body over time.  LABS: Labs once per month  FOLLOW-UP APPOINTMENT: Office visit in about 3 months   Thank you for choosing [place] at [facility] to provide your oncology and hematology care.  To afford each patient quality time with our provider, please arrive at least 15 minutes before your scheduled appointment time.   If you have a lab appointment with the [facility] please come in thru the Main Entrance and check in at the main information desk.  You need to re-schedule your appointment should you arrive 10 or more minutes late.  We strive to give you quality time with our providers, and arriving late affects you and other patients whose appointments are after yours.  Also, if you no show three or more times for appointments you may be dismissed from the clinic at the providers discretion.     Again, thank you for choosing [facility].  Our hope is that these requests will decrease the amount of time that you wait before being seen by our physicians.       _____________________________________________________________  Should you have questions after your visit to [facility], please contact our office at [phone] and follow the prompts.  Our office hours are 8:00 a.m. and 4:30 p.m. Monday - Friday.  Please note that voicemails left after 4:00 p.m. may not be returned until the following business day.  We are closed weekends and major holidays.  You do have access to a nurse 24-7, just call the main number to the clinic [phone] and do not press any  options, hold on the line and a nurse will answer the phone.    For prescription refill requests, have your pharmacy contact our office and allow 72 hours.    Due to Covid, you will need to wear a mask upon entering the hospital. If you do not have a mask, a mask will be given to you at the Main Entrance upon arrival. For doctor visits, patients may have 1 support person age 74 or older with them. For treatment visits, patients can not have anyone with them due to social distancing guidelines and our immunocompromised population.

## 2022-02-21 NOTE — Patient Instructions (Signed)
[facility]  Discharge Instructions: Thank you for choosing [facility] to provide your oncology and hematology care.  If you have a lab appointment with the Cancer Center, please come in thru the Main Entrance and check in at the main information desk.  Wear comfortable clothing and clothing appropriate for easy access to any Portacath or PICC line.   We strive to give you quality time with your provider. You may need to reschedule your appointment if you arrive late (15 or more minutes).  Arriving late affects you and other patients whose appointments are after yours.  Also, if you miss three or more appointments without notifying the office, you may be dismissed from the clinic at the provider's discretion.      For prescription refill requests, have your pharmacy contact our office and allow 72 hours for refills to be completed.    Today you received the following therapeutic phlebotomy, return as scheduled.   To help prevent nausea and vomiting after your treatment, we encourage you to take your nausea medication as directed.  BELOW ARE SYMPTOMS THAT SHOULD BE REPORTED IMMEDIATELY: *FEVER GREATER THAN 100.4 F (38 C) OR HIGHER *CHILLS OR SWEATING *NAUSEA AND VOMITING THAT IS NOT CONTROLLED WITH YOUR NAUSEA MEDICATION *UNUSUAL SHORTNESS OF BREATH *UNUSUAL BRUISING OR BLEEDING *URINARY PROBLEMS (pain or burning when urinating, or frequent urination) *BOWEL PROBLEMS (unusual diarrhea, constipation, pain near the anus) TENDERNESS IN MOUTH AND THROAT WITH OR WITHOUT PRESENCE OF ULCERS (sore throat, sores in mouth, or a toothache) UNUSUAL RASH, SWELLING OR PAIN  UNUSUAL VAGINAL DISCHARGE OR ITCHING   Items with * indicate a potential emergency and should be followed up as soon as possible or go to the Emergency Department if any problems should occur.  Please show the CHEMOTHERAPY ALERT CARD or IMMUNOTHERAPY ALERT CARD at check-in to the Emergency  Department and triage nurse.  Should you have questions after your visit or need to cancel or reschedule your appointment, please contact [facility] [phone]  and follow the prompts.  Office hours are 8:00 a.m. to 4:30 p.m. Monday - Friday. Please note that voicemails left after 4:00 p.m. may not be returned until the following business day.  We are closed weekends and major holidays. You have access to a nurse at all times for urgent questions. Please call the main number to the clinic [phone] and follow the prompts.  For any non-urgent questions, you may also contact your provider using MyChart. We now offer e-Visits for anyone 18 and older to request care online for non-urgent symptoms. For details visit [email or web address].   Also download the MyChart app! Go to the app store, search "MyChart", open the app, select [place], and log in with your MyChart username and password.  Masks are optional in the cancer centers. If you would like for your care team to wear a mask while they are taking care of you, please let them know. You may have one support person who is at least 75 years old accompany you for your appointments.  

## 2022-03-10 ENCOUNTER — Encounter: Payer: Self-pay | Admitting: Internal Medicine

## 2022-03-10 ENCOUNTER — Ambulatory Visit: Payer: No Typology Code available for payment source | Admitting: Nurse Practitioner

## 2022-03-10 ENCOUNTER — Ambulatory Visit (INDEPENDENT_AMBULATORY_CARE_PROVIDER_SITE_OTHER): Payer: No Typology Code available for payment source | Admitting: Internal Medicine

## 2022-03-10 VITALS — BP 138/88 | HR 87 | Ht 61.0 in | Wt 133.8 lb

## 2022-03-10 DIAGNOSIS — E7841 Elevated Lipoprotein(a): Secondary | ICD-10-CM | POA: Diagnosis not present

## 2022-03-10 DIAGNOSIS — Z0001 Encounter for general adult medical examination with abnormal findings: Secondary | ICD-10-CM | POA: Diagnosis not present

## 2022-03-10 DIAGNOSIS — Z23 Encounter for immunization: Secondary | ICD-10-CM

## 2022-03-10 DIAGNOSIS — Z1212 Encounter for screening for malignant neoplasm of rectum: Secondary | ICD-10-CM | POA: Diagnosis not present

## 2022-03-10 DIAGNOSIS — R7989 Other specified abnormal findings of blood chemistry: Secondary | ICD-10-CM

## 2022-03-10 DIAGNOSIS — Z1211 Encounter for screening for malignant neoplasm of colon: Secondary | ICD-10-CM | POA: Diagnosis not present

## 2022-03-10 DIAGNOSIS — Z2821 Immunization not carried out because of patient refusal: Secondary | ICD-10-CM | POA: Diagnosis not present

## 2022-03-10 DIAGNOSIS — Z0000 Encounter for general adult medical examination without abnormal findings: Secondary | ICD-10-CM | POA: Insufficient documentation

## 2022-03-10 MED ORDER — SIMVASTATIN 40 MG PO TABS: 40.0000 mg | ORAL_TABLET | Freq: Every day | ORAL | 1 refills | Status: DC

## 2022-03-10 NOTE — Assessment & Plan Note (Signed)
Currently prescribed simvastatin, Zetia, and Lovaza.  Total cholesterol 162, LDL 81 on lipid panel from April. -No changes today.  Her 10-year ASCVD risk is 18.1%.  We will need to discuss increasing statin intensity at her next appointment.

## 2022-03-10 NOTE — Assessment & Plan Note (Signed)
Followed by hematology.  She has MRI evidence of high iron content in the liver.  Previous hemochromatosis work-up was negative for genetic cause.  She is currently undergoing phlebotomy.  Goal ferritin level < 100.  She states that she feels better since starting phlebotomy.

## 2022-03-10 NOTE — Progress Notes (Signed)
Established Patient Office Visit  Subjective   Patient ID: DARCUS EDDS, female    DOB: 1945/10/20  Age: 76 y.o. MRN: [id number]  Chief Complaint  Patient presents with   Follow-up   Stephanie Ramos returns to care today.  She is a 76 year old woman with a past medical history significant for hyperlipidemia and iron overload.  She was last seen at [facility] by Edwin Dada, NP in routine follow-up on 09/08/2021.  No changes were made to her medication regimen at that time.  In the interim she has been seen in follow-up and for phlebotomy by hematology.  Today Ms. Thome states that she feels well.  She is asymptomatic and has no acute concerns.  Chronic medical conditions and outstanding preventative healthcare maintenance items discussed today are individually addressed in A/P below.  Past Medical History:  Diagnosis Date   High iron content of liver determined by magnetic resonance imaging 08/02/2021   Hyperlipemia    Past Surgical History:  Procedure Laterality Date   BREAST SURGERY     CATARACT EXTRACTION W/PHACO Left 08/11/2019   Procedure: CATARACT EXTRACTION PHACO AND INTRAOCULAR LENS PLACEMENT (IOC) (CDE: 9.68);  Surgeon: [name], MD;  Location: [facility];  Service: Ophthalmology;  Laterality: Left;   CATARACT EXTRACTION W/PHACO Right 09/29/2019   Procedure: CATARACT EXTRACTION PHACO AND INTRAOCULAR LENS PLACEMENT RIGHT EYE;  Surgeon: [name], MD;  Location: [facility];  Service: Ophthalmology;  Laterality: Right;  CDE: 10.60   DILITATION & CURRETTAGE/HYSTROSCOPY WITH ESSURE     Social History   Tobacco Use   Smoking status: Never   Smokeless tobacco: Never  Substance Use Topics   Alcohol use: Never   Drug use: Never   History reviewed. No pertinent family history. Allergies  Allergen Reactions   Sulfa Antibiotics Rash   Review of Systems  Constitutional:  Negative for chills and fever.  HENT:  Negative for sore throat.   Respiratory:  Negative for cough and  shortness of breath.   Cardiovascular:  Negative for chest pain, palpitations and leg swelling.  Gastrointestinal:  Negative for abdominal pain, blood in stool, constipation, diarrhea, nausea and vomiting.  Genitourinary:  Negative for dysuria and hematuria.  Musculoskeletal:  Negative for myalgias.  Skin:  Negative for itching and rash.  Neurological:  Negative for dizziness and headaches.  Psychiatric/Behavioral:  Negative for depression and suicidal ideas.      Objective:     BP 138/88 (BP Location: Left Arm, Cuff Size: Normal)   Pulse 87   Ht 5\' 1"  (1.549 m)   Wt 133 lb 12.8 oz (60.7 kg)   SpO2 99%   BMI 25.28 kg/m  BP Readings from Last 3 Encounters:  03/10/22 138/88  02/21/22 118/66  02/21/22 126/68      Physical Exam Constitutional:      General: She is not in acute distress.    Appearance: Normal appearance. She is not toxic-appearing.  HENT:     Head: Normocephalic and atraumatic.     Right Ear: External ear normal.     Left Ear: External ear normal.     Nose: Nose normal. No congestion or rhinorrhea.     Mouth/Throat:     Mouth: Mucous membranes are moist.     Pharynx: Oropharynx is clear. No oropharyngeal exudate or posterior oropharyngeal erythema.  Eyes:     General: No scleral icterus.    Extraocular Movements: Extraocular movements intact.     Conjunctiva/sclera: Conjunctivae normal.     Pupils: Pupils  are equal, round, and reactive to light.  Cardiovascular:     Rate and Rhythm: Normal rate and regular rhythm.     Pulses: Normal pulses.     Heart sounds: Normal heart sounds.  Pulmonary:     Effort: Pulmonary effort is normal.     Breath sounds: Normal breath sounds. No wheezing, rhonchi or rales.  Abdominal:     General: Abdomen is flat. Bowel sounds are normal. There is no distension.     Palpations: Abdomen is soft.     Tenderness: There is no abdominal tenderness.  Musculoskeletal:        General: Normal range of motion.     Cervical back:  Normal range of motion.     Right lower leg: No edema.     Left lower leg: No edema.  Skin:    General: Skin is warm and dry.     Capillary Refill: Capillary refill takes less than 2 seconds.  Neurological:     General: No focal deficit present.     Mental Status: She is alert and oriented to person, place, and time.  Psychiatric:        Mood and Affect: Mood normal.        Behavior: Behavior normal.    Last CBC Lab Results  Component Value Date   WBC 6.6 02/21/2022   HGB 12.6 02/21/2022   HCT 39.8 02/21/2022   MCV 93.0 02/21/2022   MCH 29.4 02/21/2022   RDW 13.2 02/21/2022   PLT 472 (H) 99/37/1696   Last metabolic panel Lab Results  Component Value Date   GLUCOSE 97 01/23/2022   NA 141 01/23/2022   K 4.1 01/23/2022   CL 106 01/23/2022   CO2 29 01/23/2022   BUN 13 01/23/2022   CREATININE 0.60 01/23/2022   GFRNONAA >60 01/23/2022   CALCIUM 9.4 01/23/2022   PROT 7.6 01/23/2022   ALBUMIN 4.3 01/23/2022   LABGLOB 2.7 07/19/2021   AGRATIO 1.7 07/19/2021   BILITOT 0.3 01/23/2022   ALKPHOS 68 01/23/2022   AST 25 01/23/2022   ALT 29 01/23/2022   ANIONGAP 6 01/23/2022   Last lipids Lab Results  Component Value Date   CHOL 162 09/08/2021   HDL 66 09/08/2021   LDLCALC 81 09/08/2021   TRIG 79 09/08/2021   CHOLHDL 2.5 09/08/2021   Last hemoglobin A1c Lab Results  Component Value Date   HGBA1C 5.5 12/28/2017   Last thyroid functions Lab Results  Component Value Date   TSH 1.380 05/20/2021   Last vitamin D Lab Results  Component Value Date   VD25OH 38.10 01/23/2022   Last vitamin B12 and Folate Lab Results  Component Value Date   VITAMINB12 1,163 05/20/2021    The 10-year ASCVD risk score (Arnett DK, et al., 2019) is: 18.1%    Assessment & Plan:   Problem List Items Addressed This Visit       Hyperlipemia (Chronic)    Currently prescribed simvastatin, Zetia, and Lovaza.  Total cholesterol 162, LDL 81 on lipid panel from April. -No changes today.   Her 10-year ASCVD risk is 18.1%.  We will need to discuss increasing statin intensity at her next appointment.      Relevant Medications   simvastatin (ZOCOR) 40 MG tablet   Elevated ferritin level    Followed by hematology.  She has MRI evidence of high iron content in the liver.  Previous hemochromatosis work-up was negative for genetic cause.  She is currently undergoing phlebotomy.  Goal  ferritin level < 100.  She states that she feels better since starting phlebotomy.      Preventative health care    PCV 20 administered today Cologuard ordered today for colorectal cancer screening      Return in about 6 months (around 09/09/2022).    Billie Lade, MD

## 2022-03-10 NOTE — Patient Instructions (Signed)
It was a pleasure to see you today.  Thank you for giving [place] the opportunity to be involved in your care.  Below is a brief recap of your visit and next steps.  We will plan to see you again in 6 months.  Summary No medication changes today I have refilled simvastatin and ordered cologuard You will receive your pneumonia vaccine today  Next steps Follow up in 6 months

## 2022-03-10 NOTE — Assessment & Plan Note (Addendum)
PCV 20 administered today Cologuard ordered today for colorectal cancer screening

## 2022-03-15 NOTE — Addendum Note (Signed)
Addended by: Johny Drilling on: 03/15/2022 04:37 PM   Modules accepted: Orders

## 2022-03-21 DIAGNOSIS — Z1212 Encounter for screening for malignant neoplasm of rectum: Secondary | ICD-10-CM | POA: Diagnosis not present

## 2022-03-21 DIAGNOSIS — Z1211 Encounter for screening for malignant neoplasm of colon: Secondary | ICD-10-CM | POA: Diagnosis not present

## 2022-03-23 ENCOUNTER — Inpatient Hospital Stay: Payer: No Typology Code available for payment source

## 2022-03-23 ENCOUNTER — Inpatient Hospital Stay: Payer: No Typology Code available for payment source | Attending: Hematology

## 2022-03-23 VITALS — BP 115/68 | HR 84 | Temp 98.1°F | Resp 18

## 2022-03-23 DIAGNOSIS — R790 Abnormal level of blood mineral: Secondary | ICD-10-CM | POA: Diagnosis not present

## 2022-03-23 DIAGNOSIS — R7989 Other specified abnormal findings of blood chemistry: Secondary | ICD-10-CM

## 2022-03-23 LAB — CBC
HCT: 39.3 % (ref 36.0–46.0)
Hemoglobin: 12.2 g/dL (ref 12.0–15.0)
MCH: 28.9 pg (ref 26.0–34.0)
MCHC: 31 g/dL (ref 30.0–36.0)
MCV: 93.1 fL (ref 80.0–100.0)
Platelets: 443 10*3/uL — ABNORMAL HIGH (ref 150–400)
RBC: 4.22 MIL/uL (ref 3.87–5.11)
RDW: 13.6 % (ref 11.5–15.5)
WBC: 5.9 10*3/uL (ref 4.0–10.5)
nRBC: 0 % (ref 0.0–0.2)

## 2022-03-23 NOTE — Progress Notes (Signed)
Stephanie Ramos presents today for phlebotomy per MD orders. Phlebotomy procedure started at 1522 and ended at 1527. 500 cc removed. Patient tolerated procedure well. IV needle removed intact.

## 2022-03-31 LAB — COLOGUARD: COLOGUARD: NEGATIVE

## 2022-04-24 ENCOUNTER — Inpatient Hospital Stay: Payer: No Typology Code available for payment source

## 2022-04-24 ENCOUNTER — Inpatient Hospital Stay: Payer: No Typology Code available for payment source | Attending: Hematology

## 2022-04-24 VITALS — BP 127/80 | HR 80 | Temp 98.3°F | Resp 18

## 2022-04-24 DIAGNOSIS — R7989 Other specified abnormal findings of blood chemistry: Secondary | ICD-10-CM

## 2022-04-24 LAB — CBC
HCT: 40 % (ref 36.0–46.0)
Hemoglobin: 12.2 g/dL (ref 12.0–15.0)
MCH: 28.4 pg (ref 26.0–34.0)
MCHC: 30.5 g/dL (ref 30.0–36.0)
MCV: 93.2 fL (ref 80.0–100.0)
Platelets: 402 10*3/uL — ABNORMAL HIGH (ref 150–400)
RBC: 4.29 MIL/uL (ref 3.87–5.11)
RDW: 13.8 % (ref 11.5–15.5)
WBC: 6.2 10*3/uL (ref 4.0–10.5)
nRBC: 0 % (ref 0.0–0.2)

## 2022-04-24 NOTE — Progress Notes (Signed)
[name] presents today for phlebotomy per MD orders. Phlebotomy procedure started at 1441 and ended at 1446. 500 cc removed. Patient tolerated procedure well. IV needle removed intact.

## 2022-04-24 NOTE — Patient Instructions (Signed)
[facility]  Discharge Instructions: Thank you for choosing [facility] to provide your oncology and hematology care.  If you have a lab appointment with the Cancer Center, please come in thru the Main Entrance and check in at the main information desk.  Wear comfortable clothing and clothing appropriate for easy access to any Portacath or PICC line.   We strive to give you quality time with your provider. You may need to reschedule your appointment if you arrive late (15 or more minutes).  Arriving late affects you and other patients whose appointments are after yours.  Also, if you miss three or more appointments without notifying the office, you may be dismissed from the clinic at the provider's discretion.      For prescription refill requests, have your pharmacy contact our office and allow 72 hours for refills to be completed.    Today you received the following therapeutic phlebotomy, return as scheduled.   To help prevent nausea and vomiting after your treatment, we encourage you to take your nausea medication as directed.  BELOW ARE SYMPTOMS THAT SHOULD BE REPORTED IMMEDIATELY: *FEVER GREATER THAN 100.4 F (38 C) OR HIGHER *CHILLS OR SWEATING *NAUSEA AND VOMITING THAT IS NOT CONTROLLED WITH YOUR NAUSEA MEDICATION *UNUSUAL SHORTNESS OF BREATH *UNUSUAL BRUISING OR BLEEDING *URINARY PROBLEMS (pain or burning when urinating, or frequent urination) *BOWEL PROBLEMS (unusual diarrhea, constipation, pain near the anus) TENDERNESS IN MOUTH AND THROAT WITH OR WITHOUT PRESENCE OF ULCERS (sore throat, sores in mouth, or a toothache) UNUSUAL RASH, SWELLING OR PAIN  UNUSUAL VAGINAL DISCHARGE OR ITCHING   Items with * indicate a potential emergency and should be followed up as soon as possible or go to the Emergency Department if any problems should occur.  Please show the CHEMOTHERAPY ALERT CARD or IMMUNOTHERAPY ALERT CARD at check-in to the Emergency  Department and triage nurse.  Should you have questions after your visit or need to cancel or reschedule your appointment, please contact [facility] [phone]  and follow the prompts.  Office hours are 8:00 a.m. to 4:30 p.m. Monday - Friday. Please note that voicemails left after 4:00 p.m. may not be returned until the following business day.  We are closed weekends and major holidays. You have access to a nurse at all times for urgent questions. Please call the main number to the clinic [phone] and follow the prompts.  For any non-urgent questions, you may also contact your provider using MyChart. We now offer e-Visits for anyone 18 and older to request care online for non-urgent symptoms. For details visit [email or web address].   Also download the MyChart app! Go to the app store, search "MyChart", open the app, select [facility], and log in with your MyChart username and password.  Masks are optional in the cancer centers. If you would like for your care team to wear a mask while they are taking care of you, please let them know. You may have one support person who is at least 75 years old accompany you for your appointments.  

## 2022-05-23 NOTE — Progress Notes (Unsigned)
[facility] [address], [place] [address]   CLINIC:  Medical Oncology/Hematology  PCP:  Billie Lade, MD [address] [place] [address] [phone]   REASON FOR VISIT:  Follow-up for iron overload  PRIOR THERAPY: None  CURRENT THERAPY: Phlebotomy every 2 weeks  INTERVAL HISTORY:  Ms. Nolton 76 y.o. female returns for routine follow-up of her elevated ferritin and iron overload.  She was last seen by Judge Stall, PA-C on 02/21/2022.    At today's visit, she reports feeling fairly well.  She has received phlebotomy once a month since her last visit, which she is tolerating well.  She reports that she will have some mild fatigue for about a day after phlebotomy, but after that she recovers well.       She has not had any changes in her baseline health status since her last visit.  She denies hematochezia, hematuria, and black stools.   She denies joint pains and skin changes.    She denies any abdominal pain.   She denies any fatigue.   She does sometimes tire easily related to increased stress from taking care of her husband, who had a heart attack last year.    She has 75% energy and 100% appetite. She endorses that she is maintaining a stable weight.   REVIEW OF SYSTEMS:    Review of Systems  Constitutional:  Negative for appetite change, chills, fatigue, fever and unexpected weight change.  HENT:   Negative for lump/mass and nosebleeds.   Eyes:  Negative for eye problems.  Respiratory:  Negative for cough, hemoptysis and shortness of breath.   Cardiovascular:  Negative for chest pain, leg swelling and palpitations.  Gastrointestinal:  Negative for abdominal pain, blood in stool, constipation, diarrhea, nausea and vomiting.  Genitourinary:  Negative for hematuria.   Skin: Negative.   Neurological:  Negative for dizziness, headaches and light-headedness.  Hematological:  Does not bruise/bleed easily.      PAST  MEDICAL/SURGICAL HISTORY:  Past Medical History:  Diagnosis Date   High iron content of liver determined by magnetic resonance imaging 08/02/2021   Hyperlipemia    Past Surgical History:  Procedure Laterality Date   BREAST SURGERY     CATARACT EXTRACTION W/PHACO Left 08/11/2019   Procedure: CATARACT EXTRACTION PHACO AND INTRAOCULAR LENS PLACEMENT (IOC) (CDE: 9.68);  Surgeon: [name], MD;  Location: [facility];  Service: Ophthalmology;  Laterality: Left;   CATARACT EXTRACTION W/PHACO Right 09/29/2019   Procedure: CATARACT EXTRACTION PHACO AND INTRAOCULAR LENS PLACEMENT RIGHT EYE;  Surgeon: [name], MD;  Location: [facility];  Service: Ophthalmology;  Laterality: Right;  CDE: 10.60   DILITATION & CURRETTAGE/HYSTROSCOPY WITH ESSURE       SOCIAL HISTORY:  Social History   Socioeconomic History   Marital status: Married    Spouse name: Not on file   Number of children: 1   Years of education: Not on file   Highest education level: Not on file  Occupational History   Not on file  Tobacco Use   Smoking status: Never   Smokeless tobacco: Never  Substance and Sexual Activity   Alcohol use: Never   Drug use: Never   Sexual activity: Not on file  Other Topics Concern   Not on file  Social History Narrative   Lives with husband of 49 years   They have one child and one grandchild      She enjoys reading romance novels  Diet: all food groups   Caffeine: Limited   Water: 1/2 gallon or more a day      Wears seat belt   Does not use phone while driving   Psychologist, sport and exercise at home   Chartered certified accountant safe box    Social Determinants of Health   Financial Resource Strain: Low Risk  (10/03/2021)   Overall Financial Resource Strain (CARDIA)    Difficulty of Paying Living Expenses: Not hard at all  Food Insecurity: No Food Insecurity (10/03/2021)   Hunger Vital Sign    Worried About Running Out of Food in the Last Year: Never true    Ran Out of Food in the Last Year:  Never true  Transportation Needs: No Transportation Needs (10/03/2021)   PRAPARE - Administrator, Civil Service (Medical): No    Lack of Transportation (Non-Medical): No  Physical Activity: Sufficiently Active (10/03/2021)   Exercise Vital Sign    Days of Exercise per Week: 7 days    Minutes of Exercise per Session: 40 min  Stress: No Stress Concern Present (10/03/2021)   [employer] of Occupational Health - Occupational Stress Questionnaire    Feeling of Stress : Not at all  Social Connections: Moderately Integrated (10/03/2021)   Social Connection and Isolation Panel [NHANES]    Frequency of Communication with Friends and Family: More than three times a week    Frequency of Social Gatherings with Friends and Family: More than three times a week    Attends Religious Services: More than 4 times per year    Active Member of [employer] or Organizations: No    Attends Banker Meetings: Never    Marital Status: Married  Catering manager Violence: Not At Risk (10/03/2021)   Humiliation, Afraid, Rape, and Kick questionnaire    Fear of Current or Ex-Partner: No    Emotionally Abused: No    Physically Abused: No    Sexually Abused: No    FAMILY HISTORY:  No family history on file.  CURRENT MEDICATIONS:  Outpatient Encounter Medications as of 05/24/2022  Medication Sig   Calcium Carb-Cholecalciferol 600-800 MG-UNIT TABS Take 1 tablet by mouth in the morning and at bedtime.   cyanocobalamin 1000 MCG tablet Take 1,000 mcg by mouth daily.   ezetimibe (ZETIA) 10 MG tablet Take 1 tablet (10 mg total) by mouth daily.   meclizine (ANTIVERT) 25 MG tablet Take 1 tablet (25 mg total) by mouth 3 (three) times daily as needed for dizziness.   omega-3 acid ethyl esters (LOVAZA) 1 g capsule Take by mouth 2 (two) times daily.   simvastatin (ZOCOR) 40 MG tablet Take 1 tablet (40 mg total) by mouth daily.   Vitamin A 2400 MCG (8000 UT) TABS Take 8,000 Units by mouth daily.   vitamin C  (ASCORBIC ACID) 500 MG tablet Take 500 mg by mouth every morning.   Vitamin E 400 units TABS Take 400 Units by mouth daily.   No facility-administered encounter medications on file as of 05/24/2022.    ALLERGIES:  Allergies  Allergen Reactions   Sulfa Antibiotics Rash     PHYSICAL EXAM:    ECOG PERFORMANCE STATUS: 0 - Asymptomatic  There were no vitals filed for this visit. There were no vitals filed for this visit. Physical Exam Constitutional:      Appearance: Normal appearance.  HENT:     Head: Normocephalic and atraumatic.     Mouth/Throat:     Mouth: Mucous membranes  are moist.  Eyes:     Extraocular Movements: Extraocular movements intact.     Pupils: Pupils are equal, round, and reactive to light.  Cardiovascular:     Rate and Rhythm: Normal rate and regular rhythm.     Pulses: Normal pulses.     Heart sounds: Normal heart sounds.  Pulmonary:     Effort: Pulmonary effort is normal.     Breath sounds: Normal breath sounds.  Abdominal:     General: Bowel sounds are normal.     Palpations: Abdomen is soft.     Tenderness: There is no abdominal tenderness.  Musculoskeletal:        General: No swelling.     Right lower leg: No edema.     Left lower leg: No edema.  Lymphadenopathy:     Cervical: No cervical adenopathy.  Skin:    General: Skin is warm and dry.  Neurological:     General: No focal deficit present.     Mental Status: She is alert and oriented to person, place, and time.  Psychiatric:        Mood and Affect: Mood normal.        Behavior: Behavior normal.     LABORATORY DATA:  I have reviewed the labs as listed.  CBC    Component Value Date/Time   WBC 6.2 04/24/2022 1407   RBC 4.29 04/24/2022 1407   HGB 12.2 04/24/2022 1407   HGB 15.6 07/19/2021 1016   HCT 40.0 04/24/2022 1407   HCT 47.6 (H) 07/19/2021 1016   PLT 402 (H) 04/24/2022 1407   PLT 478 (H) 07/19/2021 1016   MCV 93.2 04/24/2022 1407   MCV 96 07/19/2021 1016   MCH 28.4  04/24/2022 1407   MCHC 30.5 04/24/2022 1407   RDW 13.8 04/24/2022 1407   RDW 13.3 07/19/2021 1016   LYMPHSABS 1.9 01/23/2022 1303   LYMPHSABS 1.8 07/19/2021 1016   MONOABS 0.5 01/23/2022 1303   EOSABS 0.1 01/23/2022 1303   EOSABS 0.1 07/19/2021 1016   BASOSABS 0.0 01/23/2022 1303   BASOSABS 0.1 07/19/2021 1016      Latest Ref Rng & Units 01/23/2022    1:07 PM 09/08/2021   11:15 AM 07/25/2021   10:12 AM  CMP  Glucose 70 - 99 mg/dL 97  841102  96   BUN 8 - 23 mg/dL 13  16  12    Creatinine 0.44 - 1.00 mg/dL 3.240.60  4.010.66  0.270.76   Sodium 135 - 145 mmol/L 141  145  141   Potassium 3.5 - 5.1 mmol/L 4.1  4.4  5.1   Chloride 98 - 111 mmol/L 106  106  103   CO2 22 - 32 mmol/L 29  22  23    Calcium 8.9 - 10.3 mg/dL 9.4  9.7  25.310.4   Total Protein 6.5 - 8.1 g/dL 7.6     Total Bilirubin 0.3 - 1.2 mg/dL 0.3     Alkaline Phos 38 - 126 U/L 68     AST 15 - 41 U/L 25     ALT 0 - 44 U/L 29       DIAGNOSTIC IMAGING:  I have independently reviewed the relevant imaging and discussed with the patient.  ASSESSMENT & PLAN:  Elevated ferritin with hepatic iron deposition - Patient was on iron tablet daily for many years, stopped in August 2022 when her ferritin was found to be elevated at 1609 with percent saturation 33. - She started taking woman's once a  day vitamin which has iron in it.  She was told to stop taking this in December 2022. - She denies any prior history of transfusion or parenteral iron therapy. - No family history of hemochromatosis. - Hematology work-up (06/24/2021): Hemochromatosis DNA analysis was negative for mutations.  Hepatitis B & C panel was negative. - Abdominal ultrasound (07/01/2021) did not show any signs of underlying liver disease that would explain elevated ferritin; normal parenchymal echogenicity - MRI liver (07/27/2021): Hepatic parenchymal signal inversion on in-and-out phase sequences is consistent with hepatic iron deposition; additionally, splenic and bone marrow signal  loss suggest reticuloendothelial iron deposition; quantitative assessment of liver iron concentration not performed - She denies any chronic inflammatory state such as diabetes, autoimmune disease, or connective tissue disorder.   - Initial labs (05/16/2021): Ferritin 1875, iron saturation 30% -- Labs at last visit  (11/28/2021): Ferritin 164, iron saturation 8%, Hgb 12.6/MCV 93.0, platelets 472 -- Due to signs of hepatic and reticuloendothelial iron deposition, she underwent phlebotomy therapy to reach goal of ferritin <100 - Labs today (05/24/2022): Hgb 12.3.  Platelets 423, likely reactive due to iatrogenically induced iron deficiency.  Ferritin at goal (88), with iron saturation 10%. - Cause of elevated ferritin is unclear, but differential diagnosis does include secondary hemochromatosis in the setting of prolonged iron supplementation.  She may also have rare mutation for hemochromatosis that is not part of the standard panel (non-HFE mutation). - PLAN: No indication for continued phlebotomy at this time.  We will recheck her CBC and iron panel in 3 months and consider reinitiating of phlebotomy if she has significant elevations in ferritin at that time.   2.  Social/family history: - She lives at home with her husband.  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 - No family history of hemochromatosis or malignancies.  PLAN SUMMARY: >> Same-day labs (CBC/D, ferritin, iron/TIBC) + office visit + possible phlebotomy in 3 months  All questions were answered. The patient knows to call the clinic with any problems, questions or concerns.  Medical decision making: Low  Time spent on visit: I spent 15 minutes counseling the patient face to face. The total time spent in the appointment was 22 minutes and more than 50% was on counseling.   Carnella Guadalajara, PA-C  05/24/2022 10:24 AM

## 2022-05-24 ENCOUNTER — Inpatient Hospital Stay: Payer: No Typology Code available for payment source | Attending: Hematology | Admitting: Physician Assistant

## 2022-05-24 ENCOUNTER — Inpatient Hospital Stay: Payer: No Typology Code available for payment source

## 2022-05-24 ENCOUNTER — Other Ambulatory Visit: Payer: Self-pay

## 2022-05-24 VITALS — BP 128/74 | HR 78 | Temp 97.9°F | Resp 16 | Wt 132.1 lb

## 2022-05-24 DIAGNOSIS — R5383 Other fatigue: Secondary | ICD-10-CM | POA: Diagnosis not present

## 2022-05-24 DIAGNOSIS — R7989 Other specified abnormal findings of blood chemistry: Secondary | ICD-10-CM

## 2022-05-24 DIAGNOSIS — Z79899 Other long term (current) drug therapy: Secondary | ICD-10-CM | POA: Diagnosis not present

## 2022-05-24 LAB — CBC WITH DIFFERENTIAL/PLATELET
Abs Immature Granulocytes: 0.01 10*3/uL (ref 0.00–0.07)
Basophils Absolute: 0 10*3/uL (ref 0.0–0.1)
Basophils Relative: 1 %
Eosinophils Absolute: 0.1 10*3/uL (ref 0.0–0.5)
Eosinophils Relative: 2 %
HCT: 41 % (ref 36.0–46.0)
Hemoglobin: 12.3 g/dL (ref 12.0–15.0)
Immature Granulocytes: 0 %
Lymphocytes Relative: 29 %
Lymphs Abs: 1.6 10*3/uL (ref 0.7–4.0)
MCH: 27.5 pg (ref 26.0–34.0)
MCHC: 30 g/dL (ref 30.0–36.0)
MCV: 91.7 fL (ref 80.0–100.0)
Monocytes Absolute: 0.4 10*3/uL (ref 0.1–1.0)
Monocytes Relative: 8 %
Neutro Abs: 3.4 10*3/uL (ref 1.7–7.7)
Neutrophils Relative %: 60 %
Platelets: 423 10*3/uL — ABNORMAL HIGH (ref 150–400)
RBC: 4.47 MIL/uL (ref 3.87–5.11)
RDW: 13.8 % (ref 11.5–15.5)
WBC: 5.5 10*3/uL (ref 4.0–10.5)
nRBC: 0 % (ref 0.0–0.2)

## 2022-05-24 LAB — IRON AND TIBC
Iron: 44 ug/dL (ref 28–170)
Saturation Ratios: 10 % — ABNORMAL LOW (ref 10.4–31.8)
TIBC: 434 ug/dL (ref 250–450)
UIBC: 390 ug/dL

## 2022-05-24 LAB — FERRITIN: Ferritin: 88 ng/mL (ref 11–307)

## 2022-05-24 NOTE — Patient Instructions (Signed)
[facility] at [facility] Discharge Instructions  You were seen today by Rojelio Brenner PA-C for your iron overload (hemochromatosis).  Your ferritin level is right where we want it.  You do not need any additional phlebotomy at this time.  We will check your labs and see you for an office visit again in 3 months to see if you need additional phlebotomy at that time.  FOLLOW-UP APPOINTMENT: Office visit in about 3 months   Thank you for choosing [facility] at [facility] to provide your oncology and hematology care.  To afford each patient quality time with our provider, please arrive at least 15 minutes before your scheduled appointment time.   If you have a lab appointment with the Cancer Center please come in thru the Main Entrance and check in at the main information desk.  You need to re-schedule your appointment should you arrive 10 or more minutes late.  We strive to give you quality time with our providers, and arriving late affects you and other patients whose appointments are after yours.  Also, if you no show three or more times for appointments you may be dismissed from the clinic at the providers discretion.     Again, thank you for choosing [facility].  Our hope is that these requests will decrease the amount of time that you wait before being seen by our physicians.       _____________________________________________________________  Should you have questions after your visit to [facility], please contact our office at [phone] and follow the prompts.  Our office hours are 8:00 a.m. and 4:30 p.m. Monday - Friday.  Please note that voicemails left after 4:00 p.m. may not be returned until the following business day.  We are closed weekends and major holidays.  You do have access to a nurse 24-7, just call the main number to the clinic [phone] and do not press any options, hold on the line and a  nurse will answer the phone.    For prescription refill requests, have your pharmacy contact our office and allow 72 hours.    Due to Covid, you will need to wear a mask upon entering the hospital. If you do not have a mask, a mask will be given to you at the Main Entrance upon arrival. For doctor visits, patients may have 1 support person age 66 or older with them. For treatment visits, patients can not have anyone with them due to social distancing guidelines and our immunocompromised population.

## 2022-05-25 ENCOUNTER — Inpatient Hospital Stay: Payer: No Typology Code available for payment source

## 2022-06-12 ENCOUNTER — Other Ambulatory Visit: Payer: Self-pay | Admitting: Nurse Practitioner

## 2022-06-12 DIAGNOSIS — E7841 Elevated Lipoprotein(a): Secondary | ICD-10-CM

## 2022-08-21 NOTE — Progress Notes (Signed)
[facility] [address] [place] [address]    Clinic Day:  08/22/2022  Referring physician: Johnette Abraham, MD  Patient Care Team: Johnette Abraham, MD as PCP - General (Internal Medicine) Derek Jack, MD as Medical Oncologist (Hematology)   ASSESSMENT & PLAN:   Assessment: 1.  Elevated ferritin levels: - Patient was on iron tablet daily for many years, stopped in August 2022 when her ferritin was found to be elevated at 1609 with percent saturation 33. - She started taking woman's once a day vitamin which has iron in it. - Recent ferritin on 05/16/2021 was 1875.  He was told to stop taking woman's vitamin. - She denies any prior history of transfusion or parenteral iron therapy. - Hemochromatosis workup: Negative - MRI liver (07/27/2021): Hepatic iron deposition. - She was on intermittent phlebotomies, last phlebotomy on 04/24/2022.  2.  Social/family history: -She lives at home with her husband.  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 - No family history of hemochromatosis or malignancies.  Plan: 1.  Elevated ferritin levels: - Her last phlebotomy was on 04/24/2022. - We reviewed labs today which showed ferritin 85 and percent saturation 10.  Hemoglobin is 13.6 and rest of CBC was normal. - No indication for phlebotomy at this time.  I have counseled her not to take vitamin C or iron supplements and avoid cooking in cast and pots and pans. - RTC 4 months with repeat labs including ferritin and iron panel, LFTs and CBC.  Orders Placed This Encounter  Procedures   Ferritin    Standing Status:   Future    Standing Expiration Date:   08/22/2023    Order Specific Question:   Release to patient    Answer:   Immediate   Iron and TIBC    Standing Status:   Future    Standing Expiration Date:   08/22/2023    Order Specific Question:   Release to patient    Answer:   Immediate   Hepatic function panel    Standing  Status:   Future    Standing Expiration Date:   08/22/2023   CBC    Standing Status:   Future    Standing Expiration Date:   08/22/2023      I,Alexis Herring,acting as a scribe for Derek Jack, MD.,have documented all relevant documentation on the behalf of Derek Jack, MD,as directed by  Derek Jack, MD while in the presence of Derek Jack, MD.   I, Derek Jack MD, have reviewed the above documentation for accuracy and completeness, and I agree with the above.   Derek Jack, MD   3/19/20244:14 PM  CHIEF COMPLAINT:   Diagnosis: elevated ferritin   Prior Therapy: Intermittent phlebotomy  Current Therapy: Observation   INTERVAL HISTORY:   Stephanie Ramos is a 77 y.o. female presenting to clinic today for follow up of elevated ferritin. She was last seen by me on 06/24/21. She was most recently seen by PA Pennington on 05/24/22.  Today, she states that she is doing well overall. Her appetite level is at 100%. Her energy level is at 80%. She denies any fevers, night sweats or weight loss.  No infections recently.   PAST MEDICAL HISTORY:   Past Medical History: Past Medical History:  Diagnosis Date   High iron content of liver determined by magnetic resonance imaging 08/02/2021   Hyperlipemia     Surgical History: Past Surgical History:  Procedure Laterality Date  BREAST SURGERY     CATARACT EXTRACTION W/PHACO Left 08/11/2019   Procedure: CATARACT EXTRACTION PHACO AND INTRAOCULAR LENS PLACEMENT (IOC) (CDE: 9.68);  Surgeon: Baruch Goldmann, MD;  Location: [facility];  Service: Ophthalmology;  Laterality: Left;   CATARACT EXTRACTION W/PHACO Right 09/29/2019   Procedure: CATARACT EXTRACTION PHACO AND INTRAOCULAR LENS PLACEMENT RIGHT EYE;  Surgeon: Baruch Goldmann, MD;  Location: [facility];  Service: Ophthalmology;  Laterality: Right;  CDE: 10.60   DILITATION & CURRETTAGE/HYSTROSCOPY WITH ESSURE      Social History: Social History    Socioeconomic History   Marital status: Married    Spouse name: Not on file   Number of children: 1   Years of education: Not on file   Highest education level: Not on file  Occupational History   Not on file  Tobacco Use   Smoking status: Never   Smokeless tobacco: Never  Substance and Sexual Activity   Alcohol use: Never   Drug use: Never   Sexual activity: Not on file  Other Topics Concern   Not on file  Social History Narrative   Lives with husband of 38 years   They have one child and one grandchild      She enjoys reading romance novels      Diet: all food groups   Caffeine: Limited   Water: 1/2 gallon or more a day      Wears seat belt   Does not use phone while driving   Oceanographer at home   Designer, fashion/clothing safe box    Social Determinants of Health   Financial Resource Strain: [place]  (10/03/2021)   Overall Financial Resource Strain (CARDIA)    Difficulty of Paying Living Expenses: Not hard at all  Food Insecurity: No [place] (10/03/2021)   Hunger Vital Sign    Worried About Running Out of Food in the Last Year: Never true    [place] in the Last Year: Never true  Transportation Needs: No Transportation Needs (10/03/2021)   PRAPARE - Hydrologist (Medical): No    Lack of Transportation (Non-Medical): No  Physical Activity: Sufficiently Active (10/03/2021)   Exercise Vital Sign    Days of Exercise per Week: 7 days    Minutes of Exercise per Session: 40 min  Stress: No Stress Concern Present (10/03/2021)   [place]    Feeling of Stress : Not at all  Social Connections: Moderately Integrated (10/03/2021)   Social Connection and Isolation Panel [NHANES]    Frequency of Communication with Friends and Family: More than three times a week    Frequency of Social Gatherings with Friends and Family: More than three times a week    Attends  Religious Services: More than 4 times per year    Active Member of [employer] or Organizations: No    Attends Archivist Meetings: Never    Marital Status: Married  Human resources officer Violence: Not At Risk (10/03/2021)   Humiliation, Afraid, Rape, and Kick questionnaire    Fear of Current or Ex-Partner: No    Emotionally Abused: No    Physically Abused: No    Sexually Abused: No    Family History: No family history on file.  Current Medications:  Current Outpatient Medications:    Calcium Carb-Cholecalciferol 600-800 MG-UNIT TABS, Take 1 tablet by mouth in the morning and at bedtime., Disp: , Rfl:  cyanocobalamin 1000 MCG tablet, Take 1,000 mcg by mouth daily., Disp: , Rfl:    ezetimibe (ZETIA) 10 MG tablet, Take 1 tablet by mouth once daily, Disp: 90 tablet, Rfl: 0   meclizine (ANTIVERT) 25 MG tablet, Take 1 tablet (25 mg total) by mouth 3 (three) times daily as needed for dizziness., Disp: 30 tablet, Rfl: 2   omega-3 acid ethyl esters (LOVAZA) 1 g capsule, Take by mouth 2 (two) times daily., Disp: , Rfl:    simvastatin (ZOCOR) 40 MG tablet, Take 1 tablet (40 mg total) by mouth daily., Disp: 90 tablet, Rfl: 1   Vitamin A 2400 MCG (8000 UT) TABS, Take 8,000 Units by mouth daily., Disp: , Rfl:    vitamin C (ASCORBIC ACID) 500 MG tablet, Take 500 mg by mouth every morning., Disp: , Rfl:    Vitamin E 400 units TABS, Take 400 Units by mouth daily., Disp: , Rfl:    Allergies: Allergies  Allergen Reactions   Sulfa Antibiotics Rash    REVIEW OF SYSTEMS:   Review of Systems  Constitutional:  Negative for chills, fatigue and fever.  HENT:   Negative for lump/mass, mouth sores, nosebleeds, sore throat and trouble swallowing.   Eyes:  Negative for eye problems.  Respiratory:  Negative for cough and shortness of breath.   Cardiovascular:  Negative for chest pain, leg swelling and palpitations.  Gastrointestinal:  Negative for abdominal pain, constipation, diarrhea, nausea and  vomiting.  Genitourinary:  Negative for bladder incontinence, difficulty urinating, dysuria, frequency, hematuria and nocturia.   Musculoskeletal:  Negative for arthralgias, back pain, flank pain, myalgias and neck pain.  Skin:  Negative for itching and rash.  Neurological:  Negative for dizziness, headaches and numbness.  Hematological:  Does not bruise/bleed easily.  Psychiatric/Behavioral:  Negative for depression, sleep disturbance and suicidal ideas. The patient is not nervous/anxious.   All other systems reviewed and are negative.    VITALS:   Blood pressure 135/79, pulse 80, temperature 99.4 F (37.4 C), temperature source Tympanic, resp. rate 18, height 5\' 1"  (1.549 m), weight 140 lb 11.2 oz (63.8 kg), SpO2 100 %.  Wt Readings from Last 3 Encounters:  08/22/22 140 lb 11.2 oz (63.8 kg)  05/24/22 132 lb 0.9 oz (59.9 kg)  03/10/22 133 lb 12.8 oz (60.7 kg)    Body mass index is 26.59 kg/m.  Performance status (ECOG): 1 - Symptomatic but completely ambulatory  PHYSICAL EXAM:   Physical Exam Vitals and nursing note reviewed. Exam conducted with a chaperone present.  Constitutional:      Appearance: Normal appearance.  Cardiovascular:     Rate and Rhythm: Normal rate and regular rhythm.     Pulses: Normal pulses.     Heart sounds: Normal heart sounds.  Pulmonary:     Effort: Pulmonary effort is normal.     Breath sounds: Normal breath sounds.  Abdominal:     Palpations: Abdomen is soft. There is no hepatomegaly, splenomegaly or mass.     Tenderness: There is no abdominal tenderness.  Musculoskeletal:     Right lower leg: No edema.     Left lower leg: No edema.  Lymphadenopathy:     Cervical: No cervical adenopathy.     Right cervical: No superficial, deep or posterior cervical adenopathy.    Left cervical: No superficial, deep or posterior cervical adenopathy.     Upper Body:     Right upper body: No supraclavicular or axillary adenopathy.     Left upper body: No  supraclavicular or axillary adenopathy.  Neurological:     General: No focal deficit present.     Mental Status: She is alert and oriented to person, place, and time.  Psychiatric:        Mood and Affect: Mood normal.        Behavior: Behavior normal.     LABS:      Latest Ref Rng & Units 08/22/2022    1:01 PM 05/24/2022    8:43 AM 04/24/2022    2:07 PM  CBC  WBC 4.0 - 10.5 K/uL 7.0  5.5  6.2   Hemoglobin 12.0 - 15.0 g/dL 13.6  12.3  12.2   Hematocrit 36.0 - 46.0 % 43.7  41.0  40.0   Platelets 150 - 400 K/uL 376  423  402       Latest Ref Rng & Units 01/23/2022    1:07 PM 09/08/2021   11:15 AM 07/25/2021   10:12 AM  CMP  Glucose 70 - 99 mg/dL 97  102  96   BUN 8 - 23 mg/dL 13  16  12    Creatinine 0.44 - 1.00 mg/dL 0.60  0.66  0.76   Sodium 135 - 145 mmol/L 141  145  141   Potassium 3.5 - 5.1 mmol/L 4.1  4.4  5.1   Chloride 98 - 111 mmol/L 106  106  103   CO2 22 - 32 mmol/L 29  22  23    Calcium 8.9 - 10.3 mg/dL 9.4  9.7  10.4   Total Protein 6.5 - 8.1 g/dL 7.6     Total Bilirubin 0.3 - 1.2 mg/dL 0.3     Alkaline Phos 38 - 126 U/L 68     AST 15 - 41 U/L 25     ALT 0 - 44 U/L 29        No results found for: "CEA1", "CEA" / No results found for: "CEA1", "CEA" No results found for: "PSA1" No results found for: "[id number]" No results found for: "CAN125"  No results found for: "TOTALPROTELP", "ALBUMINELP", "A1GS", "A2GS", "BETS", "BETA2SER", "GAMS", "MSPIKE", "SPEI" Lab Results  Component Value Date   TIBC 408 08/22/2022   TIBC 434 05/24/2022   TIBC 378 02/21/2022   FERRITIN 85 08/22/2022   FERRITIN 88 05/24/2022   FERRITIN 164 02/21/2022   IRONPCTSAT 10 (L) 08/22/2022   IRONPCTSAT 10 (L) 05/24/2022   IRONPCTSAT 8 (L) 02/21/2022   Lab Results  Component Value Date   LDH 174 05/20/2021     STUDIES:   No results found.

## 2022-08-22 ENCOUNTER — Inpatient Hospital Stay: Payer: No Typology Code available for payment source

## 2022-08-22 ENCOUNTER — Inpatient Hospital Stay: Payer: No Typology Code available for payment source | Attending: Hematology | Admitting: Hematology

## 2022-08-22 VITALS — BP 135/79 | HR 80 | Temp 99.4°F | Resp 18 | Ht 61.0 in | Wt 140.7 lb

## 2022-08-22 DIAGNOSIS — R7989 Other specified abnormal findings of blood chemistry: Secondary | ICD-10-CM

## 2022-08-22 LAB — CBC WITH DIFFERENTIAL/PLATELET
Abs Immature Granulocytes: 0.02 10*3/uL (ref 0.00–0.07)
Basophils Absolute: 0.1 10*3/uL (ref 0.0–0.1)
Basophils Relative: 1 %
Eosinophils Absolute: 0.1 10*3/uL (ref 0.0–0.5)
Eosinophils Relative: 1 %
HCT: 43.7 % (ref 36.0–46.0)
Hemoglobin: 13.6 g/dL (ref 12.0–15.0)
Immature Granulocytes: 0 %
Lymphocytes Relative: 30 %
Lymphs Abs: 2.1 10*3/uL (ref 0.7–4.0)
MCH: 28.6 pg (ref 26.0–34.0)
MCHC: 31.1 g/dL (ref 30.0–36.0)
MCV: 92 fL (ref 80.0–100.0)
Monocytes Absolute: 0.5 10*3/uL (ref 0.1–1.0)
Monocytes Relative: 7 %
Neutro Abs: 4.3 10*3/uL (ref 1.7–7.7)
Neutrophils Relative %: 61 %
Platelets: 376 10*3/uL (ref 150–400)
RBC: 4.75 MIL/uL (ref 3.87–5.11)
RDW: 14.6 % (ref 11.5–15.5)
WBC: 7 10*3/uL (ref 4.0–10.5)
nRBC: 0 % (ref 0.0–0.2)

## 2022-08-22 LAB — IRON AND TIBC
Iron: 39 ug/dL (ref 28–170)
Saturation Ratios: 10 % — ABNORMAL LOW (ref 10.4–31.8)
TIBC: 408 ug/dL (ref 250–450)
UIBC: 369 ug/dL

## 2022-08-22 LAB — FERRITIN: Ferritin: 85 ng/mL (ref 11–307)

## 2022-08-22 NOTE — Patient Instructions (Signed)
Stephanie Ramos  Discharge Instructions  You were seen and examined today by Dr. Delton Coombes.  Dr. Delton Coombes discussed your most recent lab work which revealed that everything looks good.  Stop taking vitamin C as it increases iron absorption.   Follow-up as scheduled in 4 months with labs.    Thank you for choosing [facility] to provide your oncology and hematology care.   To afford each patient quality time with our provider, please arrive at least 15 minutes before your scheduled appointment time. You may need to reschedule your appointment if you arrive late (10 or more minutes). Arriving late affects you and other patients whose appointments are after yours.  Also, if you miss three or more appointments without notifying the office, you may be dismissed from the clinic at the provider's discretion.    Again, thank you for choosing [facility].  Our hope is that these requests will decrease the amount of time that you wait before being seen by our physicians.   If you have a lab appointment with the [facility] please come in thru the Main Entrance and check in at the main information desk.           _____________________________________________________________  Should you have questions after your visit to [facility], please contact our office at [phone] and follow the prompts.  Our office hours are 8:00 a.m. to 4:30 p.m. Monday - Thursday and 8:00 a.m. to 2:30 p.m. Friday.  Please note that voicemails left after 4:00 p.m. may not be returned until the following business day.  We are closed weekends and all major holidays.  You do have access to a nurse 24-7, just call the main number to the clinic [phone] and do not press any options, hold on the line and a nurse will answer the phone.    For prescription refill requests, have your pharmacy contact our office and allow 72 hours.    Masks are  optional in the cancer centers. If you would like for your care team to wear a mask while they are taking care of you, please let them know. You may have one support person who is at least 76 years old accompany you for your appointments.

## 2022-08-22 NOTE — Progress Notes (Signed)
Hold phlebotomy today per Dr Caralyn Guile 85 and Hgb 13.6. Patient left in stable condition.

## 2022-09-05 ENCOUNTER — Other Ambulatory Visit: Payer: Self-pay

## 2022-09-05 ENCOUNTER — Telehealth: Payer: Self-pay | Admitting: Internal Medicine

## 2022-09-05 DIAGNOSIS — E7841 Elevated Lipoprotein(a): Secondary | ICD-10-CM

## 2022-09-05 MED ORDER — SIMVASTATIN 40 MG PO TABS: 40.0000 mg | ORAL_TABLET | Freq: Every day | ORAL | 1 refills | Status: DC

## 2022-09-05 NOTE — Telephone Encounter (Signed)
Eli called from [facility] medication need med refill  simvastatin (ZOCOR) 40 MG tablet [id number]  Pharmacy: [facility]

## 2022-09-05 NOTE — Telephone Encounter (Signed)
Refills sent

## 2022-09-08 ENCOUNTER — Ambulatory Visit: Payer: No Typology Code available for payment source | Admitting: Internal Medicine

## 2022-09-26 ENCOUNTER — Encounter: Payer: Self-pay | Admitting: Internal Medicine

## 2022-09-26 ENCOUNTER — Ambulatory Visit (INDEPENDENT_AMBULATORY_CARE_PROVIDER_SITE_OTHER): Payer: No Typology Code available for payment source | Admitting: Internal Medicine

## 2022-09-26 VITALS — BP 126/68 | HR 95 | Ht 61.0 in | Wt 142.4 lb

## 2022-09-26 DIAGNOSIS — R7989 Other specified abnormal findings of blood chemistry: Secondary | ICD-10-CM

## 2022-09-26 DIAGNOSIS — E7841 Elevated Lipoprotein(a): Secondary | ICD-10-CM | POA: Diagnosis not present

## 2022-09-26 DIAGNOSIS — Z1329 Encounter for screening for other suspected endocrine disorder: Secondary | ICD-10-CM

## 2022-09-26 DIAGNOSIS — R42 Dizziness and giddiness: Secondary | ICD-10-CM

## 2022-09-26 MED ORDER — MECLIZINE HCL 25 MG PO TABS
25.0000 mg | ORAL_TABLET | Freq: Three times a day (TID) | ORAL | 2 refills | Status: DC | PRN
Start: 2022-09-26 — End: 2023-10-26

## 2022-09-26 NOTE — Assessment & Plan Note (Signed)
Followed by hematology.  Has recently undergone phlebotomy to achieve goal ferritin level < 100.  Ferritin level 85 most recently.

## 2022-09-26 NOTE — Patient Instructions (Signed)
It was a pleasure to see you today.  Thank you for giving [place] the opportunity to be involved in your care.  Below is a brief recap of your visit and next steps.  We will plan to see you again in 6 months.  Summary No medication changes today Repeat labs have been ordered. Please have them completed one morning later this week. Follow up in 6 months

## 2022-09-26 NOTE — Progress Notes (Signed)
Established Patient Office Visit  Subjective   Patient ID: Stephanie Ramos, female    DOB: 05-13-1946  Age: 77 y.o. MRN: [id number]  Chief Complaint  Patient presents with   Hyperlipidemia    Follow up   [name] returns to care today for follow-up.  She was last evaluated by me on 03/10/22.  No medication changes were made at that time and she received PCV 20 vaccination.  In the interim she has been seen by hematology for follow-up in the setting of iron overload.  There have otherwise been no acute interval events.  [name] reports feeling well today.  She is asymptomatic and has no acute concerns to discuss today.  Past Medical History:  Diagnosis Date   High iron content of liver determined by magnetic resonance imaging 08/02/2021   Hyperlipemia    Past Surgical History:  Procedure Laterality Date   BREAST SURGERY     CATARACT EXTRACTION W/PHACO Left 08/11/2019   Procedure: CATARACT EXTRACTION PHACO AND INTRAOCULAR LENS PLACEMENT (IOC) (CDE: 9.68);  Surgeon: [name], MD;  Location: [facility];  Service: Ophthalmology;  Laterality: Left;   CATARACT EXTRACTION W/PHACO Right 09/29/2019   Procedure: CATARACT EXTRACTION PHACO AND INTRAOCULAR LENS PLACEMENT RIGHT EYE;  Surgeon: [name], MD;  Location: [facility];  Service: Ophthalmology;  Laterality: Right;  CDE: 10.60   DILITATION & CURRETTAGE/HYSTROSCOPY WITH ESSURE     Social History   Tobacco Use   Smoking status: Never   Smokeless tobacco: Never  Substance Use Topics   Alcohol use: Never   Drug use: Never   No family history on file. Allergies  Allergen Reactions   Sulfa Antibiotics Rash   Review of Systems  Constitutional:  Negative for chills and fever.  HENT:  Negative for sore throat.   Respiratory:  Negative for cough and shortness of breath.   Cardiovascular:  Negative for chest pain, palpitations and leg swelling.  Gastrointestinal:  Negative for abdominal pain, blood in stool, constipation,  diarrhea, nausea and vomiting.  Genitourinary:  Negative for dysuria and hematuria.  Musculoskeletal:  Negative for myalgias.  Skin:  Negative for itching and rash.  Neurological:  Negative for dizziness and headaches.  Psychiatric/Behavioral:  Negative for depression and suicidal ideas.      Objective:     BP 126/68   Pulse 95   Ht  (1.549 m)   Wt 142 lb 6.4 oz (64.6 kg)   SpO2 98%   BMI 26.91 kg/m  BP Readings from Last 3 Encounters:  09/26/22 126/68  08/22/22 135/79  05/24/22 128/74   Physical Exam Vitals reviewed.  Constitutional:      General: She is not in acute distress.    Appearance: Normal appearance. She is not toxic-appearing.  HENT:     Head: Normocephalic and atraumatic.     Right Ear: External ear normal.     Left Ear: External ear normal.     Nose: Nose normal. No congestion or rhinorrhea.     Mouth/Throat:     Mouth: Mucous membranes are moist.     Pharynx: Oropharynx is clear. No oropharyngeal exudate or posterior oropharyngeal erythema.  Eyes:     General: No scleral icterus.    Extraocular Movements: Extraocular movements intact.     Conjunctiva/sclera: Conjunctivae normal.     Pupils: Pupils are equal, round, and reactive to light.  Cardiovascular:     Rate and Rhythm: Normal rate and regular rhythm.     Pulses: Normal  pulses.     Heart sounds: Normal heart sounds. No murmur heard.    No friction rub. No gallop.  Pulmonary:     Effort: Pulmonary effort is normal.     Breath sounds: Normal breath sounds. No wheezing, rhonchi or rales.  Abdominal:     General: Abdomen is flat. Bowel sounds are normal. There is no distension.     Palpations: Abdomen is soft.     Tenderness: There is no abdominal tenderness.  Musculoskeletal:        General: No swelling. Normal range of motion.     Cervical back: Normal range of motion.     Right lower leg: No edema.     Left lower leg: No edema.  Lymphadenopathy:     Cervical: No cervical adenopathy.   Skin:    General: Skin is warm and dry.     Capillary Refill: Capillary refill takes less than 2 seconds.     Coloration: Skin is not jaundiced.  Neurological:     General: No focal deficit present.     Mental Status: She is alert and oriented to person, place, and time.  Psychiatric:        Mood and Affect: Mood normal.        Behavior: Behavior normal.   Last CBC Lab Results  Component Value Date   WBC 7.0 08/22/2022   HGB 13.6 08/22/2022   HCT 43.7 08/22/2022   MCV 92.0 08/22/2022   MCH 28.6 08/22/2022   RDW 14.6 08/22/2022   PLT 376 08/22/2022   Last metabolic panel Lab Results  Component Value Date   GLUCOSE 97 01/23/2022   NA 141 01/23/2022   K 4.1 01/23/2022   CL 106 01/23/2022   CO2 29 01/23/2022   BUN 13 01/23/2022   CREATININE 0.60 01/23/2022   GFRNONAA >60 01/23/2022   CALCIUM 9.4 01/23/2022   PROT 7.6 01/23/2022   ALBUMIN 4.3 01/23/2022   LABGLOB 2.7 07/19/2021   AGRATIO 1.7 07/19/2021   BILITOT 0.3 01/23/2022   ALKPHOS 68 01/23/2022   AST 25 01/23/2022   ALT 29 01/23/2022   ANIONGAP 6 01/23/2022   Last lipids Lab Results  Component Value Date   CHOL 162 09/08/2021   HDL 66 09/08/2021   LDLCALC 81 09/08/2021   TRIG 79 09/08/2021   CHOLHDL 2.5 09/08/2021   Last hemoglobin A1c Lab Results  Component Value Date   HGBA1C 5.5 12/28/2017   Last thyroid functions Lab Results  Component Value Date   TSH 1.380 05/20/2021   Last vitamin D Lab Results  Component Value Date   VD25OH 38.10 01/23/2022   Last vitamin B12 and Folate Lab Results  Component Value Date   VITAMINB12 1,163 05/20/2021   The 10-year ASCVD risk score (Arnett DK, et al., 2019) is: 17.3%    Assessment & Plan:   Problem List Items Addressed This Visit       Hyperlipemia - Primary (Chronic)    Lipid panel last updated in April 2023.  She is currently prescribed simvastatin 40 mg daily, Zetia 10 mg daily, and Lovaza 1 g twice daily.  Her 10-year ASCVD risk is  17.3%. -Repeat lipid panel ordered today      Vertigo    Meclizine refilled today.  Condition remains stable.  Symptoms are seldom and meclizine is effective for symptom relief.      Elevated ferritin level    Followed by hematology.  Has recently undergone phlebotomy to achieve goal ferritin level <  100.  Ferritin level 85 most recently.      Return in about 6 months (around 03/28/2023).   Billie Lade, MD

## 2022-09-26 NOTE — Assessment & Plan Note (Signed)
Meclizine refilled today.  Condition remains stable.  Symptoms are seldom and meclizine is effective for symptom relief.

## 2022-09-26 NOTE — Assessment & Plan Note (Signed)
Lipid panel last updated in April 2023.  She is currently prescribed simvastatin 40 mg daily, Zetia 10 mg daily, and Lovaza 1 g twice daily.  Her 10-year ASCVD risk is 17.3%. -Repeat lipid panel ordered today

## 2022-09-27 DIAGNOSIS — R42 Dizziness and giddiness: Secondary | ICD-10-CM | POA: Diagnosis not present

## 2022-09-27 DIAGNOSIS — E7841 Elevated Lipoprotein(a): Secondary | ICD-10-CM | POA: Diagnosis not present

## 2022-09-27 DIAGNOSIS — Z1329 Encounter for screening for other suspected endocrine disorder: Secondary | ICD-10-CM | POA: Diagnosis not present

## 2022-09-28 LAB — B12 AND FOLATE PANEL
Folate: 15.3 ng/mL (ref >3.0)
Vitamin B-12: 1441 pg/mL — ABNORMAL HIGH (ref 232–1245)

## 2022-09-28 LAB — TSH+FREE T4
Free T4: 1.19 ng/dL (ref 0.82–1.77)
TSH: 2.77 u[IU]/mL (ref 0.450–4.500)

## 2022-09-28 LAB — LIPID PANEL
Chol/HDL Ratio: 2.6 ratio (ref 0.0–4.4)
Cholesterol, Total: 176 mg/dL (ref 100–199)
HDL: 69 mg/dL (ref >39)
LDL Chol Calc (NIH): 89 mg/dL (ref 0–99)
Triglycerides: 100 mg/dL (ref 0–149)
VLDL Cholesterol Cal: 18 mg/dL (ref 5–40)

## 2022-10-09 ENCOUNTER — Ambulatory Visit: Payer: No Typology Code available for payment source

## 2022-10-09 DIAGNOSIS — Z0000 Encounter for general adult medical examination without abnormal findings: Secondary | ICD-10-CM

## 2022-10-09 NOTE — Progress Notes (Signed)
Subjective:   Stephanie Ramos is a 77 y.o. female who presents for Medicare Annual (Subsequent) preventive examination.  Review of Systems    I connected with  Stephanie Ramos on 10/09/22 by a audio enabled telemedicine application and verified that I am speaking with the correct person using two identifiers.  Patient Location: Home  Provider Location: Office/Clinic  I discussed the limitations of evaluation and management by telemedicine. The patient expressed understanding and agreed to proceed.        Objective:    There were no vitals filed for this visit. There is no height or weight on file to calculate BMI.     08/22/2022    1:56 PM 05/24/2022    9:32 AM 04/24/2022    2:52 PM 03/23/2022    3:30 PM 02/21/2022    1:07 PM 01/23/2022    3:59 PM 11/28/2021    2:07 PM  Advanced Directives  Does Patient Have a Medical Advance Directive? No No No No No No No  Would patient like information on creating a medical advance directive? No - Patient declined No - Patient declined No - Patient declined No - Patient declined No - Patient declined No - Patient declined No - Patient declined    Current Medications (verified) Outpatient Encounter Medications as of 10/09/2022  Medication Sig   Calcium Carb-Cholecalciferol 600-800 MG-UNIT TABS Take 1 tablet by mouth in the morning and at bedtime.   cyanocobalamin 1000 MCG tablet Take 1,000 mcg by mouth daily.   ezetimibe (ZETIA) 10 MG tablet Take 1 tablet by mouth once daily   meclizine (ANTIVERT) 25 MG tablet Take 1 tablet (25 mg total) by mouth 3 (three) times daily as needed for dizziness.   omega-3 acid ethyl esters (LOVAZA) 1 g capsule Take by mouth 2 (two) times daily.   simvastatin (ZOCOR) 40 MG tablet Take 1 tablet (40 mg total) by mouth daily.   Vitamin A 2400 MCG (8000 UT) TABS Take 8,000 Units by mouth daily.   Vitamin E 400 units TABS Take 400 Units by mouth daily.   No facility-administered encounter medications on  file as of 10/09/2022.    Allergies (verified) Sulfa antibiotics   History: Past Medical History:  Diagnosis Date   High iron content of liver determined by magnetic resonance imaging 08/02/2021   Hyperlipemia    Past Surgical History:  Procedure Laterality Date   BREAST SURGERY     CATARACT EXTRACTION W/PHACO Left 08/11/2019   Procedure: CATARACT EXTRACTION PHACO AND INTRAOCULAR LENS PLACEMENT (IOC) (CDE: 9.68);  Surgeon: [name], MD;  Location: [facility];  Service: Ophthalmology;  Laterality: Left;   CATARACT EXTRACTION W/PHACO Right 09/29/2019   Procedure: CATARACT EXTRACTION PHACO AND INTRAOCULAR LENS PLACEMENT RIGHT EYE;  Surgeon: [name], MD;  Location: [facility];  Service: Ophthalmology;  Laterality: Right;  CDE: 10.60   DILITATION & CURRETTAGE/HYSTROSCOPY WITH ESSURE     No family history on file. Social History   Socioeconomic History   Marital status: Married    Spouse name: Not on file   Number of children: 1   Years of education: Not on file   Highest education level: Not on file  Occupational History   Not on file  Tobacco Use   Smoking status: Never   Smokeless tobacco: Never  Substance and Sexual Activity   Alcohol use: Never   Drug use: Never   Sexual activity: Not on file  Other Topics Concern   Not on file  Social History Narrative   Lives with husband of 49 years   They have one child and one grandchild      She enjoys reading romance novels      Diet: all food groups   Caffeine: Limited   Water: 1/2 gallon or more a day      Wears seat belt   Does not use phone while driving   Psychologist, 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    Social Determinants of Health   Financial Resource Strain: Low Risk  (10/03/2021)   Overall Financial Resource Strain (CARDIA)    Difficulty of Paying Living Expenses: Not hard at all  Food Insecurity: No Food Insecurity (10/03/2021)   Hunger Vital Sign    Worried About Running Out of Food in the  Last Year: Never true    Ran Out of Food in the Last Year: Never true  Transportation Needs: No Transportation Needs (10/03/2021)   PRAPARE - Administrator, Civil Service (Medical): No    Lack of Transportation (Non-Medical): No  Physical Activity: Sufficiently Active (10/03/2021)   Exercise Vital Sign    Days of Exercise per Week: 7 days    Minutes of Exercise per Session: 40 min  Stress: No Stress Concern Present (10/03/2021)   [employer] of Occupational Health - Occupational Stress Questionnaire    Feeling of Stress : Not at all  Social Connections: Moderately Integrated (10/03/2021)   Social Connection and Isolation Panel [NHANES]    Frequency of Communication with Friends and Family: More than three times a week    Frequency of Social Gatherings with Friends and Family: More than three times a week    Attends Religious Services: More than 4 times per year    Active Member of Clubs or Organizations: No    Attends Banker Meetings: Never    Marital Status: Married    Tobacco Counseling Counseling given: Not Answered   Clinical Intake:    Diabetic?no    Activities of Daily Living     No data to display           Patient Care Team: Billie Lade, MD as PCP - General (Internal Medicine) Doreatha Massed, MD as Medical Oncologist (Hematology)  Indicate any recent Medical Services you may have received from other than [facility] providers in the past year (date may be approximate).     Assessment:   This is a routine wellness examination for Stephanie Ramos.  Hearing/Vision screen No results found.  Dietary issues and exercise activities discussed:     Goals Addressed   None   Depression Screen    09/26/2022   11:27 AM 03/10/2022   11:08 AM 10/03/2021   10:18 AM 10/03/2021   10:16 AM 09/08/2021   10:06 AM 07/21/2021   10:34 AM 05/20/2021   11:40 AM  PHQ 2/9 Scores  PHQ - 2 Score 0 0 0 0 0 0 0    Fall Risk    09/26/2022   11:27 AM  03/10/2022   11:08 AM 10/03/2021   10:17 AM 09/08/2021   10:06 AM 07/21/2021   10:33 AM  Fall Risk   Falls in the past year? 0 0 0 0 0  Number falls in past yr: 0 0 0 0 0  Injury with Fall? 0 0 0 0 0  Risk for fall due to :  No Fall Risks History of fall(s);No Fall Risks No Fall Risks No  Fall Risks  Follow up  Falls evaluation completed Falls evaluation completed Falls evaluation completed Falls evaluation completed    FALL RISK PREVENTION PERTAINING TO THE HOME:  Any stairs in or around the home? Yes  If so, are there any without handrails? No  Home free of loose throw rugs in walkways, pet beds, electrical cords, etc? Yes  Adequate lighting in your home to reduce risk of falls? Yes   ASSISTIVE DEVICES UTILIZED TO PREVENT FALLS:  Life alert? No  Use of a cane, walker or w/c? No  Grab bars in the bathroom? No  Shower chair or bench in shower? No  Elevated toilet seat or a handicapped toilet? No       10/03/2021   10:18 AM  MMSE - Mini Mental State Exam  Not completed: Unable to complete        10/03/2021   10:18 AM 09/29/2020   11:13 AM  6CIT Screen  What Year? 0 points 0 points  What month? 0 points 0 points  What time? 0 points 0 points  Count back from 20 0 points 0 points  Months in reverse 0 points 0 points  Repeat phrase 0 points 0 points  Total Score 0 points 0 points    Immunizations Immunization History  Administered Date(s) Administered   PNEUMOCOCCAL CONJUGATE-20 03/10/2022    TDAP status: Due, Education has been provided regarding the importance of this vaccine. Advised may receive this vaccine at local pharmacy or Health Dept. Aware to provide a copy of the vaccination record if obtained from local pharmacy or Health Dept. Verbalized acceptance and understanding.  Flu Vaccine status: Due, Education has been provided regarding the importance of this vaccine. Advised may receive this vaccine at local pharmacy or Health Dept. Aware to provide a copy of the  vaccination record if obtained from local pharmacy or Health Dept. Verbalized acceptance and understanding.  Pneumococcal vaccine status: Up to date  Covid-19 vaccine status: Information provided on how to obtain vaccines.   Qualifies for Shingles Vaccine? Yes   Zostavax completed No   Shingrix Completed?: No.    Education has been provided regarding the importance of this vaccine. Patient has been advised to call insurance company to determine out of pocket expense if they have not yet received this vaccine. Advised may also receive vaccine at local pharmacy or Health Dept. Verbalized acceptance and understanding.  Screening Tests Health Maintenance  Topic Date Due   DTaP/Tdap/Td (1 - Tdap) Never done   Zoster Vaccines- Shingrix (1 of 2) Never done   INFLUENZA VACCINE  01/04/2023   Medicare Annual Wellness (AWV)  10/09/2023   Pneumonia Vaccine 16+ Years old  Completed   DEXA SCAN  Completed   Hepatitis C Screening  Completed   HPV VACCINES  Aged Out   COVID-19 Vaccine  Discontinued   Fecal DNA (Cologuard)  Discontinued    Health Maintenance  Health Maintenance Due  Topic Date Due   DTaP/Tdap/Td (1 - Tdap) Never done   Zoster Vaccines- Shingrix (1 of 2) Never done    Colorectal cancer screening: Type of screening: Cologuard. Completed 03/21/22. Repeat every   years  Mammogram status: No longer required due to age.  Bone Density status: Completed 11/22/16. Results reflect: Bone density results: NORMAL. Repeat every 2 years.  Lung Cancer Screening: (Low Dose CT Chest recommended if Age 76-80 years, 30 pack-year currently smoking OR have quit w/in 15years.) does not qualify.   Lung Cancer Screening Referral: no  Additional  Screening:  Hepatitis C Screening: does qualify; Completed 06/24/21  Vision Screening: Recommended annual ophthalmology exams for early detection of glaucoma and other disorders of the eye. Is the patient up to date with their annual eye exam?  No  Who  is the provider or what is the name of the office in which the patient attends annual eye exams? N/a If pt is not established with a provider, would they like to be referred to a provider to establish care? No .   Dental Screening: Recommended annual dental exams for proper oral hygiene  Community Resource Referral / Chronic Care Management: CRR required this visit?  No   CCM required this visit?  No      Plan:     I have personally reviewed and noted the following in the patient's chart:   Medical and social history Use of alcohol, tobacco or illicit drugs  Current medications and supplements including opioid prescriptions. Patient is not currently taking opioid prescriptions. Functional ability and status Nutritional status Physical activity Advanced directives List of other physicians Hospitalizations, surgeries, and ER visits in previous 12 months Vitals Screenings to include cognitive, depression, and falls Referrals and appointments  In addition, I have reviewed and discussed with patient certain preventive protocols, quality metrics, and best practice recommendations. A written personalized care plan for preventive services as well as general preventive health recommendations were provided to patient.     Jasper Riling, CMA   10/09/2022

## 2022-10-09 NOTE — Patient Instructions (Signed)
  Stephanie Ramos , Thank you for taking time to come for your Medicare Wellness Visit. I appreciate your ongoing commitment to your health goals. Please review the following plan we discussed and let me know if I can assist you in the future.   These are the goals we discussed:  Goals      Patient Stated     None at this time.     Patient Stated     None        This is a list of the screening recommended for you and due dates:  Health Maintenance  Topic Date Due   DTaP/Tdap/Td vaccine (1 - Tdap) Never done   Zoster (Shingles) Vaccine (1 of 2) Never done   Flu Shot  01/04/2023   Medicare Annual Wellness Visit  10/09/2023   Pneumonia Vaccine  Completed   DEXA scan (bone density measurement)  Completed   Hepatitis C Screening: USPSTF Recommendation to screen - Ages 65-79 yo.  Completed   HPV Vaccine  Aged Out   COVID-19 Vaccine  Discontinued   Cologuard (Stool DNA test)  Discontinued

## 2022-10-11 ENCOUNTER — Other Ambulatory Visit: Payer: Self-pay | Admitting: Internal Medicine

## 2022-10-11 DIAGNOSIS — E7841 Elevated Lipoprotein(a): Secondary | ICD-10-CM

## 2022-12-22 ENCOUNTER — Inpatient Hospital Stay: Payer: No Typology Code available for payment source | Attending: Hematology

## 2022-12-22 DIAGNOSIS — R7989 Other specified abnormal findings of blood chemistry: Secondary | ICD-10-CM | POA: Diagnosis not present

## 2022-12-22 LAB — HEPATIC FUNCTION PANEL
ALT: 40 U/L (ref 0–44)
AST: 35 U/L (ref 15–41)
Albumin: 4 g/dL (ref 3.5–5.0)
Alkaline Phosphatase: 78 U/L (ref 38–126)
Bilirubin, Direct: <0.1 mg/dL (ref 0.0–0.2)
Total Bilirubin: 0.6 mg/dL (ref 0.3–1.2)
Total Protein: 7.2 g/dL (ref 6.5–8.1)

## 2022-12-22 LAB — CBC
HCT: 43.4 % (ref 36.0–46.0)
Hemoglobin: 13.8 g/dL (ref 12.0–15.0)
MCH: 30.3 pg (ref 26.0–34.0)
MCHC: 31.8 g/dL (ref 30.0–36.0)
MCV: 95.4 fL (ref 80.0–100.0)
Platelets: 345 10*3/uL (ref 150–400)
RBC: 4.55 MIL/uL (ref 3.87–5.11)
RDW: 12.5 % (ref 11.5–15.5)
WBC: 5.8 10*3/uL (ref 4.0–10.5)
nRBC: 0 % (ref 0.0–0.2)

## 2022-12-22 LAB — IRON AND TIBC
Iron: 69 ug/dL (ref 28–170)
Saturation Ratios: 18 % (ref 10.4–31.8)
TIBC: 383 ug/dL (ref 250–450)
UIBC: 314 ug/dL

## 2022-12-22 LAB — FERRITIN: Ferritin: 95 ng/mL (ref 11–307)

## 2022-12-28 ENCOUNTER — Inpatient Hospital Stay: Payer: No Typology Code available for payment source | Admitting: Oncology

## 2022-12-28 VITALS — BP 129/61 | HR 88 | Temp 98.2°F | Resp 16 | Wt 144.4 lb

## 2022-12-28 DIAGNOSIS — R7989 Other specified abnormal findings of blood chemistry: Secondary | ICD-10-CM | POA: Diagnosis not present

## 2022-12-28 NOTE — Progress Notes (Signed)
[facility] [address] [place] [address]    Clinic Day:  12/28/2022  Referring physician: Billie Lade, MD  Patient Care Team: Billie Lade, MD as PCP - General (Internal Medicine) Doreatha Massed, MD as Medical Oncologist (Hematology)   ASSESSMENT & PLAN:   Assessment: 1.  Elevated ferritin levels: - Patient was on iron tablet daily for many years, stopped in August 2022 when her ferritin was found to be elevated at 1609 with percent saturation 33. - She started taking woman's once a day vitamin which has iron in it. - Recent ferritin on 05/16/2021 was 1875.  He was told to stop taking woman's vitamin. - She denies any prior history of transfusion or parenteral iron therapy. - Hemochromatosis workup: Negative - MRI liver (07/27/2021): Hepatic iron deposition. - She was on intermittent phlebotomies, last phlebotomy on 04/24/2022.  2.  Social/family history: -She lives at home with her husband.  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 - No family history of hemochromatosis or malignancies.  Plan: 1.  Elevated ferritin levels: - Her last phlebotomy was on 04/24/2022. - Labs from 12/22/2022 showed ferritin at 95 and iron saturation of 10%.  Hemoglobin 13.8 with a normal differential. -No indication for phlebotomy at this time. -Return to clinic in 4 months with repeat lab work and see MD/APP for follow-up for few days later..  No orders of the defined types were placed in this encounter.  PLAN SUMMARY: >> No phlebotomy today. >> Return to clinic in 4 months for follow-up with labs a few days before.    I spent 20 minutes dedicated to the care of this patient (face-to-face and non-face-to-face) on the date of the encounter to include what is described in the assessment and plan.   Mauro Kaufmann, NP   7/25/202410:39 AM  CHIEF COMPLAINT:   Diagnosis: elevated ferritin   Prior Therapy: Intermittent  phlebotomy  Current Therapy: Observation   INTERVAL HISTORY:   Jakeya is a 77 y.o. female presenting to clinic today for follow up of elevated ferritin. She was last in clinic by Dr. Ellin Saba on 08/22/2022.    Due to signs of hepatic and reticuloendothelial iron deposition, she underwent phlebotomy therapy to reach goal of ferritin <100 in November 2023.   Today, she states that she is doing well overall. Has history of vertigo and has an occasional dizzy spell.  Her appetite level is at 100%. Her energy level is at 100%. She denies any fevers, night sweats or weight loss.  No infections recently.   PAST MEDICAL HISTORY:   Past Medical History: Past Medical History:  Diagnosis Date   High iron content of liver determined by magnetic resonance imaging 08/02/2021   Hyperlipemia     Surgical History: Past Surgical History:  Procedure Laterality Date   BREAST SURGERY     CATARACT EXTRACTION W/PHACO Left 08/11/2019   Procedure: CATARACT EXTRACTION PHACO AND INTRAOCULAR LENS PLACEMENT (IOC) (CDE: 9.68);  Surgeon: [name], MD;  Location: [facility];  Service: Ophthalmology;  Laterality: Left;   CATARACT EXTRACTION W/PHACO Right 09/29/2019   Procedure: CATARACT EXTRACTION PHACO AND INTRAOCULAR LENS PLACEMENT RIGHT EYE;  Surgeon: [name], MD;  Location: [facility];  Service: Ophthalmology;  Laterality: Right;  CDE: 10.60   DILITATION & CURRETTAGE/HYSTROSCOPY WITH ESSURE      Social History: Social History   Socioeconomic History   Marital status: Married    Spouse name: Not on file  Number of children: 1   Years of education: Not on file   Highest education level: Not on file  Occupational History   Not on file  Tobacco Use   Smoking status: Never   Smokeless tobacco: Never  Substance and Sexual Activity   Alcohol use: Never   Drug use: Never   Sexual activity: Not on file  Other Topics Concern   Not on file  Social History Narrative   Lives with husband of 49  years   They have one child and one grandchild      She enjoys reading romance novels      Diet: all food groups   Caffeine: Limited   Water: 1/2 gallon or more a day      Wears seat belt   Does not use phone while driving   Psychologist, sport and exercise at home   Chartered certified accountant safe box    Social Determinants of Health   Financial Resource Strain: Low Risk  (10/09/2022)   Overall Financial Resource Strain (CARDIA)    Difficulty of Paying Living Expenses: Not hard at all  Food Insecurity: No Food Insecurity (10/09/2022)   Hunger Vital Sign    Worried About Running Out of Food in the Last Year: Never true    Ran Out of Food in the Last Year: Never true  Transportation Needs: No Transportation Needs (10/09/2022)   PRAPARE - Administrator, Civil Service (Medical): No    Lack of Transportation (Non-Medical): No  Physical Activity: Sufficiently Active (10/09/2022)   Exercise Vital Sign    Days of Exercise per Week: 5 days    Minutes of Exercise per Session: 30 min  Stress: No Stress Concern Present (10/09/2022)   [employer] of Occupational Health - Occupational Stress Questionnaire    Feeling of Stress : Not at all  Social Connections: Moderately Integrated (10/09/2022)   Social Connection and Isolation Panel [NHANES]    Frequency of Communication with Friends and Family: More than three times a week    Frequency of Social Gatherings with Friends and Family: More than three times a week    Attends Religious Services: More than 4 times per year    Active Member of [employer] or Organizations: No    Attends Banker Meetings: Never    Marital Status: Married  Catering manager Violence: Not At Risk (10/09/2022)   Humiliation, Afraid, Rape, and Kick questionnaire    Fear of Current or Ex-Partner: No    Emotionally Abused: No    Physically Abused: No    Sexually Abused: No    Family History: No family history on file.  Current Medications:  Current Outpatient  Medications:    Calcium Carb-Cholecalciferol 600-800 MG-UNIT TABS, Take 1 tablet by mouth in the morning and at bedtime., Disp: , Rfl:    cyanocobalamin 1000 MCG tablet, Take 1,000 mcg by mouth daily., Disp: , Rfl:    ezetimibe (ZETIA) 10 MG tablet, Take 1 tablet by mouth once daily, Disp: 90 tablet, Rfl: 0   meclizine (ANTIVERT) 25 MG tablet, Take 1 tablet (25 mg total) by mouth 3 (three) times daily as needed for dizziness., Disp: 30 tablet, Rfl: 2   omega-3 acid ethyl esters (LOVAZA) 1 g capsule, Take by mouth 2 (two) times daily., Disp: , Rfl:    simvastatin (ZOCOR) 40 MG tablet, Take 1 tablet (40 mg total) by mouth daily., Disp: 90 tablet, Rfl: 1   Vitamin A 2400 MCG (8000  UT) TABS, Take 8,000 Units by mouth daily., Disp: , Rfl:    Vitamin E 400 units TABS, Take 400 Units by mouth daily., Disp: , Rfl:    Allergies: Allergies  Allergen Reactions   Sulfa Antibiotics Rash    REVIEW OF SYSTEMS:   Review of Systems  Constitutional:  Negative for appetite change, fatigue, fever and unexpected weight change.  HENT:   Negative for nosebleeds, sore throat and trouble swallowing.   Eyes: Negative.   Respiratory: Negative.  Negative for cough, shortness of breath and wheezing.   Cardiovascular: Negative.  Negative for chest pain and leg swelling.  Gastrointestinal:  Negative for abdominal pain, blood in stool, constipation, diarrhea, nausea and vomiting.  Endocrine: Negative.   Genitourinary: Negative.  Negative for bladder incontinence, hematuria and nocturia.   Musculoskeletal: Negative.  Negative for back pain and flank pain.  Skin: Negative.   Neurological: Negative.  Negative for dizziness, headaches, light-headedness and numbness.  Hematological: Negative.   Psychiatric/Behavioral: Negative.  Negative for confusion. The patient is not nervous/anxious.      VITALS:   There were no vitals taken for this visit.  Wt Readings from Last 3 Encounters:  09/26/22 142 lb 6.4 oz (64.6 kg)   08/22/22 140 lb 11.2 oz (63.8 kg)  05/24/22 132 lb 0.9 oz (59.9 kg)    There is no height or weight on file to calculate BMI.  Performance status (ECOG): 1 - Symptomatic but completely ambulatory  PHYSICAL EXAM:   Physical Exam Constitutional:      Appearance: Normal appearance.  Cardiovascular:     Rate and Rhythm: Normal rate and regular rhythm.  Pulmonary:     Effort: Pulmonary effort is normal.     Breath sounds: Normal breath sounds.  Abdominal:     General: Bowel sounds are normal.     Palpations: Abdomen is soft.  Musculoskeletal:        General: No swelling. Normal range of motion.  Neurological:     Mental Status: She is alert and oriented to person, place, and time. Mental status is at baseline.     LABS:      Latest Ref Rng & Units 12/22/2022   12:28 PM 08/22/2022    1:01 PM 05/24/2022    8:43 AM  CBC  WBC 4.0 - 10.5 K/uL 5.8  7.0  5.5   Hemoglobin 12.0 - 15.0 g/dL 29.5  62.1  30.8   Hematocrit 36.0 - 46.0 % 43.4  43.7  41.0   Platelets 150 - 400 K/uL 345  376  423       Latest Ref Rng & Units 12/22/2022   12:28 PM 01/23/2022    1:07 PM 09/08/2021   11:15 AM  CMP  Glucose 70 - 99 mg/dL  97  657   BUN 8 - 23 mg/dL  13  16   Creatinine 8.46 - 1.00 mg/dL  9.62  9.52   Sodium 841 - 145 mmol/L  141  145   Potassium 3.5 - 5.1 mmol/L  4.1  4.4   Chloride 98 - 111 mmol/L  106  106   CO2 22 - 32 mmol/L  29  22   Calcium 8.9 - 10.3 mg/dL  9.4  9.7   Total Protein 6.5 - 8.1 g/dL 7.2  7.6    Total Bilirubin 0.3 - 1.2 mg/dL 0.6  0.3    Alkaline Phos 38 - 126 U/L 78  68    AST 15 - 41 U/L 35  25    ALT 0 - 44 U/L 40  29       No results found for: "CEA1", "CEA" / No results found for: "CEA1", "CEA" No results found for: "PSA1" No results found for: "ZOX096" No results found for: "CAN125"  No results found for: "TOTALPROTELP", "ALBUMINELP", "A1GS", "A2GS", "BETS", "BETA2SER", "GAMS", "MSPIKE", "SPEI" Lab Results  Component Value Date   TIBC 383 12/22/2022    TIBC 408 08/22/2022   TIBC 434 05/24/2022   FERRITIN 95 12/22/2022   FERRITIN 85 08/22/2022   FERRITIN 88 05/24/2022   IRONPCTSAT 18 12/22/2022   IRONPCTSAT 10 (L) 08/22/2022   IRONPCTSAT 10 (L) 05/24/2022   Lab Results  Component Value Date   LDH 174 05/20/2021     STUDIES:   No results found.

## 2023-01-01 ENCOUNTER — Other Ambulatory Visit: Payer: Self-pay | Admitting: Internal Medicine

## 2023-01-01 DIAGNOSIS — E7841 Elevated Lipoprotein(a): Secondary | ICD-10-CM

## 2023-02-08 ENCOUNTER — Other Ambulatory Visit: Payer: Self-pay

## 2023-02-08 DIAGNOSIS — E7841 Elevated Lipoprotein(a): Secondary | ICD-10-CM

## 2023-02-08 MED ORDER — SIMVASTATIN 40 MG PO TABS
40.0000 mg | ORAL_TABLET | Freq: Every day | ORAL | 1 refills | Status: AC
Start: 2023-02-08 — End: ?

## 2023-02-15 ENCOUNTER — Emergency Department (HOSPITAL_COMMUNITY)
Admission: EM | Admit: 2023-02-15 | Discharge: 2023-02-15 | Disposition: A | Discharge status: Home / Self Care | Payer: No Typology Code available for payment source | Attending: Emergency Medicine | Admitting: Emergency Medicine

## 2023-02-15 ENCOUNTER — Emergency Department (HOSPITAL_COMMUNITY): Payer: No Typology Code available for payment source

## 2023-02-15 ENCOUNTER — Other Ambulatory Visit: Payer: Self-pay

## 2023-02-15 ENCOUNTER — Encounter (HOSPITAL_COMMUNITY): Payer: Self-pay

## 2023-02-15 DIAGNOSIS — S42211A Unspecified displaced fracture of surgical neck of right humerus, initial encounter for closed fracture: Secondary | ICD-10-CM | POA: Diagnosis not present

## 2023-02-15 DIAGNOSIS — W19XXXA Unspecified fall, initial encounter: Secondary | ICD-10-CM | POA: Insufficient documentation

## 2023-02-15 DIAGNOSIS — S4991XA Unspecified injury of right shoulder and upper arm, initial encounter: Secondary | ICD-10-CM | POA: Diagnosis not present

## 2023-02-15 DIAGNOSIS — S42291A Other displaced fracture of upper end of right humerus, initial encounter for closed fracture: Secondary | ICD-10-CM | POA: Diagnosis not present

## 2023-02-15 MED ORDER — OXYCODONE-ACETAMINOPHEN 5-325 MG PO TABS
1.0000 | ORAL_TABLET | Freq: Once | ORAL | Status: AC
  Administered 2023-02-15: 1 via ORAL
  Filled 2023-02-15: qty 1

## 2023-02-15 MED ORDER — OXYCODONE-ACETAMINOPHEN 5-325 MG PO TABS
1.0000 | ORAL_TABLET | Freq: Four times a day (QID) | ORAL | 0 refills | Status: DC | PRN
Start: 2023-02-15 — End: 2023-10-16

## 2023-02-15 NOTE — Discharge Instructions (Addendum)
Take Tylenol or Motrin for pain.  Follow-up with orthopedic surgeon next week

## 2023-02-15 NOTE — ED Provider Notes (Signed)
[place] EMERGENCY DEPARTMENT AT [facility] Provider Note   CSN: [id number] Arrival date & time: 02/15/23  1715     History  Chief Complaint  Patient presents with   Fall   Shoulder Injury    Stephanie Ramos is a 77 y.o. female.  Patient fell on her right shoulder.  No loss of consciousness.  No head injury  The history is provided by the patient and medical records. No language interpreter was used.  Fall This is a new problem. The current episode started 12 to 24 hours ago. The problem occurs rarely. The problem has been resolved. Pertinent negatives include no chest pain, no abdominal pain and no headaches. Exacerbated by: Movement. Nothing relieves the symptoms. She has tried nothing for the symptoms.       Home Medications Prior to Admission medications   Medication Sig Start Date End Date Taking? Authorizing Provider  oxyCODONE-acetaminophen (PERCOCET/ROXICET) 5-325 MG tablet Take 1 tablet by mouth every 6 (six) hours as needed for severe pain. 02/15/23  Yes Bethann Berkshire, MD  Calcium Carb-Cholecalciferol 600-800 MG-UNIT TABS Take 1 tablet by mouth in the morning and at bedtime.    [provider]  cyanocobalamin 1000 MCG tablet Take 1,000 mcg by mouth daily.    [provider]  ezetimibe (ZETIA) 10 MG tablet Take 1 tablet by mouth once daily 01/01/23   Billie Lade, MD  meclizine (ANTIVERT) 25 MG tablet Take 1 tablet (25 mg total) by mouth 3 (three) times daily as needed for dizziness. 09/26/22   Billie Lade, MD  omega-3 acid ethyl esters (LOVAZA) 1 g capsule Take by mouth 2 (two) times daily.    [provider]  simvastatin (ZOCOR) 40 MG tablet Take 1 tablet (40 mg total) by mouth daily. 02/08/23   Billie Lade, MD  Vitamin A 2400 MCG (8000 UT) TABS Take 8,000 Units by mouth daily.    [provider]  Vitamin E 400 units TABS Take 400 Units by mouth daily.    [provider]      Allergies     Sulfa antibiotics    Review of Systems   Review of Systems  Constitutional:  Negative for appetite change and fatigue.  HENT:  Negative for congestion, ear discharge and sinus pressure.   Eyes:  Negative for discharge.  Respiratory:  Negative for cough.   Cardiovascular:  Negative for chest pain.  Gastrointestinal:  Negative for abdominal pain and diarrhea.  Genitourinary:  Negative for frequency and hematuria.  Musculoskeletal:  Negative for back pain.       Right shoulder pain  Skin:  Negative for rash.  Neurological:  Negative for seizures and headaches.  Psychiatric/Behavioral:  Negative for hallucinations.     Physical Exam Updated Vital Signs BP 130/69   Pulse 65   Temp 97.7 F (36.5 C)   Resp 16   Ht 5\' 1"  (1.549 m)   Wt 64.9 kg   SpO2 99%   BMI 27.02 kg/m  Physical Exam Vitals and nursing note reviewed.  Constitutional:      Appearance: She is well-developed.  HENT:     Head: Normocephalic.     Nose: Nose normal.  Eyes:     General: No scleral icterus.    Conjunctiva/sclera: Conjunctivae normal.  Neck:     Thyroid: No thyromegaly.  Cardiovascular:     Rate and Rhythm: Normal rate and regular rhythm.     Heart sounds: No murmur heard.  No friction rub. No gallop.  Pulmonary:     Breath sounds: No stridor. No wheezing or rales.  Chest:     Chest wall: No tenderness.  Abdominal:     General: There is no distension.     Tenderness: There is no abdominal tenderness. There is no rebound.  Musculoskeletal:     Cervical back: Neck supple.     Comments: Tender right shoulder  Lymphadenopathy:     Cervical: No cervical adenopathy.  Skin:    Findings: No erythema or rash.  Neurological:     Mental Status: She is alert and oriented to person, place, and time.     Motor: No abnormal muscle tone.     Coordination: Coordination normal.  Psychiatric:        Behavior: Behavior normal.     ED Results / Procedures / Treatments   Labs (all labs ordered  are listed, but only abnormal results are displayed) Labs Reviewed - No data to display  EKG None  Radiology DG Shoulder Right  Result Date: 02/15/2023 CLINICAL DATA:  Fall EXAM: RIGHT SHOULDER - 2+ VIEW COMPARISON:  None Available. FINDINGS: No humeral head dislocation. Acute mildly comminuted fracture involving the right humeral neck with lucency extending vertically toward the articular surface at the humeral head. Mild fracture displacement and angulation IMPRESSION: Acute mildly comminuted and displaced fracture involving the right humeral neck with possible intra-articular extension. Electronically Signed   By: Jasmine Pang M.D.   On: 02/15/2023 20:05    Procedures Procedures    Medications Ordered in ED Medications  oxyCODONE-acetaminophen (PERCOCET/ROXICET) 5-325 MG per tablet 1 tablet (has no administration in time range)    ED Course/ Medical Decision Making/ A&P                                 Medical Decision Making Amount and/or Complexity of Data Reviewed Radiology: ordered.  Risk Prescription drug management.   Patient with a mildly comminuted right humeral neck fracture.  She is given a sling and pain medicines and will follow-up with Ortho        Final Clinical Impression(s) / ED Diagnoses Final diagnoses:  Injury of right shoulder, initial encounter    Rx / DC Orders ED Discharge Orders          Ordered    oxyCODONE-acetaminophen (PERCOCET/ROXICET) 5-325 MG tablet  Every 6 hours PRN        02/15/23 2016              Bethann Berkshire, MD 02/16/23 1102

## 2023-02-15 NOTE — ED Triage Notes (Signed)
Fall around 5:20 while working in the [employer] should pain  Denies hitting head, LOC and blood thinners Denies neck and back pain

## 2023-02-16 ENCOUNTER — Ambulatory Visit: Payer: No Typology Code available for payment source | Admitting: Family Medicine

## 2023-02-16 ENCOUNTER — Other Ambulatory Visit: Payer: Self-pay | Admitting: Family Medicine

## 2023-02-16 ENCOUNTER — Telehealth: Payer: Self-pay | Admitting: Internal Medicine

## 2023-02-16 DIAGNOSIS — S4991XD Unspecified injury of right shoulder and upper arm, subsequent encounter: Secondary | ICD-10-CM

## 2023-02-16 NOTE — Telephone Encounter (Signed)
Referral placed.

## 2023-02-16 NOTE — Telephone Encounter (Signed)
(  Stephanie Ramos pt) The patient came in this am and was put on your schedule for 11am but pt waited in the lobby until her appt time and the front didn't realize that so she was never checked in. It was almost 12 when they realized it and her appt was cancelled. She went to ER yesterday for arm fracture and was told to follow up with ortho but she doesn't have an appt with anyone. Could a stat referral be entered for ortho so we can get her seen hopefully next week? (I tried to call ortho down the street but they were closed for the day)

## 2023-02-19 ENCOUNTER — Ambulatory Visit
Admission: RE | Admit: 2023-02-19 | Discharge: 2023-02-19 | Disposition: A | Discharge status: Home / Self Care | Payer: No Typology Code available for payment source | Source: Ambulatory Visit | Attending: Orthopedic Surgery | Admitting: Orthopedic Surgery

## 2023-02-19 ENCOUNTER — Other Ambulatory Visit: Payer: Self-pay | Admitting: Orthopedic Surgery

## 2023-02-19 ENCOUNTER — Other Ambulatory Visit: Payer: No Typology Code available for payment source

## 2023-02-19 DIAGNOSIS — M19011 Primary osteoarthritis, right shoulder: Secondary | ICD-10-CM | POA: Diagnosis not present

## 2023-02-19 DIAGNOSIS — S42294A Other nondisplaced fracture of upper end of right humerus, initial encounter for closed fracture: Secondary | ICD-10-CM | POA: Diagnosis not present

## 2023-02-19 DIAGNOSIS — S4291XA Fracture of right shoulder girdle, part unspecified, initial encounter for closed fracture: Secondary | ICD-10-CM

## 2023-02-19 DIAGNOSIS — S42291A Other displaced fracture of upper end of right humerus, initial encounter for closed fracture: Secondary | ICD-10-CM | POA: Diagnosis not present

## 2023-02-22 NOTE — Telephone Encounter (Signed)
Has been seen by ortho and has MRI scheduled

## 2023-03-02 ENCOUNTER — Telehealth: Payer: Self-pay | Admitting: Internal Medicine

## 2023-03-02 NOTE — Telephone Encounter (Signed)
Loraine Leriche called from [facility] patient needs a refill on medicine.  Prescription Request  03/02/2023  LOV: 09/26/2022  What is the name of the medication or equipment? simvastatin (ZOCOR) 40 MG tablet [[id number]]   Have you contacted your pharmacy to request a refill? no  Which pharmacy would you like this sent to?  [facility] [address], [place] - [address] [place] [address] Phone: [phone] Fax: [phone]    Patient notified that their request is being sent to the clinical staff for review and that they should receive a response within 2 business days.   Please advise at  [facility] called

## 2023-03-02 NOTE — Telephone Encounter (Signed)
Refills sent on 02/08/2023

## 2023-03-05 DIAGNOSIS — S42294D Other nondisplaced fracture of upper end of right humerus, subsequent encounter for fracture with routine healing: Secondary | ICD-10-CM | POA: Diagnosis not present

## 2023-03-06 ENCOUNTER — Other Ambulatory Visit: Payer: Self-pay

## 2023-03-06 DIAGNOSIS — E7841 Elevated Lipoprotein(a): Secondary | ICD-10-CM

## 2023-03-06 MED ORDER — SIMVASTATIN 40 MG PO TABS: 40.0000 mg | ORAL_TABLET | Freq: Every day | ORAL | 0 refills | Status: DC

## 2023-03-06 NOTE — Telephone Encounter (Signed)
100 day supply sent to pharmacy

## 2023-03-06 NOTE — Telephone Encounter (Signed)
Mark from [facility] calling says pt is needing refill on simvastatin (ZOCOR) 40 MG tablet [[id number]] - requesting 100 day supply. Please advise Thank you

## 2023-03-15 ENCOUNTER — Telehealth: Payer: Self-pay

## 2023-03-15 NOTE — Telephone Encounter (Signed)
Transition Care Management Follow-up Telephone Call Date of discharge and from where: 02/15/2023 [facility] How have you been since you were released from the hospital? Patient stated she is feeling better, but still has some pain in her shoulder. Any questions or concerns? No  Items Reviewed: Did the pt receive and understand the discharge instructions provided? Yes  Medications obtained and verified? Yes  Other? No  Any new allergies since your discharge? No  Dietary orders reviewed? Yes Do you have support at home? Yes   Follow up appointments reviewed:  PCP Hospital f/u appt confirmed?  Patient received referral from PCP via phone to Orthopedic Surgeon  Scheduled to see  on 02/16/2023 @ [facility]. Specialist Hospital f/u appt confirmed? Yes  Scheduled to see Teryl Lucy, MD on 02/19/2023 @ [facility]. Are transportation arrangements needed? No  If their condition worsens, is the pt aware to call PCP or go to the Emergency Dept.? Yes Was the patient provided with contact information for the PCP's office or ED? Yes Was to pt encouraged to call back with questions or concerns? Yes   [facility]  [facility], [facility] Guide Direct Dial: [phone]  Website: [email or web address]

## 2023-03-15 NOTE — Telephone Encounter (Signed)
Transition Care Management Unsuccessful Follow-up Telephone Call  Date of discharge and from where:  02/15/2023 [facility]  Attempts:  1st Attempt  Reason for unsuccessful TCM follow-up call:  Left voice message  Seena Face [facility]  [facility], [facility] Resource Care Guide Direct Dial: [phone]  Website: [email or web address]

## 2023-03-26 DIAGNOSIS — S42294D Other nondisplaced fracture of upper end of right humerus, subsequent encounter for fracture with routine healing: Secondary | ICD-10-CM | POA: Diagnosis not present

## 2023-03-28 DIAGNOSIS — M6281 Muscle weakness (generalized): Secondary | ICD-10-CM | POA: Diagnosis not present

## 2023-03-28 DIAGNOSIS — S42294D Other nondisplaced fracture of upper end of right humerus, subsequent encounter for fracture with routine healing: Secondary | ICD-10-CM | POA: Diagnosis not present

## 2023-03-28 DIAGNOSIS — M25611 Stiffness of right shoulder, not elsewhere classified: Secondary | ICD-10-CM | POA: Diagnosis not present

## 2023-03-29 ENCOUNTER — Ambulatory Visit: Payer: No Typology Code available for payment source | Admitting: Internal Medicine

## 2023-03-29 ENCOUNTER — Encounter: Payer: Self-pay | Admitting: Internal Medicine

## 2023-03-29 VITALS — BP 121/61 | HR 94 | Temp 98.3°F | Ht 61.0 in | Wt 144.8 lb

## 2023-03-29 DIAGNOSIS — R7989 Other specified abnormal findings of blood chemistry: Secondary | ICD-10-CM | POA: Diagnosis not present

## 2023-03-29 DIAGNOSIS — Z2821 Immunization not carried out because of patient refusal: Secondary | ICD-10-CM

## 2023-03-29 DIAGNOSIS — S42354D Nondisplaced comminuted fracture of shaft of humerus, right arm, subsequent encounter for fracture with routine healing: Secondary | ICD-10-CM

## 2023-03-29 DIAGNOSIS — E7841 Elevated Lipoprotein(a): Secondary | ICD-10-CM | POA: Diagnosis not present

## 2023-03-29 DIAGNOSIS — S42351A Displaced comminuted fracture of shaft of humerus, right arm, initial encounter for closed fracture: Secondary | ICD-10-CM | POA: Insufficient documentation

## 2023-03-29 NOTE — Assessment & Plan Note (Signed)
Followed by hematology.  Most recently seen in July for follow-up.  No indication for additional phlebotomy.  She will return to care next month for follow-up.

## 2023-03-29 NOTE — Patient Instructions (Signed)
It was a pleasure to see you today.  Thank you for giving [place] the opportunity to be involved in your care.  Below is a brief recap of your visit and next steps.  We will plan to see you again in 6 months.  Summary No medication changes today We will plan for routine follow up in 6 months

## 2023-03-29 NOTE — Assessment & Plan Note (Signed)
Stephanie Ramos fell in the woods last month, landing on her right shoulder and suffering a comminuted fracture of the right proximal humerus.  She has established care with orthopedic surgery (Dr. Dion Saucier) and was placed in a sling for 6 weeks.  Reports that she was seen for follow-up last week.  Currently has limited range of motion in the right upper extremity and swelling in the right hand.  She is working with physical therapy.  Orthopedic surgery follow-up is scheduled for 11/18.

## 2023-03-29 NOTE — Assessment & Plan Note (Signed)
Lipid panel updated in April.  Total cholesterol 176, LDL 89, TGs 100.  She is currently prescribed simvastatin 40 mg daily, Zetia 10 mg daily, and Lovaza 1 g twice daily.  No medication changes are indicated at this time.

## 2023-03-29 NOTE — Progress Notes (Signed)
Established Patient Office Visit  Subjective   Patient ID: Stephanie Ramos, female    DOB: 05-05-1946  Age: 77 y.o. MRN: [id number]  Chief Complaint  Patient presents with   Medical Management of Chronic Issues   Stephanie Ramos returns to care today for routine follow-up.  She was last evaluated by me on 4/23.  No medication changes were made at that time and 72-month follow-up was arranged.  In the interim, she was seen by hematology for follow-up in July.  ED presentation on 9/12 in the setting of a right shoulder injury.  She suffered a right proximal humerus fracture and has established care with orthopedic surgery.  There have otherwise been no acute interval events.  Stephanie Ramos reports feeling fairly well today but endorses right upper extremity discomfort.  She states that the range of motion in her right shoulder and right hand is significantly reduced.  She wore a sling for 6 weeks and has recently started physical therapy.  She is concerned about limited range of motion and swelling, particularly in her hand.   Past Medical History:  Diagnosis Date   High iron content of liver determined by magnetic resonance imaging 08/02/2021   Hyperlipemia    Past Surgical History:  Procedure Laterality Date   BREAST SURGERY     CATARACT EXTRACTION W/PHACO Left 08/11/2019   Procedure: CATARACT EXTRACTION PHACO AND INTRAOCULAR LENS PLACEMENT (IOC) (CDE: 9.68);  Surgeon: [name], MD;  Location: [facility];  Service: Ophthalmology;  Laterality: Left;   CATARACT EXTRACTION W/PHACO Right 09/29/2019   Procedure: CATARACT EXTRACTION PHACO AND INTRAOCULAR LENS PLACEMENT RIGHT EYE;  Surgeon: [name], MD;  Location: [facility];  Service: Ophthalmology;  Laterality: Right;  CDE: 10.60   DILITATION & CURRETTAGE/HYSTROSCOPY WITH ESSURE     Social History   Tobacco Use   Smoking status: Never   Smokeless tobacco: Never  Substance Use Topics   Alcohol use: Never   Drug use: Never   History  reviewed. No pertinent family history. Allergies  Allergen Reactions   Sulfa Antibiotics Rash   Review of Systems  Musculoskeletal:  Positive for joint pain (Right shoulder).  All other systems reviewed and are negative.     Objective:     BP 121/61   Pulse 94   Temp 98.3 F (36.8 C)   Ht 5\' 1"  (1.549 m)   Wt 144 lb 12.8 oz (65.7 kg)   SpO2 94%   BMI 27.36 kg/m  BP Readings from Last 3 Encounters:  03/29/23 121/61  02/15/23 130/69  12/28/22 129/61   Physical Exam Vitals reviewed.  Constitutional:      General: She is not in acute distress.    Appearance: Normal appearance. She is not toxic-appearing.  HENT:     Head: Normocephalic and atraumatic.     Right Ear: External ear normal.     Left Ear: External ear normal.     Nose: Nose normal. No congestion or rhinorrhea.     Mouth/Throat:     Mouth: Mucous membranes are moist.     Pharynx: Oropharynx is clear. No oropharyngeal exudate or posterior oropharyngeal erythema.  Eyes:     General: No scleral icterus.    Extraocular Movements: Extraocular movements intact.     Conjunctiva/sclera: Conjunctivae normal.     Pupils: Pupils are equal, round, and reactive to light.  Cardiovascular:     Rate and Rhythm: Normal rate and regular rhythm.     Pulses: Normal pulses.  Heart sounds: Normal heart sounds. No murmur heard.    No friction rub. No gallop.  Pulmonary:     Effort: Pulmonary effort is normal.     Breath sounds: Normal breath sounds. No wheezing, rhonchi or rales.  Abdominal:     General: Abdomen is flat. Bowel sounds are normal. There is no distension.     Palpations: Abdomen is soft.     Tenderness: There is no abdominal tenderness.  Musculoskeletal:        General: Swelling (Right hand) present.     Cervical back: Normal range of motion.     Right lower leg: No edema.     Left lower leg: No edema.     Comments: Minimal ROM at the right shoulder, wrist, and hand  Lymphadenopathy:     Cervical: No  cervical adenopathy.  Skin:    General: Skin is warm and dry.     Capillary Refill: Capillary refill takes less than 2 seconds.     Coloration: Skin is not jaundiced.  Neurological:     General: No focal deficit present.     Mental Status: She is alert and oriented to person, place, and time.  Psychiatric:        Mood and Affect: Mood normal.        Behavior: Behavior normal.   Last CBC Lab Results  Component Value Date   WBC 5.8 12/22/2022   HGB 13.8 12/22/2022   HCT 43.4 12/22/2022   MCV 95.4 12/22/2022   MCH 30.3 12/22/2022   RDW 12.5 12/22/2022   PLT 345 12/22/2022   Last metabolic panel Lab Results  Component Value Date   GLUCOSE 97 01/23/2022   NA 141 01/23/2022   K 4.1 01/23/2022   CL 106 01/23/2022   CO2 29 01/23/2022   BUN 13 01/23/2022   CREATININE 0.60 01/23/2022   GFRNONAA >60 01/23/2022   CALCIUM 9.4 01/23/2022   PROT 7.2 12/22/2022   ALBUMIN 4.0 12/22/2022   LABGLOB 2.7 07/19/2021   AGRATIO 1.7 07/19/2021   BILITOT 0.6 12/22/2022   ALKPHOS 78 12/22/2022   AST 35 12/22/2022   ALT 40 12/22/2022   ANIONGAP 6 01/23/2022   Last lipids Lab Results  Component Value Date   CHOL 176 09/27/2022   HDL 69 09/27/2022   LDLCALC 89 09/27/2022   TRIG 100 09/27/2022   CHOLHDL 2.6 09/27/2022   Last hemoglobin A1c Lab Results  Component Value Date   HGBA1C 5.5 12/28/2017   Last thyroid functions Lab Results  Component Value Date   TSH 2.770 09/27/2022   Last vitamin D Lab Results  Component Value Date   VD25OH 38.10 01/23/2022   Last vitamin B12 and Folate Lab Results  Component Value Date   VITAMINB12 1,441 (H) 09/27/2022   FOLATE 15.3 09/27/2022   The 10-year ASCVD risk score (Arnett DK, et al., 2019) is: 16.2%    Assessment & Plan:   Problem List Items Addressed This Visit       Comminuted fracture of right humerus    Stephanie Ramos fell in the woods last month, landing on her right shoulder and suffering a comminuted fracture of the  right proximal humerus.  She has established care with orthopedic surgery (Dr. Dion Saucier) and was placed in a sling for 6 weeks.  Reports that she was seen for follow-up last week.  Currently has limited range of motion in the right upper extremity and swelling in the right hand.  She is working with physical  therapy.  Orthopedic surgery follow-up is scheduled for 11/18.      Hyperlipemia (Chronic)    Lipid panel updated in April.  Total cholesterol 176, LDL 89, TGs 100.  She is currently prescribed simvastatin 40 mg daily, Zetia 10 mg daily, and Lovaza 1 g twice daily.  No medication changes are indicated at this time.      Elevated ferritin level    Followed by hematology.  Most recently seen in July for follow-up.  No indication for additional phlebotomy.  She will return to care next month for follow-up.      Return in about 6 months (around 09/27/2023).   Billie Lade, MD

## 2023-04-02 ENCOUNTER — Other Ambulatory Visit: Payer: Self-pay | Admitting: Internal Medicine

## 2023-04-02 DIAGNOSIS — M6281 Muscle weakness (generalized): Secondary | ICD-10-CM | POA: Diagnosis not present

## 2023-04-02 DIAGNOSIS — S42294D Other nondisplaced fracture of upper end of right humerus, subsequent encounter for fracture with routine healing: Secondary | ICD-10-CM | POA: Diagnosis not present

## 2023-04-02 DIAGNOSIS — M25611 Stiffness of right shoulder, not elsewhere classified: Secondary | ICD-10-CM | POA: Diagnosis not present

## 2023-04-02 DIAGNOSIS — E7841 Elevated Lipoprotein(a): Secondary | ICD-10-CM

## 2023-04-04 DIAGNOSIS — S42294D Other nondisplaced fracture of upper end of right humerus, subsequent encounter for fracture with routine healing: Secondary | ICD-10-CM | POA: Diagnosis not present

## 2023-04-04 DIAGNOSIS — M6281 Muscle weakness (generalized): Secondary | ICD-10-CM | POA: Diagnosis not present

## 2023-04-04 DIAGNOSIS — M25611 Stiffness of right shoulder, not elsewhere classified: Secondary | ICD-10-CM | POA: Diagnosis not present

## 2023-04-09 DIAGNOSIS — M6281 Muscle weakness (generalized): Secondary | ICD-10-CM | POA: Diagnosis not present

## 2023-04-09 DIAGNOSIS — S42294A Other nondisplaced fracture of upper end of right humerus, initial encounter for closed fracture: Secondary | ICD-10-CM | POA: Diagnosis not present

## 2023-04-09 DIAGNOSIS — S42294D Other nondisplaced fracture of upper end of right humerus, subsequent encounter for fracture with routine healing: Secondary | ICD-10-CM | POA: Diagnosis not present

## 2023-04-09 DIAGNOSIS — M25611 Stiffness of right shoulder, not elsewhere classified: Secondary | ICD-10-CM | POA: Diagnosis not present

## 2023-04-10 ENCOUNTER — Encounter (HOSPITAL_COMMUNITY): Payer: Self-pay | Admitting: Occupational Therapy

## 2023-04-10 ENCOUNTER — Other Ambulatory Visit: Payer: Self-pay

## 2023-04-10 ENCOUNTER — Ambulatory Visit (HOSPITAL_COMMUNITY): Payer: No Typology Code available for payment source | Attending: Orthopedic Surgery | Admitting: Occupational Therapy

## 2023-04-10 DIAGNOSIS — M25641 Stiffness of right hand, not elsewhere classified: Secondary | ICD-10-CM | POA: Insufficient documentation

## 2023-04-10 DIAGNOSIS — R29898 Other symptoms and signs involving the musculoskeletal system: Secondary | ICD-10-CM | POA: Insufficient documentation

## 2023-04-10 DIAGNOSIS — M25611 Stiffness of right shoulder, not elsewhere classified: Secondary | ICD-10-CM | POA: Insufficient documentation

## 2023-04-10 DIAGNOSIS — M25621 Stiffness of right elbow, not elsewhere classified: Secondary | ICD-10-CM | POA: Insufficient documentation

## 2023-04-10 DIAGNOSIS — M25511 Pain in right shoulder: Secondary | ICD-10-CM | POA: Insufficient documentation

## 2023-04-10 NOTE — Therapy (Signed)
OUTPATIENT OCCUPATIONAL THERAPY ORTHO EVALUATION  Patient Name: Stephanie Ramos MRN: [id number] DOB:1946/04/12, 77 y.o., female Today's Date: 04/10/2023    END OF SESSION:  OT End of Session - 04/10/23 1351     Visit Number 1    Number of Visits 24    Date for OT Re-Evaluation 06/09/23   mini-reassessment 05/09/23   Authorization Type [employer], copay: $15    Authorization Time Period no auth required    OT Start Time 1108    OT Stop Time 1140    OT Time Calculation (min) 32 min    Activity Tolerance Patient tolerated treatment well    Behavior During Therapy WFL for tasks assessed/performed             Past Medical History:  Diagnosis Date   High iron content of liver determined by magnetic resonance imaging 08/02/2021   Hyperlipemia    Past Surgical History:  Procedure Laterality Date   BREAST SURGERY     CATARACT EXTRACTION W/PHACO Left 08/11/2019   Procedure: CATARACT EXTRACTION PHACO AND INTRAOCULAR LENS PLACEMENT (IOC) (CDE: 9.68);  Surgeon: [name], MD;  Location: [facility];  Service: Ophthalmology;  Laterality: Left;   CATARACT EXTRACTION W/PHACO Right 09/29/2019   Procedure: CATARACT EXTRACTION PHACO AND INTRAOCULAR LENS PLACEMENT RIGHT EYE;  Surgeon: [name], MD;  Location: [facility];  Service: Ophthalmology;  Laterality: Right;  CDE: 10.60   DILITATION & CURRETTAGE/HYSTROSCOPY WITH ESSURE     Patient Active Problem List   Diagnosis Date Noted   Comminuted fracture of right humerus 03/29/2023   Preventative health care 03/10/2022   Hypercalcemia 09/08/2021   High iron content of liver determined by magnetic resonance imaging 08/02/2021   Hyperkalemia 07/21/2021   Elevated ferritin level 06/24/2021   Fatigue 05/20/2021   Iron deficiency 01/18/2021   Elevated BP without diagnosis of hypertension 01/18/2021   Overweight with body mass index (BMI) of 26 to 26.9 in adult 03/18/2020   Refused influenza vaccine 03/18/2020   Vertigo 12/28/2017    Hyperlipemia 12/28/2017   PCP: Dr. Christel Mormon REFERRING PROVIDER: Dr. Teryl Lucy  ONSET DATE: 02/15/23  REFERRING DIAG: rt prox humerus fx with regional pain syndrome of right upper etremity   THERAPY DIAG:  Acute pain of right shoulder  Stiffness of right shoulder, not elsewhere classified  Stiffness of right elbow, not elsewhere classified  Stiffness of right hand, not elsewhere classified  Other symptoms and signs involving the musculoskeletal system  Rationale for Evaluation and Treatment: Rehabilitation  SUBJECTIVE:   SUBJECTIVE STATEMENT: S: That's all I do is play with my fingers.  Pt accompanied by: self  PERTINENT HISTORY: Pt is a 77 y/o female presenting s/p right proximal humerus fracture after falling when [place] up in the woods on 02/15/23. Pt attended therapy at [facility] approximately 4 times before transferring to this clinic.   PRECAUTIONS: Shoulder  WEIGHT BEARING RESTRICTIONS: Yes NWB  PAIN:  Are you having pain? No  FALLS: Has patient fallen in last 6 months? Yes. Number of falls 1  PLOF: Independent  PATIENT GOALS: To get the right arm moving again.   NEXT MD VISIT: 05/07/23  OBJECTIVE:  Note: Objective measures were completed at Evaluation unless otherwise noted.  HAND DOMINANCE: Right  ADLs: Overall ADLs: Pt is unable to use the RUE for ADLs. She can hold paper plate between her fingers. She is unable to make a fist or use her hand and arm. Pt is using the LUE for all tasks, using  hemi-dressing techniques and strategies for independence.    FUNCTIONAL OUTCOME MEASURES: Quick Dash: 72.73  UPPER EXTREMITY ROM:       Assessed in sitting, er/IR adducted  Passive ROM Right eval  Shoulder flexion 61  Shoulder abduction 54  Shoulder internal rotation 90  Shoulder external rotation 19  Elbow flexion 136  Elbow extension -30  Wrist flexion 55  Wrist extension 8  Wrist ulnar deviation 25  Wrist radial deviation 8  Wrist  pronation 85  Wrist supination 85  (Blank rows = not tested)   Assessed in sitting, er/IR adducted  Active ROM Right eval  Shoulder flexion 33  Shoulder abduction 41  Shoulder internal rotation 90  Shoulder external rotation 20  Elbow flexion 128  Elbow extension -45  Wrist flexion 55  Wrist extension 8  Wrist ulnar deviation 25  Wrist radial deviation 4  Wrist pronation 70  Wrist supination 80  (Blank rows = not tested)   UPPER EXTREMITY MMT:       Unable to assess due to pain, limited active movement  MMT Right eval  Shoulder flexion   Shoulder abduction   Shoulder internal rotation   Shoulder external rotation   Elbow flexion   Elbow extension   Wrist flexion   Wrist extension   Wrist ulnar deviation   Wrist radial deviation   Wrist pronation   Wrist supination   (Blank rows = not tested)  HAND FUNCTION: Unable, TBD   COORDINATION: Unable, TBD  SENSATION: WFL  EDEMA:  -Right  -Left Upper arm    32cm 30.5cm Elbow           29cm 24cm Mid-forearm 20cm 20.5cm Wrist            17cm 14.5cm Palm            18.5cm 17cm  COGNITION: Overall cognitive status: Within functional limits for tasks assessed  OBSERVATIONS: pt with generalized edema throughout RUE; joint stiffness in digits, wrist, elbow, and shoulder. Consistent with possibility of developing CRPS noting dx of regional pain syndrome of RUE   TODAY'S TREATMENT:                                                                                                                              DATE:  N/A-eval only     PATIENT EDUCATION: Education details: Provided shoulder length edema glove/sleeve, educated on donning/doffing, wear and care Person educated: Patient Education method: Explanation, Demonstration, Tactile cues, and Verbal cues Education comprehension: verbalized understanding and returned demonstration  HOME EXERCISE PROGRAM: Eval: Provided shoulder length edema glove/sleeve,  educated on donning/doffing, wear and care; continue table slides from [facility] therapist  GOALS: Goals reviewed with patient? Yes  SHORT TERM GOALS: Target date: 05/09/23  Pt will be provided with and educated on HEP to improve RUE functioning required for use during ADLs.   Goal status: INITIAL  2.  Pt will increase P/ROM of right shoulder by  50+ degrees to improve ability to perform dressing and bathing tasks independently.   Goal status: INITIAL  3.  Pt will increase A/ROM of right elbow to full extension to improve ability to perform low level functional reaching tasks.   Goal status: INITIAL  4.  Pt will decrease edema in RUE by 50% or greater to improve mobility in RUE required for active participation in bathing and grooming tasks.   Goal status: INITIAL  5.  Pt will demonstrate ability to make at least 50% of a grasp to promote use when grasping large items such as a cup or water bottle.   Goal status: INITIAL   LONG TERM GOALS: Target date: 06/09/23  Pt will decrease pain in the RUE to 2/10 or less to improve ability to sleep for 3+ consecutive hours without waking due to pain.   Goal status: INITIAL  2.  Pt will increase RUE A/ROM to at least 120 degrees shoulder flexion/abduction, and wrist extension by 25+ degrees to improve use of RUE during meal preparation tasks.   Goal status: INITIAL  3.  Pt will increase RUE strength to 4-/5 or greater to improve ability to use RUE during housework tasks.   Goal status: INITIAL  4.  Pt will increase grip strength to at least 20# and pinch strength to at least 5# to improve ability to grasp and carry groceries.  Baseline:  Goal status: INITIAL  5.  Pt will decrease RUE fascial restrictions to min amounts or less to improve ability to perform functional reaching tasks.   Goal status: INITIAL   ASSESSMENT:  CLINICAL IMPRESSION: Patient is a 77 y.o. female who was seen today for occupational therapy evaluation  for right proximal humerus fracture with regional pain syndrome of RUE. Pt has received 4 therapy visits at [facility] and is transferring to this clinic near her home. Pt with very limited functional use of her RUE due to edema and stiffness throughout the RUE and all joints consistent with possible CRPS, however pain level is low. Pt is right dominant and is using her LUE to perform all ADLs and functional tasks.    PERFORMANCE DEFICITS: in functional skills including ADLs, IADLs, coordination, dexterity, proprioception, edema, ROM, strength, pain, fascial restrictions, Fine motor control, Gross motor control, and UE functional use  IMPAIRMENTS: are limiting patient from ADLs, IADLs, rest and sleep, work, leisure, and social participation.   COMORBIDITIES: has no other co-morbidities that affects occupational performance. Patient will benefit from skilled OT to address above impairments and improve overall function.  MODIFICATION OR ASSISTANCE TO COMPLETE EVALUATION: Min-Moderate modification of tasks or assist with assess necessary to complete an evaluation.  OT OCCUPATIONAL PROFILE AND HISTORY: Problem focused assessment: Including review of records relating to presenting problem.  CLINICAL DECISION MAKING: Moderate - several treatment options, min-mod task modification necessary  REHAB POTENTIAL: Good  EVALUATION COMPLEXITY: Moderate      PLAN:  OT FREQUENCY: 3x/week  OT DURATION: 8 weeks  PLANNED INTERVENTIONS: 97168 OT Re-evaluation, 97535 self care/ADL training, 16109 therapeutic exercise, 97530 therapeutic activity, 97140 manual therapy, 97035 ultrasound, 97018 paraffin, 60454 moist heat, 97010 cryotherapy, 97032 electrical stimulation (manual), 97014 electrical stimulation unattended, 97760 Splinting (initial encounter), and DME and/or AE instructions  RECOMMENDED OTHER SERVICES: None at this time  CONSULTED AND AGREED WITH PLAN OF CARE: Patient  PLAN FOR NEXT SESSION:  Follow up on edema sleeve wear. Initiate retrograde massage and manual techniques, passive stretching progressing to AA/ROM as able to tolerate. Update HEP  Stephanie Ramos, OTR/L  [phone] 04/10/2023, 1:53 PM

## 2023-04-13 ENCOUNTER — Ambulatory Visit (HOSPITAL_COMMUNITY): Payer: No Typology Code available for payment source | Admitting: Occupational Therapy

## 2023-04-13 ENCOUNTER — Encounter (HOSPITAL_COMMUNITY): Payer: Self-pay | Admitting: Occupational Therapy

## 2023-04-13 DIAGNOSIS — M25511 Pain in right shoulder: Secondary | ICD-10-CM | POA: Diagnosis not present

## 2023-04-13 DIAGNOSIS — M25611 Stiffness of right shoulder, not elsewhere classified: Secondary | ICD-10-CM

## 2023-04-13 DIAGNOSIS — M25641 Stiffness of right hand, not elsewhere classified: Secondary | ICD-10-CM

## 2023-04-13 DIAGNOSIS — M25621 Stiffness of right elbow, not elsewhere classified: Secondary | ICD-10-CM

## 2023-04-13 NOTE — Therapy (Signed)
OUTPATIENT OCCUPATIONAL THERAPY ORTHO TREATMENT  Patient Name: Stephanie Ramos MRN: [id number] DOB:08-04-1945, 77 y.o., female Today's Date: 04/13/2023    END OF SESSION:  OT End of Session - 04/13/23 1349     Visit Number 2    Number of Visits 24    Date for OT Re-Evaluation 06/09/23    Authorization Type [employer], copay: $15    Authorization Time Period no auth required    OT Start Time 1300    OT Stop Time 1345    OT Time Calculation (min) 45 min    Activity Tolerance Patient tolerated treatment well    Behavior During Therapy WFL for tasks assessed/performed              Past Medical History:  Diagnosis Date   High iron content of liver determined by magnetic resonance imaging 08/02/2021   Hyperlipemia    Past Surgical History:  Procedure Laterality Date   BREAST SURGERY     CATARACT EXTRACTION W/PHACO Left 08/11/2019   Procedure: CATARACT EXTRACTION PHACO AND INTRAOCULAR LENS PLACEMENT (IOC) (CDE: 9.68);  Surgeon: [name], MD;  Location: [facility];  Service: Ophthalmology;  Laterality: Left;   CATARACT EXTRACTION W/PHACO Right 09/29/2019   Procedure: CATARACT EXTRACTION PHACO AND INTRAOCULAR LENS PLACEMENT RIGHT EYE;  Surgeon: [name], MD;  Location: [facility];  Service: Ophthalmology;  Laterality: Right;  CDE: 10.60   DILITATION & CURRETTAGE/HYSTROSCOPY WITH ESSURE     Patient Active Problem List   Diagnosis Date Noted   Comminuted fracture of right humerus 03/29/2023   Preventative health care 03/10/2022   Hypercalcemia 09/08/2021   High iron content of liver determined by magnetic resonance imaging 08/02/2021   Hyperkalemia 07/21/2021   Elevated ferritin level 06/24/2021   Fatigue 05/20/2021   Iron deficiency 01/18/2021   Elevated BP without diagnosis of hypertension 01/18/2021   Overweight with body mass index (BMI) of 26 to 26.9 in adult 03/18/2020   Refused influenza vaccine 03/18/2020   Vertigo 12/28/2017   Hyperlipemia 12/28/2017    PCP: Dr. Christel Mormon REFERRING PROVIDER: Dr. Teryl Lucy  ONSET DATE: 02/15/23  REFERRING DIAG: rt prox humerus fx with regional pain syndrome of right upper etremity   THERAPY DIAG:  Acute pain of right shoulder  Stiffness of right shoulder, not elsewhere classified  Stiffness of right elbow, not elsewhere classified  Stiffness of right hand, not elsewhere classified  Rationale for Evaluation and Treatment: Rehabilitation  SUBJECTIVE:   SUBJECTIVE STATEMENT: S: I have been working my arm.  Pt accompanied by: self  PERTINENT HISTORY: Pt is a 77 y/o female presenting s/p right proximal humerus fracture after falling when cleaning up in the woods on 02/15/23. Pt attended therapy at [facility] approximately 4 times before transferring to this clinic.   PRECAUTIONS: Shoulder  WEIGHT BEARING RESTRICTIONS: Yes NWB  PAIN:  Are you having pain? No  FALLS: Has patient fallen in last 6 months? Yes. Number of falls 1  PLOF: Independent  PATIENT GOALS: To get the right arm moving again.   NEXT MD VISIT: 05/07/23  OBJECTIVE:  Note: Objective measures were completed at Evaluation unless otherwise noted.  HAND DOMINANCE: Right  ADLs: Overall ADLs: Pt is unable to use the RUE for ADLs. She can hold paper plate between her fingers. She is unable to make a fist or use her hand and arm. Pt is using the LUE for all tasks, using hemi-dressing techniques and strategies for independence.    FUNCTIONAL OUTCOME MEASURES: Quick Dash:  72.73  UPPER EXTREMITY ROM:       Assessed in sitting, er/IR adducted  Passive ROM Right eval  Shoulder flexion 61  Shoulder abduction 54  Shoulder internal rotation 90  Shoulder external rotation 19  Elbow flexion 136  Elbow extension -30  Wrist flexion 55  Wrist extension 8  Wrist ulnar deviation 25  Wrist radial deviation 8  Wrist pronation 85  Wrist supination 85  (Blank rows = not tested)   Assessed in sitting, er/IR  adducted  Active ROM Right eval  Shoulder flexion 33  Shoulder abduction 41  Shoulder internal rotation 90  Shoulder external rotation 20  Elbow flexion 128  Elbow extension -45  Wrist flexion 55  Wrist extension 8  Wrist ulnar deviation 25  Wrist radial deviation 4  Wrist pronation 70  Wrist supination 80  (Blank rows = not tested)   UPPER EXTREMITY MMT:       Unable to assess due to pain, limited active movement  MMT Right eval  Shoulder flexion   Shoulder abduction   Shoulder internal rotation   Shoulder external rotation   Elbow flexion   Elbow extension   Wrist flexion   Wrist extension   Wrist ulnar deviation   Wrist radial deviation   Wrist pronation   Wrist supination   (Blank rows = not tested)  HAND FUNCTION: Unable, TBD   COORDINATION: Unable, TBD  SENSATION: WFL  EDEMA:  -Right  -Left Upper arm    32cm 30.5cm Elbow           29cm 24cm Mid-forearm 20cm 20.5cm Wrist            17cm 14.5cm Palm            18.5cm 17cm  COGNITION: Overall cognitive status: Within functional limits for tasks assessed  OBSERVATIONS: pt with generalized edema throughout RUE; joint stiffness in digits, wrist, elbow, and shoulder. Consistent with possibility of developing CRPS noting dx of regional pain syndrome of RUE   TODAY'S TREATMENT:                                                                                                                              DATE:   04/13/23 - Manual: to entire RUE (fingers to shoulder) retrograde massage to decrease stiffness and increase ROM  -P/ROM: supine; flexion, abduction, elbow flexion and extension, wrist flexion and extension, composite digit flexion  -AA/ROM: supine; 25% shoulder flexion, abduction, bench press to extend elbows    PATIENT EDUCATION: Education details: Provided shoulder length edema glove/sleeve, educated on donning/doffing, wear and care Person educated: Patient Education method: Explanation,  Demonstration, Tactile cues, and Verbal cues Education comprehension: verbalized understanding and returned demonstration  HOME EXERCISE PROGRAM: Eval: Provided shoulder length edema glove/sleeve, educated on donning/doffing, wear and care; continue table slides from [facility] therapist  GOALS: Goals reviewed with patient? Yes  SHORT TERM GOALS: Target date: 05/09/23  Pt will be provided with  and educated on HEP to improve RUE functioning required for use during ADLs.   Goal status: INITIAL  2.  Pt will increase P/ROM of right shoulder by 50+ degrees to improve ability to perform dressing and bathing tasks independently.   Goal status: INITIAL  3.  Pt will increase A/ROM of right elbow to full extension to improve ability to perform low level functional reaching tasks.   Goal status: INITIAL  4.  Pt will decrease edema in RUE by 50% or greater to improve mobility in RUE required for active participation in bathing and grooming tasks.   Goal status: INITIAL  5.  Pt will demonstrate ability to make at least 50% of a grasp to promote use when grasping large items such as a cup or water bottle.   Goal status: INITIAL   LONG TERM GOALS: Target date: 06/09/23  Pt will decrease pain in the RUE to 2/10 or less to improve ability to sleep for 3+ consecutive hours without waking due to pain.   Goal status: INITIAL  2.  Pt will increase RUE A/ROM to at least 120 degrees shoulder flexion/abduction, and wrist extension by 25+ degrees to improve use of RUE during meal preparation tasks.   Goal status: INITIAL  3.  Pt will increase RUE strength to 4-/5 or greater to improve ability to use RUE during housework tasks.   Goal status: INITIAL  4.  Pt will increase grip strength to at least 20# and pinch strength to at least 5# to improve ability to grasp and carry groceries.  Baseline:  Goal status: INITIAL  5.  Pt will decrease RUE fascial restrictions to min amounts or less to  improve ability to perform functional reaching tasks.   Goal status: INITIAL   ASSESSMENT:  CLINICAL IMPRESSION: Patient comes to session with stiffness in RUE. Began session with retrograde massage to entire RUE. Initiated passive stretching for shoulder, elbow, wrist and digits. Pt was able to achieve 25% AA/ROM in supine position. Frequent rest breaks given throughout session due to pain and fatigue.   PERFORMANCE DEFICITS: in functional skills including ADLs, IADLs, coordination, dexterity, proprioception, edema, ROM, strength, pain, fascial restrictions, Fine motor control, Gross motor control, and UE functional use  IMPAIRMENTS: are limiting patient from ADLs, IADLs, rest and sleep, work, leisure, and social participation.   COMORBIDITIES: has no other co-morbidities that affects occupational performance. Patient will benefit from skilled OT to address above impairments and improve overall function.  MODIFICATION OR ASSISTANCE TO COMPLETE EVALUATION: Min-Moderate modification of tasks or assist with assess necessary to complete an evaluation.  OT OCCUPATIONAL PROFILE AND HISTORY: Problem focused assessment: Including review of records relating to presenting problem.  CLINICAL DECISION MAKING: Moderate - several treatment options, min-mod task modification necessary  REHAB POTENTIAL: Good  EVALUATION COMPLEXITY: Moderate      PLAN:  OT FREQUENCY: 3x/week  OT DURATION: 8 weeks  PLANNED INTERVENTIONS: 97168 OT Re-evaluation, 97535 self care/ADL training, 16109 therapeutic exercise, 97530 therapeutic activity, 97140 manual therapy, 97035 ultrasound, 97018 paraffin, 60454 moist heat, 97010 cryotherapy, 97032 electrical stimulation (manual), 97014 electrical stimulation unattended, 97760 Splinting (initial encounter), and DME and/or AE instructions  RECOMMENDED OTHER SERVICES: None at this time  CONSULTED AND AGREED WITH PLAN OF CARE: Patient  PLAN FOR NEXT SESSION: Follow  up on edema sleeve wear. Initiate retrograde massage and manual techniques, passive stretching progressing to AA/ROM as able to tolerate. Update HEP   Lurena Joiner, OTR/L  [phone] 04/13/2023, 1:50 PM

## 2023-04-13 NOTE — Patient Instructions (Signed)
Perform each exercise ____10-15____ reps. 2-3x days.   1) Protraction   Start by holding a wand or cane at chest height.  Next, slowly push the wand outwards in front of your body so that your elbows become fully straightened. Then, return to the original position.     2) Shoulder FLEXION   In the standing position, hold a wand/cane with both arms, palms down on both sides. Raise up the wand/cane allowing your unaffected arm to perform most of the effort. Your affected arm should be partially relaxed.         4) Shoulder ABDUCTION   While holding a wand/cane palm face up on the injured side and palm face down on the uninjured side, slowly raise up your injured arm to the side.        5) Horizontal Abduction/Adduction      Straight arms holding cane at shoulder height, bring cane to right, center, left. Repeat starting to left.   Copyright  [employer]. All rights reserved.

## 2023-04-16 ENCOUNTER — Encounter (HOSPITAL_COMMUNITY): Payer: Self-pay | Admitting: Occupational Therapy

## 2023-04-16 ENCOUNTER — Ambulatory Visit (HOSPITAL_COMMUNITY): Payer: No Typology Code available for payment source | Admitting: Occupational Therapy

## 2023-04-16 DIAGNOSIS — R29898 Other symptoms and signs involving the musculoskeletal system: Secondary | ICD-10-CM

## 2023-04-16 DIAGNOSIS — M25611 Stiffness of right shoulder, not elsewhere classified: Secondary | ICD-10-CM

## 2023-04-16 DIAGNOSIS — M25641 Stiffness of right hand, not elsewhere classified: Secondary | ICD-10-CM

## 2023-04-16 DIAGNOSIS — M25621 Stiffness of right elbow, not elsewhere classified: Secondary | ICD-10-CM

## 2023-04-16 DIAGNOSIS — M25511 Pain in right shoulder: Secondary | ICD-10-CM | POA: Diagnosis not present

## 2023-04-16 NOTE — Therapy (Signed)
OUTPATIENT OCCUPATIONAL THERAPY ORTHO TREATMENT  Patient Name: Stephanie Ramos MRN: [id number] DOB:Mar 13, 1946, 77 y.o., female Today's Date: 04/16/2023    END OF SESSION:  OT End of Session - 04/16/23 1011     Visit Number 3    Number of Visits 24    Date for OT Re-Evaluation 06/09/23    Authorization Type [employer], copay: $15    Authorization Time Period no auth required    OT Start Time 0932    OT Stop Time 1012    OT Time Calculation (min) 40 min    Activity Tolerance Patient tolerated treatment well    Behavior During Therapy [facility] for tasks assessed/performed               Past Medical History:  Diagnosis Date   High iron content of liver determined by magnetic resonance imaging 08/02/2021   Hyperlipemia    Past Surgical History:  Procedure Laterality Date   BREAST SURGERY     CATARACT EXTRACTION W/PHACO Left 08/11/2019   Procedure: CATARACT EXTRACTION PHACO AND INTRAOCULAR LENS PLACEMENT (IOC) (CDE: 9.68);  Surgeon: [name], MD;  Location: [facility];  Service: Ophthalmology;  Laterality: Left;   CATARACT EXTRACTION W/PHACO Right 09/29/2019   Procedure: CATARACT EXTRACTION PHACO AND INTRAOCULAR LENS PLACEMENT RIGHT EYE;  Surgeon: [name], MD;  Location: [facility];  Service: Ophthalmology;  Laterality: Right;  CDE: 10.60   DILITATION & CURRETTAGE/HYSTROSCOPY WITH ESSURE     Patient Active Problem List   Diagnosis Date Noted   Comminuted fracture of right humerus 03/29/2023   Preventative health care 03/10/2022   Hypercalcemia 09/08/2021   High iron content of liver determined by magnetic resonance imaging 08/02/2021   Hyperkalemia 07/21/2021   Elevated ferritin level 06/24/2021   Fatigue 05/20/2021   Iron deficiency 01/18/2021   Elevated BP without diagnosis of hypertension 01/18/2021   Overweight with body mass index (BMI) of 26 to 26.9 in adult 03/18/2020   Refused influenza vaccine 03/18/2020   Vertigo 12/28/2017   Hyperlipemia 12/28/2017    PCP: Dr. Christel Mormon REFERRING PROVIDER: Dr. Teryl Lucy  ONSET DATE: 02/15/23  REFERRING DIAG: rt prox humerus fx with regional pain syndrome of right upper etremity   THERAPY DIAG:  Acute pain of right shoulder  Stiffness of right shoulder, not elsewhere classified  Stiffness of right elbow, not elsewhere classified  Stiffness of right hand, not elsewhere classified  Other symptoms and signs involving the musculoskeletal system  Rationale for Evaluation and Treatment: Rehabilitation  SUBJECTIVE:   SUBJECTIVE STATEMENT: S: I've been working and working this arm.   PERTINENT HISTORY: Pt is a 77 y/o female presenting s/p right proximal humerus fracture after falling when cleaning up in the woods on 02/15/23. Pt attended therapy at [facility] approximately 4 times before transferring to this clinic.   PRECAUTIONS: Shoulder  WEIGHT BEARING RESTRICTIONS: Yes NWB  PAIN:  Are you having pain? Yes: NPRS scale: 2/10 Pain location: right shoulder Pain description: sore Aggravating factors: bra strap Relieving factors: moving strap  FALLS: Has patient fallen in last 6 months? Yes. Number of falls 1  PLOF: Independent  PATIENT GOALS: To get the right arm moving again.   NEXT MD VISIT: 05/07/23  OBJECTIVE:  Note: Objective measures were completed at Evaluation unless otherwise noted.  HAND DOMINANCE: Right  ADLs: Overall ADLs: Pt is unable to use the RUE for ADLs. She can hold paper plate between her fingers. She is unable to make a fist or use her hand  and arm. Pt is using the LUE for all tasks, using hemi-dressing techniques and strategies for independence.    FUNCTIONAL OUTCOME MEASURES: Quick Dash: 72.73  UPPER EXTREMITY ROM:       Assessed in sitting, er/IR adducted  Passive ROM Right eval  Shoulder flexion 61  Shoulder abduction 54  Shoulder internal rotation 90  Shoulder external rotation 19  Elbow flexion 136  Elbow extension -30  Wrist  flexion 55  Wrist extension 8  Wrist ulnar deviation 25  Wrist radial deviation 8  Wrist pronation 85  Wrist supination 85  (Blank rows = not tested)   Assessed in sitting, er/IR adducted  Active ROM Right eval  Shoulder flexion 33  Shoulder abduction 41  Shoulder internal rotation 90  Shoulder external rotation 20  Elbow flexion 128  Elbow extension -45  Wrist flexion 55  Wrist extension 8  Wrist ulnar deviation 25  Wrist radial deviation 4  Wrist pronation 70  Wrist supination 80  (Blank rows = not tested)   UPPER EXTREMITY MMT:       Unable to assess due to pain, limited active movement  MMT Right eval  Shoulder flexion   Shoulder abduction   Shoulder internal rotation   Shoulder external rotation   Elbow flexion   Elbow extension   Wrist flexion   Wrist extension   Wrist ulnar deviation   Wrist radial deviation   Wrist pronation   Wrist supination   (Blank rows = not tested)  HAND FUNCTION: Unable, TBD   COORDINATION: Unable, TBD  SENSATION: WFL  EDEMA:  -Right  -Left Upper arm    32cm 30.5cm Elbow           29cm 24cm Mid-forearm 20cm 20.5cm Wrist            17cm 14.5cm Palm            18.5cm 17cm  OBSERVATIONS: pt with generalized edema throughout RUE; joint stiffness in digits, wrist, elbow, and shoulder. Consistent with possibility of developing CRPS noting dx of regional pain syndrome of RUE   TODAY'S TREATMENT:                                                                                                                              DATE:  04/16/23 -Manual: retrograde massage and myofascial release to entire RUE (fingers to shoulder) to decrease stiffness and increase joint ROM in RUE -P/ROM: supine- shoulder flexion, abduction, er; elbow flexion and extension, wrist flexion and extension -AA/ROM: supine-40% shoulder flexion, abduction, protraction, er, 10 reps -Scapular A/ROM: extension, row, 10 reps  04/13/23 - Manual: to entire  RUE (fingers to shoulder) retrograde massage to decrease stiffness and increase ROM  -P/ROM: supine; flexion, abduction, elbow flexion and extension, wrist flexion and extension, composite digit flexion  -AA/ROM: supine; 25% shoulder flexion, abduction, bench press to extend elbows    PATIENT EDUCATION: Education details: reviewed HEP Person educated: Patient Education method:  Explanation, Demonstration, Tactile cues, and Verbal cues Education comprehension: verbalized understanding and returned demonstration  HOME EXERCISE PROGRAM: Eval: Provided shoulder length edema glove/sleeve, educated on donning/doffing, wear and care; continue table slides from [facility] therapist 11/8: AA/ROM in supine  GOALS: Goals reviewed with patient? Yes  SHORT TERM GOALS: Target date: 05/09/23  Pt will be provided with and educated on HEP to improve RUE functioning required for use during ADLs.   Goal status: IN PROGRESS  2.  Pt will increase P/ROM of right shoulder by 50+ degrees to improve ability to perform dressing and bathing tasks independently.   Goal status: IN PROGRESS  3.  Pt will increase A/ROM of right elbow to full extension to improve ability to perform low level functional reaching tasks.   Goal status: IN PROGRESS  4.  Pt will decrease edema in RUE by 50% or greater to improve mobility in RUE required for active participation in bathing and grooming tasks.   Goal status: IN PROGRESS  5.  Pt will demonstrate ability to make at least 50% of a grasp to promote use when grasping large items such as a cup or water bottle.   Goal status: IN PROGRESS   LONG TERM GOALS: Target date: 06/09/23  Pt will decrease pain in the RUE to 2/10 or less to improve ability to sleep for 3+ consecutive hours without waking due to pain.   Goal status: IN PROGRESS  2.  Pt will increase RUE A/ROM to at least 120 degrees shoulder flexion/abduction, and wrist extension by 25+ degrees to improve use  of RUE during meal preparation tasks.   Goal status: IN PROGRESS  3.  Pt will increase RUE strength to 4-/5 or greater to improve ability to use RUE during housework tasks.   Goal status: IN PROGRESS  4.  Pt will increase grip strength to at least 20# and pinch strength to at least 5# to improve ability to grasp and carry groceries.  Baseline:  Goal status: IN PROGRESS  5.  Pt will decrease RUE fascial restrictions to min amounts or less to improve ability to perform functional reaching tasks.   Goal status: IN PROGRESS   ASSESSMENT:  CLINICAL IMPRESSION: Patient reports she has been completing her HEP 3x/day and is rubbing her arm and shoulder throughout the day. Continued with manual techniques to RUE, pt with max restriction/tenderness at anterior shoulder/upper arm and in scapular region. Pt with mod guarding limiting success with P/ROM. Pt with greater ROM gains during AA/ROM achieving ROM to approximately 40% for flexion. Added scapular A/ROM to target improved mobility. Consistent verbal and visual cuing for form and technique during exercises.   PERFORMANCE DEFICITS: in functional skills including ADLs, IADLs, coordination, dexterity, proprioception, edema, ROM, strength, pain, fascial restrictions, Fine motor control, Gross motor control, and UE functional use    PLAN:  OT FREQUENCY: 3x/week  OT DURATION: 8 weeks  PLANNED INTERVENTIONS: 97168 OT Re-evaluation, 97535 self care/ADL training, 69629 therapeutic exercise, 97530 therapeutic activity, 97140 manual therapy, 97035 ultrasound, 97018 paraffin, 52841 moist heat, 97010 cryotherapy, 97032 electrical stimulation (manual), 97014 electrical stimulation unattended, 97760 Splinting (initial encounter), and DME and/or AE instructions  CONSULTED AND AGREED WITH PLAN OF CARE: Patient  PLAN FOR NEXT SESSION: retrograde massage and manual techniques, passive stretching progressing to AA/ROM as able to tolerate. Update HEP for  scapular A/ROM  Ezra Sites, OTR/L  [phone] 04/16/2023, 10:13 AM

## 2023-04-18 ENCOUNTER — Ambulatory Visit (HOSPITAL_COMMUNITY): Payer: No Typology Code available for payment source | Admitting: Occupational Therapy

## 2023-04-18 ENCOUNTER — Encounter (HOSPITAL_COMMUNITY): Payer: Self-pay | Admitting: Occupational Therapy

## 2023-04-18 DIAGNOSIS — M25511 Pain in right shoulder: Secondary | ICD-10-CM | POA: Diagnosis not present

## 2023-04-18 DIAGNOSIS — M25621 Stiffness of right elbow, not elsewhere classified: Secondary | ICD-10-CM

## 2023-04-18 DIAGNOSIS — R29898 Other symptoms and signs involving the musculoskeletal system: Secondary | ICD-10-CM

## 2023-04-18 DIAGNOSIS — M25641 Stiffness of right hand, not elsewhere classified: Secondary | ICD-10-CM

## 2023-04-18 DIAGNOSIS — M25611 Stiffness of right shoulder, not elsewhere classified: Secondary | ICD-10-CM

## 2023-04-18 NOTE — Therapy (Signed)
OUTPATIENT OCCUPATIONAL THERAPY ORTHO TREATMENT  Patient Name: Stephanie Ramos MRN: [id number] DOB:1945-10-06, 77 y.o., female Today's Date: 04/18/2023    END OF SESSION:  OT End of Session - 04/18/23 1057     Visit Number 4    Number of Visits 24    Date for OT Re-Evaluation 06/09/23    Authorization Type [employer], copay: $15    Authorization Time Period no auth required    OT Start Time 1018    OT Stop Time 1058    OT Time Calculation (min) 40 min    Activity Tolerance Patient tolerated treatment well    Behavior During Therapy WFL for tasks assessed/performed                Past Medical History:  Diagnosis Date   High iron content of liver determined by magnetic resonance imaging 08/02/2021   Hyperlipemia    Past Surgical History:  Procedure Laterality Date   BREAST SURGERY     CATARACT EXTRACTION W/PHACO Left 08/11/2019   Procedure: CATARACT EXTRACTION PHACO AND INTRAOCULAR LENS PLACEMENT (IOC) (CDE: 9.68);  Surgeon: [name], MD;  Location: [facility];  Service: Ophthalmology;  Laterality: Left;   CATARACT EXTRACTION W/PHACO Right 09/29/2019   Procedure: CATARACT EXTRACTION PHACO AND INTRAOCULAR LENS PLACEMENT RIGHT EYE;  Surgeon: [name], MD;  Location: [facility];  Service: Ophthalmology;  Laterality: Right;  CDE: 10.60   DILITATION & CURRETTAGE/HYSTROSCOPY WITH ESSURE     Patient Active Problem List   Diagnosis Date Noted   Comminuted fracture of right humerus 03/29/2023   Preventative health care 03/10/2022   Hypercalcemia 09/08/2021   High iron content of liver determined by magnetic resonance imaging 08/02/2021   Hyperkalemia 07/21/2021   Elevated ferritin level 06/24/2021   Fatigue 05/20/2021   Iron deficiency 01/18/2021   Elevated BP without diagnosis of hypertension 01/18/2021   Overweight with body mass index (BMI) of 26 to 26.9 in adult 03/18/2020   Refused influenza vaccine 03/18/2020   Vertigo 12/28/2017   Hyperlipemia  12/28/2017   PCP: Dr. Christel Mormon REFERRING PROVIDER: Dr. Teryl Lucy  ONSET DATE: 02/15/23  REFERRING DIAG: rt prox humerus fx with regional pain syndrome of right upper etremity   THERAPY DIAG:  Acute pain of right shoulder  Stiffness of right shoulder, not elsewhere classified  Stiffness of right elbow, not elsewhere classified  Stiffness of right hand, not elsewhere classified  Other symptoms and signs involving the musculoskeletal system  Rationale for Evaluation and Treatment: Rehabilitation  SUBJECTIVE:   SUBJECTIVE STATEMENT: S: I've been trying to hold on to little light stuff.   PERTINENT HISTORY: Pt is a 77 y/o female presenting s/p right proximal humerus fracture after falling when cleaning up in the woods on 02/15/23. Pt attended therapy at [facility] approximately 4 times before transferring to this clinic.   PRECAUTIONS: Shoulder  WEIGHT BEARING RESTRICTIONS: Yes NWB  PAIN:  Are you having pain? No  FALLS: Has patient fallen in last 6 months? Yes. Number of falls 1  PLOF: Independent  PATIENT GOALS: To get the right arm moving again.   NEXT MD VISIT: 05/07/23  OBJECTIVE:  Note: Objective measures were completed at Evaluation unless otherwise noted.  HAND DOMINANCE: Right  ADLs: Overall ADLs: Pt is unable to use the RUE for ADLs. She can hold paper plate between her fingers. She is unable to make a fist or use her hand and arm. Pt is using the LUE for all tasks, using hemi-dressing techniques and  strategies for independence.    FUNCTIONAL OUTCOME MEASURES: Quick Dash: 72.73  UPPER EXTREMITY ROM:       Assessed in sitting, er/IR adducted  Passive ROM Right eval  Shoulder flexion 61  Shoulder abduction 54  Shoulder internal rotation 90  Shoulder external rotation 19  Elbow flexion 136  Elbow extension -30  Wrist flexion 55  Wrist extension 8  Wrist ulnar deviation 25  Wrist radial deviation 8  Wrist pronation 85  Wrist  supination 85  (Blank rows = not tested)   Assessed in sitting, er/IR adducted  Active ROM Right eval  Shoulder flexion 33  Shoulder abduction 41  Shoulder internal rotation 90  Shoulder external rotation 20  Elbow flexion 128  Elbow extension -45  Wrist flexion 55  Wrist extension 8  Wrist ulnar deviation 25  Wrist radial deviation 4  Wrist pronation 70  Wrist supination 80  (Blank rows = not tested)   UPPER EXTREMITY MMT:       Unable to assess due to pain, limited active movement  MMT Right eval  Shoulder flexion   Shoulder abduction   Shoulder internal rotation   Shoulder external rotation   Elbow flexion   Elbow extension   Wrist flexion   Wrist extension   Wrist ulnar deviation   Wrist radial deviation   Wrist pronation   Wrist supination   (Blank rows = not tested)  HAND FUNCTION: Unable, TBD   COORDINATION: Unable, TBD  SENSATION: WFL  EDEMA:  -Right  -Left Upper arm    32cm 30.5cm Elbow           29cm 24cm Mid-forearm 20cm 20.5cm Wrist            17cm 14.5cm Palm            18.5cm 17cm  OBSERVATIONS: pt with generalized edema throughout RUE; joint stiffness in digits, wrist, elbow, and shoulder. Consistent with possibility of developing CRPS noting dx of regional pain syndrome of RUE   TODAY'S TREATMENT:                                                                                                                              DATE:  04/18/23 -Manual: retrograde massage and myofascial release to entire RUE (fingers to shoulder) to decrease stiffness and increase joint ROM in RUE -AA/ROM: supine-40% shoulder flexion, abduction, protraction, er, 10 reps -Scapular A/ROM: extension, row, 10 reps -Low level thumb tacks: 1' -Elbow A/ROM: hammer curl flexion, extension -Wrist A/ROM: seated with forearm on mat table, wrist off the end-extension, flexion, radial/ulnar deviation, 10 reps  04/16/23 -Manual: retrograde massage and myofascial  release to entire RUE (fingers to shoulder) to decrease stiffness and increase joint ROM in RUE -P/ROM: supine- shoulder flexion, abduction, er; elbow flexion and extension, wrist flexion and extension -AA/ROM: supine-40% shoulder flexion, abduction, protraction, er, 10 reps -Scapular A/ROM: extension, row, 10 reps  04/13/23 - Manual: to entire RUE (fingers  to shoulder) retrograde massage to decrease stiffness and increase ROM  -P/ROM: supine; flexion, abduction, elbow flexion and extension, wrist flexion and extension, composite digit flexion  -AA/ROM: supine; 25% shoulder flexion, abduction, bench press to extend elbows    PATIENT EDUCATION: Education details: reviewed HEP Person educated: Patient Education method: Programmer, multimedia, Demonstration, Tactile cues, and Verbal cues Education comprehension: verbalized understanding and returned demonstration  HOME EXERCISE PROGRAM: Eval: Provided shoulder length edema glove/sleeve, educated on donning/doffing, wear and care; continue table slides from [facility] therapist 11/8: AA/ROM in supine  GOALS: Goals reviewed with patient? Yes  SHORT TERM GOALS: Target date: 05/09/23  Pt will be provided with and educated on HEP to improve RUE functioning required for use during ADLs.   Goal status: IN PROGRESS  2.  Pt will increase P/ROM of right shoulder by 50+ degrees to improve ability to perform dressing and bathing tasks independently.   Goal status: IN PROGRESS  3.  Pt will increase A/ROM of right elbow to full extension to improve ability to perform low level functional reaching tasks.   Goal status: IN PROGRESS  4.  Pt will decrease edema in RUE by 50% or greater to improve mobility in RUE required for active participation in bathing and grooming tasks.   Goal status: IN PROGRESS  5.  Pt will demonstrate ability to make at least 50% of a grasp to promote use when grasping large items such as a cup or water bottle.   Goal status:  IN PROGRESS   LONG TERM GOALS: Target date: 06/09/23  Pt will decrease pain in the RUE to 2/10 or less to improve ability to sleep for 3+ consecutive hours without waking due to pain.   Goal status: IN PROGRESS  2.  Pt will increase RUE A/ROM to at least 120 degrees shoulder flexion/abduction, and wrist extension by 25+ degrees to improve use of RUE during meal preparation tasks.   Goal status: IN PROGRESS  3.  Pt will increase RUE strength to 4-/5 or greater to improve ability to use RUE during housework tasks.   Goal status: IN PROGRESS  4.  Pt will increase grip strength to at least 20# and pinch strength to at least 5# to improve ability to grasp and carry groceries.  Baseline:  Goal status: IN PROGRESS  5.  Pt will decrease RUE fascial restrictions to min amounts or less to improve ability to perform functional reaching tasks.   Goal status: IN PROGRESS   ASSESSMENT:  CLINICAL IMPRESSION: Patient reports she has been completing her HEP 3x/day. Continued with manual techniques to RUE, pt with max restriction/tenderness at anterior shoulder/upper arm and in scapular region. Discontinued P/ROM due to pt guarding. Continued with AA/ROM and scapular A/ROM, pt with ROM at approximately 40%. Updated HEP to add scapular A/ROM. Added in elbow and wrist A/ROM exercises today. Consistent verbal and visual cuing for form and technique during exercises.   PERFORMANCE DEFICITS: in functional skills including ADLs, IADLs, coordination, dexterity, proprioception, edema, ROM, strength, pain, fascial restrictions, Fine motor control, Gross motor control, and UE functional use    PLAN:  OT FREQUENCY: 3x/week  OT DURATION: 8 weeks  PLANNED INTERVENTIONS: 97168 OT Re-evaluation, 97535 self care/ADL training, 65784 therapeutic exercise, 97530 therapeutic activity, 97140 manual therapy, 97035 ultrasound, 97018 paraffin, 69629 moist heat, 97010 cryotherapy, 97032 electrical stimulation (manual),  97014 electrical stimulation unattended, 97760 Splinting (initial encounter), and DME and/or AE instructions  CONSULTED AND AGREED WITH PLAN OF CARE: Patient  PLAN FOR NEXT  SESSION: retrograde massage and manual techniques, passive stretching progressing to AA/ROM and A/ROM as able to tolerate. Follow up on HEP  Ezra Sites, OTR/L  [phone] 04/18/2023, 11:01 AM

## 2023-04-18 NOTE — Patient Instructions (Signed)
1) Seated Row   Sit up straight with elbows by your sides. Pull back with shoulders/elbows, keeping forearms straight, as if pulling back on the reins of a horse. Squeeze shoulder blades together. Repeat _10-15__times, __2-3__sets/day    2) Shoulder Extension    Sit up straight with both arms by your side, draw your arms back behind your waist. Keep your elbows straight. Repeat __10-15__times, __2-3__sets/day.       

## 2023-04-19 ENCOUNTER — Other Ambulatory Visit: Payer: Self-pay

## 2023-04-19 DIAGNOSIS — R7989 Other specified abnormal findings of blood chemistry: Secondary | ICD-10-CM

## 2023-04-19 NOTE — Progress Notes (Signed)
Lab orders entered

## 2023-04-20 ENCOUNTER — Inpatient Hospital Stay: Payer: No Typology Code available for payment source | Attending: Hematology

## 2023-04-20 ENCOUNTER — Encounter (HOSPITAL_COMMUNITY): Payer: Self-pay | Admitting: Occupational Therapy

## 2023-04-20 ENCOUNTER — Ambulatory Visit (HOSPITAL_COMMUNITY): Payer: No Typology Code available for payment source | Admitting: Occupational Therapy

## 2023-04-20 DIAGNOSIS — M25641 Stiffness of right hand, not elsewhere classified: Secondary | ICD-10-CM

## 2023-04-20 DIAGNOSIS — M25511 Pain in right shoulder: Secondary | ICD-10-CM

## 2023-04-20 DIAGNOSIS — M25621 Stiffness of right elbow, not elsewhere classified: Secondary | ICD-10-CM

## 2023-04-20 DIAGNOSIS — M25611 Stiffness of right shoulder, not elsewhere classified: Secondary | ICD-10-CM

## 2023-04-20 DIAGNOSIS — R7989 Other specified abnormal findings of blood chemistry: Secondary | ICD-10-CM | POA: Insufficient documentation

## 2023-04-20 LAB — CBC WITH DIFFERENTIAL/PLATELET
Abs Immature Granulocytes: 0.01 10*3/uL (ref 0.00–0.07)
Basophils Absolute: 0 10*3/uL (ref 0.0–0.1)
Basophils Relative: 1 %
Eosinophils Absolute: 0.1 10*3/uL (ref 0.0–0.5)
Eosinophils Relative: 1 %
HCT: 46.3 % — ABNORMAL HIGH (ref 36.0–46.0)
Hemoglobin: 14.6 g/dL (ref 12.0–15.0)
Immature Granulocytes: 0 %
Lymphocytes Relative: 24 %
Lymphs Abs: 1.6 10*3/uL (ref 0.7–4.0)
MCH: 31 pg (ref 26.0–34.0)
MCHC: 31.5 g/dL (ref 30.0–36.0)
MCV: 98.3 fL (ref 80.0–100.0)
Monocytes Absolute: 0.5 10*3/uL (ref 0.1–1.0)
Monocytes Relative: 8 %
Neutro Abs: 4.3 10*3/uL (ref 1.7–7.7)
Neutrophils Relative %: 66 %
Platelets: 392 10*3/uL (ref 150–400)
RBC: 4.71 MIL/uL (ref 3.87–5.11)
RDW: 12.3 % (ref 11.5–15.5)
WBC: 6.5 10*3/uL (ref 4.0–10.5)
nRBC: 0 % (ref 0.0–0.2)

## 2023-04-20 LAB — IRON AND TIBC
Iron: 72 ug/dL (ref 28–170)
Saturation Ratios: 18 % (ref 10.4–31.8)
TIBC: 398 ug/dL (ref 250–450)
UIBC: 326 ug/dL

## 2023-04-20 LAB — FERRITIN: Ferritin: 119 ng/mL (ref 11–307)

## 2023-04-20 NOTE — Therapy (Signed)
OUTPATIENT OCCUPATIONAL THERAPY ORTHO TREATMENT  Patient Name: Stephanie Ramos MRN: [id number] DOB:08-12-1945, 77 y.o., female Today's Date: 04/20/2023    END OF SESSION:  OT End of Session - 04/20/23 1102     Visit Number 5    Number of Visits 24    Date for OT Re-Evaluation 06/09/23    Authorization Type [employer], copay: $15    Authorization Time Period no auth required    OT Start Time 1022    OT Stop Time 1102    OT Time Calculation (min) 40 min    Activity Tolerance Patient tolerated treatment well    Behavior During Therapy WFL for tasks assessed/performed                 Past Medical History:  Diagnosis Date   High iron content of liver determined by magnetic resonance imaging 08/02/2021   Hyperlipemia    Past Surgical History:  Procedure Laterality Date   BREAST SURGERY     CATARACT EXTRACTION W/PHACO Left 08/11/2019   Procedure: CATARACT EXTRACTION PHACO AND INTRAOCULAR LENS PLACEMENT (IOC) (CDE: 9.68);  Surgeon: [name], MD;  Location: [facility];  Service: Ophthalmology;  Laterality: Left;   CATARACT EXTRACTION W/PHACO Right 09/29/2019   Procedure: CATARACT EXTRACTION PHACO AND INTRAOCULAR LENS PLACEMENT RIGHT EYE;  Surgeon: [name], MD;  Location: [facility];  Service: Ophthalmology;  Laterality: Right;  CDE: 10.60   DILITATION & CURRETTAGE/HYSTROSCOPY WITH ESSURE     Patient Active Problem List   Diagnosis Date Noted   Comminuted fracture of right humerus 03/29/2023   Preventative health care 03/10/2022   Hypercalcemia 09/08/2021   High iron content of liver determined by magnetic resonance imaging 08/02/2021   Hyperkalemia 07/21/2021   Elevated ferritin level 06/24/2021   Fatigue 05/20/2021   Iron deficiency 01/18/2021   Elevated BP without diagnosis of hypertension 01/18/2021   Overweight with body mass index (BMI) of 26 to 26.9 in adult 03/18/2020   Refused influenza vaccine 03/18/2020   Vertigo 12/28/2017   Hyperlipemia  12/28/2017   PCP: Dr. Christel Mormon REFERRING PROVIDER: Dr. Teryl Lucy  ONSET DATE: 02/15/23  REFERRING DIAG: rt prox humerus fx with regional pain syndrome of right upper etremity   THERAPY DIAG:  Acute pain of right shoulder  Stiffness of right shoulder, not elsewhere classified  Stiffness of right elbow, not elsewhere classified  Stiffness of right hand, not elsewhere classified  Rationale for Evaluation and Treatment: Rehabilitation  SUBJECTIVE:   SUBJECTIVE STATEMENT: S: I feel like I can do a little more, I can touch my nose .   PERTINENT HISTORY: Pt is a 77 y/o female presenting s/p right proximal humerus fracture after falling when cleaning up in the woods on 02/15/23. Pt attended therapy at [facility] approximately 4 times before transferring to this clinic.   PRECAUTIONS: Shoulder  WEIGHT BEARING RESTRICTIONS: Yes NWB  PAIN:  Are you having pain? No  FALLS: Has patient fallen in last 6 months? Yes. Number of falls 1  PLOF: Independent  PATIENT GOALS: To get the right arm moving again.   NEXT MD VISIT: 05/07/23  OBJECTIVE:  Note: Objective measures were completed at Evaluation unless otherwise noted.  HAND DOMINANCE: Right  ADLs: Overall ADLs: Pt is unable to use the RUE for ADLs. She can hold paper plate between her fingers. She is unable to make a fist or use her hand and arm. Pt is using the LUE for all tasks, using hemi-dressing techniques and strategies for independence.  FUNCTIONAL OUTCOME MEASURES: Quick Dash: 72.73  UPPER EXTREMITY ROM:       Assessed in sitting, er/IR adducted  Passive ROM Right eval  Shoulder flexion 61  Shoulder abduction 54  Shoulder internal rotation 90  Shoulder external rotation 19  Elbow flexion 136  Elbow extension -30  Wrist flexion 55  Wrist extension 8  Wrist ulnar deviation 25  Wrist radial deviation 8  Wrist pronation 85  Wrist supination 85  (Blank rows = not tested)   Assessed in sitting,  er/IR adducted  Active ROM Right eval  Shoulder flexion 33  Shoulder abduction 41  Shoulder internal rotation 90  Shoulder external rotation 20  Elbow flexion 128  Elbow extension -45  Wrist flexion 55  Wrist extension 8  Wrist ulnar deviation 25  Wrist radial deviation 4  Wrist pronation 70  Wrist supination 80  (Blank rows = not tested)   UPPER EXTREMITY MMT:       Unable to assess due to pain, limited active movement  MMT Right eval  Shoulder flexion   Shoulder abduction   Shoulder internal rotation   Shoulder external rotation   Elbow flexion   Elbow extension   Wrist flexion   Wrist extension   Wrist ulnar deviation   Wrist radial deviation   Wrist pronation   Wrist supination   (Blank rows = not tested)  HAND FUNCTION: Unable, TBD   COORDINATION: Unable, TBD  SENSATION: WFL  EDEMA:  -Right  -Left Upper arm    32cm 30.5cm Elbow           29cm 24cm Mid-forearm 20cm 20.5cm Wrist            17cm 14.5cm Palm            18.5cm 17cm  OBSERVATIONS: pt with generalized edema throughout RUE; joint stiffness in digits, wrist, elbow, and shoulder. Consistent with possibility of developing CRPS noting dx of regional pain syndrome of RUE   TODAY'S TREATMENT:                                                                                                                              DATE:   04/20/23 -Manual: retrograde massage and myofascial release to entire RUE (fingers to shoulder) to decrease stiffness and increase joint ROM in RUE -P/ROM: supine- shoulder flexion, abduction, er; elbow flexion and extension, wrist flexion and extension -AA/ROM: supine-50% shoulder flexion, abduction, protraction, er, 10 reps -Scapular A/ROM: extension, row, 10 reps -Wrist stretch: 4 lb weight in hand with OT stabilizing stretching wrist into extension holding 15 seconds 3x   04/18/23 -Manual: retrograde massage and myofascial release to entire RUE (fingers to shoulder)  to decrease stiffness and increase joint ROM in RUE -AA/ROM: supine-40% shoulder flexion, abduction, protraction, er, 10 reps -Scapular A/ROM: extension, row, 10 reps -Low level thumb tacks: 1' -Elbow A/ROM: hammer curl flexion, extension -Wrist A/ROM: seated with forearm on mat table, wrist off the  end-extension, flexion, radial/ulnar deviation, 10 reps  04/16/23 -Manual: retrograde massage and myofascial release to entire RUE (fingers to shoulder) to decrease stiffness and increase joint ROM in RUE -P/ROM: supine- shoulder flexion, abduction, er; elbow flexion and extension, wrist flexion and extension -AA/ROM: supine-40% shoulder flexion, abduction, protraction, er, 10 reps -Scapular A/ROM: extension, row, 10 reps   PATIENT EDUCATION: Education details: reviewed HEP Person educated: Patient Education method: Programmer, multimedia, Demonstration, Tactile cues, and Verbal cues Education comprehension: verbalized understanding and returned demonstration  HOME EXERCISE PROGRAM: Eval: Provided shoulder length edema glove/sleeve, educated on donning/doffing, wear and care; continue table slides from [facility] therapist 11/8: AA/ROM in supine 11/15: wrist stretch into extension   GOALS: Goals reviewed with patient? Yes  SHORT TERM GOALS: Target date: 05/09/23  Pt will be provided with and educated on HEP to improve RUE functioning required for use during ADLs.   Goal status: IN PROGRESS  2.  Pt will increase P/ROM of right shoulder by 50+ degrees to improve ability to perform dressing and bathing tasks independently.   Goal status: IN PROGRESS  3.  Pt will increase A/ROM of right elbow to full extension to improve ability to perform low level functional reaching tasks.   Goal status: IN PROGRESS  4.  Pt will decrease edema in RUE by 50% or greater to improve mobility in RUE required for active participation in bathing and grooming tasks.   Goal status: IN PROGRESS  5.  Pt will  demonstrate ability to make at least 50% of a grasp to promote use when grasping large items such as a cup or water bottle.   Goal status: IN PROGRESS   LONG TERM GOALS: Target date: 06/09/23  Pt will decrease pain in the RUE to 2/10 or less to improve ability to sleep for 3+ consecutive hours without waking due to pain.   Goal status: IN PROGRESS  2.  Pt will increase RUE A/ROM to at least 120 degrees shoulder flexion/abduction, and wrist extension by 25+ degrees to improve use of RUE during meal preparation tasks.   Goal status: IN PROGRESS  3.  Pt will increase RUE strength to 4-/5 or greater to improve ability to use RUE during housework tasks.   Goal status: IN PROGRESS  4.  Pt will increase grip strength to at least 20# and pinch strength to at least 5# to improve ability to grasp and carry groceries.  Baseline:  Goal status: IN PROGRESS  5.  Pt will decrease RUE fascial restrictions to min amounts or less to improve ability to perform functional reaching tasks.   Goal status: IN PROGRESS   ASSESSMENT:  CLINICAL IMPRESSION: Patient continues to demonstrate mod/max fascial restrictions and tenderness. She was able to achieve increased AA/ROM in shoulder flexion and abduction(50%). She reports she is completing HEP and it is going well. She stated that her anterior shoulder has had increased pain after movement. Frequent rest breaks given throughout session due to tightness and fatigue.   PERFORMANCE DEFICITS: in functional skills including ADLs, IADLs, coordination, dexterity, proprioception, edema, ROM, strength, pain, fascial restrictions, Fine motor control, Gross motor control, and UE functional use    PLAN:  OT FREQUENCY: 3x/week  OT DURATION: 8 weeks  PLANNED INTERVENTIONS: 97168 OT Re-evaluation, 97535 self care/ADL training, 69629 therapeutic exercise, 97530 therapeutic activity, 97140 manual therapy, 97035 ultrasound, 97018 paraffin, 52841 moist heat, 97010  cryotherapy, 97032 electrical stimulation (manual), 97014 electrical stimulation unattended, 97760 Splinting (initial encounter), and DME and/or AE instructions  CONSULTED AND  AGREED WITH PLAN OF CARE: Patient  PLAN FOR NEXT SESSION: retrograde massage and manual techniques, passive stretching progressing to AA/ROM and A/ROM as able to tolerate. Follow up on HEP  Lurena Joiner, OTR/L  [phone] 04/20/2023, 11:04 AM

## 2023-04-23 ENCOUNTER — Encounter (HOSPITAL_COMMUNITY): Payer: Self-pay | Admitting: Occupational Therapy

## 2023-04-23 ENCOUNTER — Ambulatory Visit (HOSPITAL_COMMUNITY): Payer: No Typology Code available for payment source | Admitting: Occupational Therapy

## 2023-04-23 DIAGNOSIS — R29898 Other symptoms and signs involving the musculoskeletal system: Secondary | ICD-10-CM

## 2023-04-23 DIAGNOSIS — M25511 Pain in right shoulder: Secondary | ICD-10-CM

## 2023-04-23 DIAGNOSIS — M25621 Stiffness of right elbow, not elsewhere classified: Secondary | ICD-10-CM

## 2023-04-23 DIAGNOSIS — M25641 Stiffness of right hand, not elsewhere classified: Secondary | ICD-10-CM

## 2023-04-23 DIAGNOSIS — M25611 Stiffness of right shoulder, not elsewhere classified: Secondary | ICD-10-CM

## 2023-04-23 NOTE — Therapy (Signed)
OUTPATIENT OCCUPATIONAL THERAPY ORTHO TREATMENT  Patient Name: Stephanie Ramos MRN: [id number] DOB:02/22/1946, 77 y.o., female Today's Date: 04/23/2023    END OF SESSION:  OT End of Session - 04/23/23 1532     Visit Number 6    Number of Visits 24    Date for OT Re-Evaluation 06/09/23    Authorization Type [employer], copay: $15    Authorization Time Period no auth required    OT Start Time 1307    OT Stop Time 1353    OT Time Calculation (min) 46 min    Activity Tolerance Patient tolerated treatment well    Behavior During Therapy [facility] for tasks assessed/performed               Past Medical History:  Diagnosis Date   High iron content of liver determined by magnetic resonance imaging 08/02/2021   Hyperlipemia    Past Surgical History:  Procedure Laterality Date   BREAST SURGERY     CATARACT EXTRACTION W/PHACO Left 08/11/2019   Procedure: CATARACT EXTRACTION PHACO AND INTRAOCULAR LENS PLACEMENT (IOC) (CDE: 9.68);  Surgeon: [name], MD;  Location: [facility];  Service: Ophthalmology;  Laterality: Left;   CATARACT EXTRACTION W/PHACO Right 09/29/2019   Procedure: CATARACT EXTRACTION PHACO AND INTRAOCULAR LENS PLACEMENT RIGHT EYE;  Surgeon: [name], MD;  Location: [facility];  Service: Ophthalmology;  Laterality: Right;  CDE: 10.60   DILITATION & CURRETTAGE/HYSTROSCOPY WITH ESSURE     Patient Active Problem List   Diagnosis Date Noted   Comminuted fracture of right humerus 03/29/2023   Preventative health care 03/10/2022   Hypercalcemia 09/08/2021   High iron content of liver determined by magnetic resonance imaging 08/02/2021   Hyperkalemia 07/21/2021   Elevated ferritin level 06/24/2021   Fatigue 05/20/2021   Iron deficiency 01/18/2021   Elevated BP without diagnosis of hypertension 01/18/2021   Overweight with body mass index (BMI) of 26 to 26.9 in adult 03/18/2020   Refused influenza vaccine 03/18/2020   Vertigo 12/28/2017   Hyperlipemia 12/28/2017    PCP: Dr. Christel Mormon REFERRING PROVIDER: Dr. Teryl Lucy  ONSET DATE: 02/15/23  REFERRING DIAG: rt prox humerus fx with regional pain syndrome of right upper etremity   THERAPY DIAG:  Acute pain of right shoulder  Stiffness of right shoulder, not elsewhere classified  Stiffness of right elbow, not elsewhere classified  Stiffness of right hand, not elsewhere classified  Other symptoms and signs involving the musculoskeletal system  Rationale for Evaluation and Treatment: Rehabilitation  SUBJECTIVE:   SUBJECTIVE STATEMENT: S: I feel like I can do a little more, I can touch my nose .   PERTINENT HISTORY: Pt is a 77 y/o female presenting s/p right proximal humerus fracture after falling when cleaning up in the woods on 02/15/23. Pt attended therapy at [facility] approximately 4 times before transferring to this clinic.   PRECAUTIONS: Shoulder  WEIGHT BEARING RESTRICTIONS: Yes NWB  PAIN:  Are you having pain? No  FALLS: Has patient fallen in last 6 months? Yes. Number of falls 1  PLOF: Independent  PATIENT GOALS: To get the right arm moving again.   NEXT MD VISIT: 05/07/23  OBJECTIVE:  Note: Objective measures were completed at Evaluation unless otherwise noted.  HAND DOMINANCE: Right  ADLs: Overall ADLs: Pt is unable to use the RUE for ADLs. She can hold paper plate between her fingers. She is unable to make a fist or use her hand and arm. Pt is using the LUE for all tasks,  using hemi-dressing techniques and strategies for independence.    FUNCTIONAL OUTCOME MEASURES: Quick Dash: 72.73  UPPER EXTREMITY ROM:       Assessed in sitting, er/IR adducted  Passive ROM Right eval  Shoulder flexion 61  Shoulder abduction 54  Shoulder internal rotation 90  Shoulder external rotation 19  Elbow flexion 136  Elbow extension -30  Wrist flexion 55  Wrist extension 8  Wrist ulnar deviation 25  Wrist radial deviation 8  Wrist pronation 85  Wrist supination 85   (Blank rows = not tested)   Assessed in sitting, er/IR adducted  Active ROM Right eval  Shoulder flexion 33  Shoulder abduction 41  Shoulder internal rotation 90  Shoulder external rotation 20  Elbow flexion 128  Elbow extension -45  Wrist flexion 55  Wrist extension 8  Wrist ulnar deviation 25  Wrist radial deviation 4  Wrist pronation 70  Wrist supination 80  (Blank rows = not tested)   UPPER EXTREMITY MMT:       Unable to assess due to pain, limited active movement  MMT Right eval  Shoulder flexion   Shoulder abduction   Shoulder internal rotation   Shoulder external rotation   Elbow flexion   Elbow extension   Wrist flexion   Wrist extension   Wrist ulnar deviation   Wrist radial deviation   Wrist pronation   Wrist supination   (Blank rows = not tested)  HAND FUNCTION: Unable, TBD   COORDINATION: Unable, TBD  SENSATION: WFL  EDEMA:  -Right  -Left Upper arm    32cm 30.5cm Elbow           29cm 24cm Mid-forearm 20cm 20.5cm Wrist            17cm 14.5cm Palm            18.5cm 17cm  OBSERVATIONS: pt with generalized edema throughout RUE; joint stiffness in digits, wrist, elbow, and shoulder. Consistent with possibility of developing CRPS noting dx of regional pain syndrome of RUE   TODAY'S TREATMENT:                                                                                                                              DATE:   04/23/23 -Manual: retrograde massage and myofascial release to entire RUE (fingers to shoulder) to decrease stiffness and increase joint ROM in RUE -P/ROM: digit flexion, x10 each digit, wrist flexion, extension, ulnar/radial deviation, supination/pronation, x10, elbow flexion/extension, x10 -AA/ROM: shoulder flexion, protraction, abduction, horizontal abduction, er/IR, x15 -A/ROM: elbow flexion/extension, wrist flexion/extension, supination/pronation, x10 -wrist stretch: flexion, 2# hanging hand off  table  04/20/23 -Manual: retrograde massage and myofascial release to entire RUE (fingers to shoulder) to decrease stiffness and increase joint ROM in RUE -P/ROM: supine- shoulder flexion, abduction, er; elbow flexion and extension, wrist flexion and extension -AA/ROM: supine-50% shoulder flexion, abduction, protraction, er, 10 reps -Scapular A/ROM: extension, row, 10 reps -Wrist stretch: 4 lb weight in  hand with OT stabilizing stretching wrist into extension holding 15 seconds 3x   04/18/23 -Manual: retrograde massage and myofascial release to entire RUE (fingers to shoulder) to decrease stiffness and increase joint ROM in RUE -AA/ROM: supine-40% shoulder flexion, abduction, protraction, er, 10 reps -Scapular A/ROM: extension, row, 10 reps -Low level thumb tacks: 1' -Elbow A/ROM: hammer curl flexion, extension -Wrist A/ROM: seated with forearm on mat table, wrist off the end-extension, flexion, radial/ulnar deviation, 10 reps   PATIENT EDUCATION: Education details: reviewed HEP Person educated: Patient Education method: Programmer, multimedia, Demonstration, Tactile cues, and Verbal cues Education comprehension: verbalized understanding and returned demonstration  HOME EXERCISE PROGRAM: Eval: Provided shoulder length edema glove/sleeve, educated on donning/doffing, wear and care; continue table slides from [facility] therapist 11/8: AA/ROM in supine 11/15: wrist stretch into extension   GOALS: Goals reviewed with patient? Yes  SHORT TERM GOALS: Target date: 05/09/23  Pt will be provided with and educated on HEP to improve RUE functioning required for use during ADLs.   Goal status: IN PROGRESS  2.  Pt will increase P/ROM of right shoulder by 50+ degrees to improve ability to perform dressing and bathing tasks independently.   Goal status: IN PROGRESS  3.  Pt will increase A/ROM of right elbow to full extension to improve ability to perform low level functional reaching tasks.    Goal status: IN PROGRESS  4.  Pt will decrease edema in RUE by 50% or greater to improve mobility in RUE required for active participation in bathing and grooming tasks.   Goal status: IN PROGRESS  5.  Pt will demonstrate ability to make at least 50% of a grasp to promote use when grasping large items such as a cup or water bottle.   Goal status: IN PROGRESS   LONG TERM GOALS: Target date: 06/09/23  Pt will decrease pain in the RUE to 2/10 or less to improve ability to sleep for 3+ consecutive hours without waking due to pain.   Goal status: IN PROGRESS  2.  Pt will increase RUE A/ROM to at least 120 degrees shoulder flexion/abduction, and wrist extension by 25+ degrees to improve use of RUE during meal preparation tasks.   Goal status: IN PROGRESS  3.  Pt will increase RUE strength to 4-/5 or greater to improve ability to use RUE during housework tasks.   Goal status: IN PROGRESS  4.  Pt will increase grip strength to at least 20# and pinch strength to at least 5# to improve ability to grasp and carry groceries.  Baseline:  Goal status: IN PROGRESS  5.  Pt will decrease RUE fascial restrictions to min amounts or less to improve ability to perform functional reaching tasks.   Goal status: IN PROGRESS   ASSESSMENT:  CLINICAL IMPRESSION: Patient continues to have moderate pain with all mobility, increasing with manual therapy and P/ROM. She is able to tolerate AA/ROM, however struggles to push herself into a full stretch. Elbow and wrist A/ROM are improving incrementally with decreasing pain as well. OT providing verbal and tactile cuing for positioning and technique throughout, as well as education on dressing in pull over shirts with RUE first.   PERFORMANCE DEFICITS: in functional skills including ADLs, IADLs, coordination, dexterity, proprioception, edema, ROM, strength, pain, fascial restrictions, Fine motor control, Gross motor control, and UE functional  use    PLAN:  OT FREQUENCY: 3x/week  OT DURATION: 8 weeks  PLANNED INTERVENTIONS: 97168 OT Re-evaluation, 97535 self care/ADL training, 60454 therapeutic exercise, 97530 therapeutic activity,  97140 manual therapy, 97035 ultrasound, 09811 paraffin, 97010 moist heat, 97010 cryotherapy, 97032 electrical stimulation (manual), 97014 electrical stimulation unattended, 97760 Splinting (initial encounter), and DME and/or AE instructions  CONSULTED AND AGREED WITH PLAN OF CARE: Patient  PLAN FOR NEXT SESSION: retrograde massage and manual techniques, passive stretching progressing to AA/ROM and A/ROM as able to tolerate. Follow up on HEP   Trish Mage, OTR/L  [phone] 04/23/2023, 3:33 PM

## 2023-04-25 ENCOUNTER — Ambulatory Visit (HOSPITAL_COMMUNITY): Payer: No Typology Code available for payment source | Admitting: Occupational Therapy

## 2023-04-25 ENCOUNTER — Encounter (HOSPITAL_COMMUNITY): Payer: Self-pay | Admitting: Occupational Therapy

## 2023-04-25 DIAGNOSIS — M25611 Stiffness of right shoulder, not elsewhere classified: Secondary | ICD-10-CM

## 2023-04-25 DIAGNOSIS — R29898 Other symptoms and signs involving the musculoskeletal system: Secondary | ICD-10-CM

## 2023-04-25 DIAGNOSIS — M25621 Stiffness of right elbow, not elsewhere classified: Secondary | ICD-10-CM

## 2023-04-25 DIAGNOSIS — M25511 Pain in right shoulder: Secondary | ICD-10-CM | POA: Diagnosis not present

## 2023-04-25 DIAGNOSIS — M25641 Stiffness of right hand, not elsewhere classified: Secondary | ICD-10-CM

## 2023-04-25 NOTE — Therapy (Signed)
OUTPATIENT OCCUPATIONAL THERAPY ORTHO TREATMENT  Patient Name: Stephanie Ramos MRN: [id number] DOB:1945-06-27, 77 y.o., female Today's Date: 04/25/2023    END OF SESSION:  OT End of Session - 04/25/23 1015     Visit Number 7    Number of Visits 24    Date for OT Re-Evaluation 06/09/23    Authorization Type [employer], copay: $15    Authorization Time Period no auth required    OT Start Time 0929    OT Stop Time 1012    OT Time Calculation (min) 43 min    Activity Tolerance Patient tolerated treatment well    Behavior During Therapy [facility] for tasks assessed/performed                Past Medical History:  Diagnosis Date   High iron content of liver determined by magnetic resonance imaging 08/02/2021   Hyperlipemia    Past Surgical History:  Procedure Laterality Date   BREAST SURGERY     CATARACT EXTRACTION W/PHACO Left 08/11/2019   Procedure: CATARACT EXTRACTION PHACO AND INTRAOCULAR LENS PLACEMENT (IOC) (CDE: 9.68);  Surgeon: [name], MD;  Location: [facility];  Service: Ophthalmology;  Laterality: Left;   CATARACT EXTRACTION W/PHACO Right 09/29/2019   Procedure: CATARACT EXTRACTION PHACO AND INTRAOCULAR LENS PLACEMENT RIGHT EYE;  Surgeon: [name], MD;  Location: [facility];  Service: Ophthalmology;  Laterality: Right;  CDE: 10.60   DILITATION & CURRETTAGE/HYSTROSCOPY WITH ESSURE     Patient Active Problem List   Diagnosis Date Noted   Comminuted fracture of right humerus 03/29/2023   Preventative health care 03/10/2022   Hypercalcemia 09/08/2021   High iron content of liver determined by magnetic resonance imaging 08/02/2021   Hyperkalemia 07/21/2021   Elevated ferritin level 06/24/2021   Fatigue 05/20/2021   Iron deficiency 01/18/2021   Elevated BP without diagnosis of hypertension 01/18/2021   Overweight with body mass index (BMI) of 26 to 26.9 in adult 03/18/2020   Refused influenza vaccine 03/18/2020   Vertigo 12/28/2017   Hyperlipemia  12/28/2017   PCP: Dr. Christel Mormon REFERRING PROVIDER: Dr. Teryl Lucy  ONSET DATE: 02/15/23  REFERRING DIAG: rt prox humerus fx with regional pain syndrome of right upper etremity   THERAPY DIAG:  Acute pain of right shoulder  Stiffness of right shoulder, not elsewhere classified  Stiffness of right elbow, not elsewhere classified  Stiffness of right hand, not elsewhere classified  Other symptoms and signs involving the musculoskeletal system  Rationale for Evaluation and Treatment: Rehabilitation  SUBJECTIVE:   SUBJECTIVE STATEMENT: S: I can stretch my arm under the sink more now.   PERTINENT HISTORY: Pt is a 77 y/o female presenting s/p right proximal humerus fracture after falling when [place] in the woods on 02/15/23. Pt attended therapy at [facility] approximately 4 times before transferring to this clinic.   PRECAUTIONS: Shoulder  WEIGHT BEARING RESTRICTIONS: Yes NWB  PAIN:  Are you having pain? No  FALLS: Has patient fallen in last 6 months? Yes. Number of falls 1  PLOF: Independent  PATIENT GOALS: To get the right arm moving again.   NEXT MD VISIT: 05/07/23  OBJECTIVE:  Note: Objective measures were completed at Evaluation unless otherwise noted.  HAND DOMINANCE: Right  ADLs: Overall ADLs: Pt is unable to use the RUE for ADLs. She can hold paper plate between her fingers. She is unable to make a fist or use her hand and arm. Pt is using the LUE for all tasks, using hemi-dressing techniques and  strategies for independence.    FUNCTIONAL OUTCOME MEASURES: Quick Dash: 72.73  UPPER EXTREMITY ROM:       Assessed in sitting, er/IR adducted  Passive ROM Right eval  Shoulder flexion 61  Shoulder abduction 54  Shoulder internal rotation 90  Shoulder external rotation 19  Elbow flexion 136  Elbow extension -30  Wrist flexion 55  Wrist extension 8  Wrist ulnar deviation 25  Wrist radial deviation 8  Wrist pronation 85  Wrist supination 85   (Blank rows = not tested)   Assessed in sitting, er/IR adducted  Active ROM Right eval  Shoulder flexion 33  Shoulder abduction 41  Shoulder internal rotation 90  Shoulder external rotation 20  Elbow flexion 128  Elbow extension -45  Wrist flexion 55  Wrist extension 8  Wrist ulnar deviation 25  Wrist radial deviation 4  Wrist pronation 70  Wrist supination 80  (Blank rows = not tested)   UPPER EXTREMITY MMT:       Unable to assess due to pain, limited active movement  MMT Right eval  Shoulder flexion   Shoulder abduction   Shoulder internal rotation   Shoulder external rotation   Elbow flexion   Elbow extension   Wrist flexion   Wrist extension   Wrist ulnar deviation   Wrist radial deviation   Wrist pronation   Wrist supination   (Blank rows = not tested)  HAND FUNCTION: Unable, TBD   COORDINATION: Unable, TBD  SENSATION: WFL  EDEMA:  -Right  -Left Upper arm    32cm 30.5cm Elbow           29cm 24cm Mid-forearm 20cm 20.5cm Wrist            17cm 14.5cm Palm            18.5cm 17cm  OBSERVATIONS: pt with generalized edema throughout RUE; joint stiffness in digits, wrist, elbow, and shoulder. Consistent with possibility of developing CRPS noting dx of regional pain syndrome of RUE   TODAY'S TREATMENT:                                                                                                                              DATE:  04/25/23 -Manual: retrograde massage and myofascial release to entire RUE to decrease stiffness and increase joint ROM in RUE -AA/ROM: supine-protraction, flexion, horizontal abduction, er, 10 reps -A/ROM: standing-elbow flexion, extension, 10 reps, 2 sets-bicep curl and hammer curl -A/ROM: seated-wrist extension, ulnar/radial deviation, 10 reps -Weighted stretch: wrist extension, 3#, 3x15" holds -Composite digit flexion/extension, 10 reps -Pulleys: 1' flexion  04/23/23 -Manual: retrograde massage and myofascial  release to entire RUE (fingers to shoulder) to decrease stiffness and increase joint ROM in RUE -P/ROM: digit flexion, x10 each digit, wrist flexion, extension, ulnar/radial deviation, supination/pronation, x10, elbow flexion/extension, x10 -AA/ROM: shoulder flexion, protraction, abduction, horizontal abduction, er/IR, x15 -A/ROM: elbow flexion/extension, wrist flexion/extension, supination/pronation, x10 -wrist stretch: flexion, 2# hanging hand off table  04/20/23 -Manual: retrograde massage and myofascial release to entire RUE (fingers to shoulder) to decrease stiffness and increase joint ROM in RUE -P/ROM: supine- shoulder flexion, abduction, er; elbow flexion and extension, wrist flexion and extension -AA/ROM: supine-50% shoulder flexion, abduction, protraction, er, 10 reps -Scapular A/ROM: extension, row, 10 reps -Wrist stretch: 4 lb weight in hand with OT stabilizing stretching wrist into extension holding 15 seconds 3x     PATIENT EDUCATION: Education details: reviewed HEP Person educated: Patient Education method: Programmer, multimedia, Demonstration, Tactile cues, and Verbal cues Education comprehension: verbalized understanding and returned demonstration  HOME EXERCISE PROGRAM: Eval: Provided shoulder length edema glove/sleeve, educated on donning/doffing, wear and care; continue table slides from [facility]-Wainer therapist 11/8: AA/ROM in supine 11/15: wrist stretch into extension   GOALS: Goals reviewed with patient? Yes  SHORT TERM GOALS: Target date: 05/09/23  Pt will be provided with and educated on HEP to improve RUE functioning required for use during ADLs.   Goal status: IN PROGRESS  2.  Pt will increase P/ROM of right shoulder by 50+ degrees to improve ability to perform dressing and bathing tasks independently.   Goal status: IN PROGRESS  3.  Pt will increase A/ROM of right elbow to full extension to improve ability to perform low level functional reaching tasks.   Goal  status: IN PROGRESS  4.  Pt will decrease edema in RUE by 50% or greater to improve mobility in RUE required for active participation in bathing and grooming tasks.   Goal status: IN PROGRESS  5.  Pt will demonstrate ability to make at least 50% of a grasp to promote use when grasping large items such as a cup or water bottle.   Goal status: IN PROGRESS   LONG TERM GOALS: Target date: 06/09/23  Pt will decrease pain in the RUE to 2/10 or less to improve ability to sleep for 3+ consecutive hours without waking due to pain.   Goal status: IN PROGRESS  2.  Pt will increase RUE A/ROM to at least 120 degrees shoulder flexion/abduction, and wrist extension by 25+ degrees to improve use of RUE during meal preparation tasks.   Goal status: IN PROGRESS  3.  Pt will increase RUE strength to 4-/5 or greater to improve ability to use RUE during housework tasks.   Goal status: IN PROGRESS  4.  Pt will increase grip strength to at least 20# and pinch strength to at least 5# to improve ability to grasp and carry groceries.  Baseline:  Goal status: IN PROGRESS  5.  Pt will decrease RUE fascial restrictions to min amounts or less to improve ability to perform functional reaching tasks.   Goal status: IN PROGRESS   ASSESSMENT:  CLINICAL IMPRESSION: Patient reports she is trying to actively use her RUE during tasks and is noticing an improvement in her mobility and ability to reach low level items. Continued with manual techniques, deferred passive stretching due to max guarding and pt's low tolerance. Continued with AA/ROM and A/ROM, added pulleys, pt requiring max verbal cuing for correct form. Verbal cuing for form and technique with all tasks.   PERFORMANCE DEFICITS: in functional skills including ADLs, IADLs, coordination, dexterity, proprioception, edema, ROM, strength, pain, fascial restrictions, Fine motor control, Gross motor control, and UE functional use    PLAN:  OT FREQUENCY:  3x/week  OT DURATION: 8 weeks  PLANNED INTERVENTIONS: 97168 OT Re-evaluation, 97535 self care/ADL training, 95638 therapeutic exercise, 97530 therapeutic activity, 97140 manual therapy, 97035 ultrasound, 97018 paraffin, 75643  moist heat, 97010 cryotherapy, [id number] electrical stimulation (manual), 95621 electrical stimulation unattended, 97760 Splinting (initial encounter), and DME and/or AE instructions  CONSULTED AND AGREED WITH PLAN OF CARE: Patient  PLAN FOR NEXT SESSION: retrograde massage and manual techniques, passive stretching progressing to AA/ROM and A/ROM as able to tolerate. Follow up on HEP   Ezra Sites, OTR/L  [phone] 04/25/2023, 10:15 AM

## 2023-04-27 ENCOUNTER — Encounter (HOSPITAL_COMMUNITY): Payer: Self-pay | Admitting: Occupational Therapy

## 2023-04-27 ENCOUNTER — Ambulatory Visit (HOSPITAL_COMMUNITY): Payer: No Typology Code available for payment source | Admitting: Occupational Therapy

## 2023-04-27 ENCOUNTER — Inpatient Hospital Stay (HOSPITAL_BASED_OUTPATIENT_CLINIC_OR_DEPARTMENT_OTHER): Payer: No Typology Code available for payment source | Admitting: Oncology

## 2023-04-27 VITALS — BP 143/83 | HR 90 | Temp 97.7°F | Resp 17 | Ht 61.0 in | Wt 140.5 lb

## 2023-04-27 DIAGNOSIS — M25511 Pain in right shoulder: Secondary | ICD-10-CM

## 2023-04-27 DIAGNOSIS — R7989 Other specified abnormal findings of blood chemistry: Secondary | ICD-10-CM

## 2023-04-27 DIAGNOSIS — M25641 Stiffness of right hand, not elsewhere classified: Secondary | ICD-10-CM

## 2023-04-27 DIAGNOSIS — M25621 Stiffness of right elbow, not elsewhere classified: Secondary | ICD-10-CM

## 2023-04-27 DIAGNOSIS — R29898 Other symptoms and signs involving the musculoskeletal system: Secondary | ICD-10-CM

## 2023-04-27 DIAGNOSIS — M25611 Stiffness of right shoulder, not elsewhere classified: Secondary | ICD-10-CM

## 2023-04-27 NOTE — Progress Notes (Signed)
[facility] [address], [place] [address]    Clinic Day:  04/27/2023  Referring physician: Billie Lade, MD  Patient Care Team: Billie Lade, MD as PCP - General (Internal Medicine) Doreatha Massed, MD as Medical Oncologist (Hematology)   ASSESSMENT & PLAN:   Assessment: 1.  Elevated ferritin levels: - Patient was on iron tablet daily for many years, stopped in August 2022 when her ferritin was found to be elevated at 1609 with percent saturation 33. - She started taking woman's once a day vitamin which has iron in it. - Recent ferritin on 05/16/2021 was 1875.  He was told to stop taking woman's vitamin. - She denies any prior history of transfusion or parenteral iron therapy. - Hemochromatosis workup: Negative - MRI liver (07/27/2021): Hepatic iron deposition. - She was on intermittent phlebotomies, last phlebotomy on 04/24/2022.  2.  Social/family history: -She lives at home with her husband.  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 - No family history of hemochromatosis or malignancies.  Plan: 1.  Elevated ferritin levels: - Her last phlebotomy was on 04/24/2022. - Labs from 04/20/2023 show a ferritin of 119 (95) with an iron saturation of 18%.  Hemoglobin is 14.6. -We discussed goal ferritin is less than 100. -Proceed with phlebotomy in the next 1 to 2 weeks. -Return to clinic in 4 months with repeat lab work and see MD/APP for follow-up for few days later.  No orders of the defined types were placed in this encounter.  PLAN SUMMARY: >> Phlebotomy in the next 1 to 2 weeks. >> Return to clinic in 4 months for follow-up with labs a few days before.    I spent 20 minutes dedicated to the care of this patient (face-to-face and non-face-to-face) on the date of the encounter to include what is described in the assessment and plan.   Mauro Kaufmann, NP   11/22/202411:45 AM  CHIEF COMPLAINT:    Diagnosis: elevated ferritin   Prior Therapy: Intermittent phlebotomy  Current Therapy: Observation   INTERVAL HISTORY:   Khyle is a 77 y.o. female presenting to clinic today for follow up of elevated ferritin. She was last in clinic in July 2024.  Due to signs of hepatic and reticuloendothelial iron deposition, she underwent phlebotomy therapy to reach goal of ferritin <100 in November 2023.   In the interim, she fell on 02/15/2023 injuring her right shoulder suffering a right proximal humerus fracture.  She was sent home from the ED with sling, pain meds and instructed to follow-up with Ortho.  She has significant reduction in range of motion and strength to her right arm especially her right hand.  She is currently receiving physical and occupational therapy which is helping some.  Today, she states that she is doing well overall.  Her appetite level is at 100%. Her energy level is at 100%. She denies any fevers, night sweats or weight loss.  No infections recently.   PAST MEDICAL HISTORY:   Past Medical History: Past Medical History:  Diagnosis Date   High iron content of liver determined by magnetic resonance imaging 08/02/2021   Hyperlipemia     Surgical History: Past Surgical History:  Procedure Laterality Date   BREAST SURGERY     CATARACT EXTRACTION W/PHACO Left 08/11/2019   Procedure: CATARACT EXTRACTION PHACO AND INTRAOCULAR LENS PLACEMENT (IOC) (CDE: 9.68);  Surgeon: [name], MD;  Location: [facility];  Service: Ophthalmology;  Laterality: Left;  CATARACT EXTRACTION W/PHACO Right 09/29/2019   Procedure: CATARACT EXTRACTION PHACO AND INTRAOCULAR LENS PLACEMENT RIGHT EYE;  Surgeon: [name], MD;  Location: [facility];  Service: Ophthalmology;  Laterality: Right;  CDE: 10.60   DILITATION & CURRETTAGE/HYSTROSCOPY WITH ESSURE      Social History: Social History   Socioeconomic History   Marital status: Married    Spouse name: Not on file   Number of  children: 1   Years of education: Not on file   Highest education level: Not on file  Occupational History   Not on file  Tobacco Use   Smoking status: Never   Smokeless tobacco: Never  Substance and Sexual Activity   Alcohol use: Never   Drug use: Never   Sexual activity: Not on file  Other Topics Concern   Not on file  Social History Narrative   Lives with husband of 49 years   They have one child and one grandchild      She enjoys reading romance novels      Diet: all food groups   Caffeine: Limited   Water: 1/2 gallon or more a day      Wears seat belt   Does not use phone while driving   Psychologist, sport and exercise at home   Chartered certified accountant safe box    Social Determinants of Health   Financial Resource Strain: Low Risk  (10/09/2022)   Overall Financial Resource Strain (CARDIA)    Difficulty of Paying Living Expenses: Not hard at all  Food Insecurity: No Food Insecurity (10/09/2022)   Hunger Vital Sign    Worried About Running Out of Food in the Last Year: Never true    Ran Out of Food in the Last Year: Never true  Transportation Needs: No Transportation Needs (10/09/2022)   PRAPARE - Administrator, Civil Service (Medical): No    Lack of Transportation (Non-Medical): No  Physical Activity: Sufficiently Active (10/09/2022)   Exercise Vital Sign    Days of Exercise per Week: 5 days    Minutes of Exercise per Session: 30 min  Stress: No Stress Concern Present (10/09/2022)   [employer] of Occupational Health - Occupational Stress Questionnaire    Feeling of Stress : Not at all  Social Connections: Moderately Integrated (10/09/2022)   Social Connection and Isolation Panel [NHANES]    Frequency of Communication with Friends and Family: More than three times a week    Frequency of Social Gatherings with Friends and Family: More than three times a week    Attends Religious Services: More than 4 times per year    Active Member of [employer] or Organizations: No     Attends Banker Meetings: Never    Marital Status: Married  Catering manager Violence: Not At Risk (10/09/2022)   Humiliation, Afraid, Rape, and Kick questionnaire    Fear of Current or Ex-Partner: No    Emotionally Abused: No    Physically Abused: No    Sexually Abused: No    Family History: No family history on file.  Current Medications:  Current Outpatient Medications:    Calcium Carb-Cholecalciferol 600-800 MG-UNIT TABS, Take 1 tablet by mouth in the morning and at bedtime., Disp: , Rfl:    cyanocobalamin 1000 MCG tablet, Take 1,000 mcg by mouth daily., Disp: , Rfl:    ezetimibe (ZETIA) 10 MG tablet, Take 1 tablet by mouth once daily, Disp: 90 tablet, Rfl: 0   meclizine (ANTIVERT) 25 MG tablet,  Take 1 tablet (25 mg total) by mouth 3 (three) times daily as needed for dizziness., Disp: 30 tablet, Rfl: 2   omega-3 acid ethyl esters (LOVAZA) 1 g capsule, Take by mouth 2 (two) times daily., Disp: , Rfl:    oxyCODONE-acetaminophen (PERCOCET/ROXICET) 5-325 MG tablet, Take 1 tablet by mouth every 6 (six) hours as needed for severe pain., Disp: 20 tablet, Rfl: 0   simvastatin (ZOCOR) 40 MG tablet, Take 1 tablet (40 mg total) by mouth daily., Disp: 100 tablet, Rfl: 0   Vitamin A 2400 MCG (8000 UT) TABS, Take 8,000 Units by mouth daily., Disp: , Rfl:    Vitamin E 400 units TABS, Take 400 Units by mouth daily., Disp: , Rfl:    Allergies: Allergies  Allergen Reactions   Sulfa Antibiotics Rash    REVIEW OF SYSTEMS:   Review of Systems  Musculoskeletal:  Positive for arthralgias (Right arm and shoulder).  Neurological:  Positive for dizziness.     VITALS:   Blood pressure (!) 143/83, pulse 90, temperature 97.7 F (36.5 C), temperature source Oral, resp. rate 17, height 5\' 1"  (1.549 m), weight 140 lb 8 oz (63.7 kg), SpO2 100%.  Wt Readings from Last 3 Encounters:  04/27/23 140 lb 8 oz (63.7 kg)  03/29/23 144 lb 12.8 oz (65.7 kg)  02/15/23 143 lb (64.9 kg)    Body mass  index is 26.55 kg/m.  Performance status (ECOG): 1 - Symptomatic but completely ambulatory  PHYSICAL EXAM:   Physical Exam Constitutional:      Appearance: Normal appearance.  Cardiovascular:     Rate and Rhythm: Normal rate and regular rhythm.  Pulmonary:     Effort: Pulmonary effort is normal.     Breath sounds: Normal breath sounds.  Abdominal:     General: Bowel sounds are normal.     Palpations: Abdomen is soft.  Musculoskeletal:        General: No swelling. Normal range of motion.  Neurological:     Mental Status: She is alert and oriented to person, place, and time. Mental status is at baseline.     LABS:      Latest Ref Rng & Units 04/20/2023   10:01 AM 12/22/2022   12:28 PM 08/22/2022    1:01 PM  CBC  WBC 4.0 - 10.5 K/uL 6.5  5.8  7.0   Hemoglobin 12.0 - 15.0 g/dL 78.2  95.6  21.3   Hematocrit 36.0 - 46.0 % 46.3  43.4  43.7   Platelets 150 - 400 K/uL 392  345  376       Latest Ref Rng & Units 12/22/2022   12:28 PM 01/23/2022    1:07 PM 09/08/2021   11:15 AM  CMP  Glucose 70 - 99 mg/dL  97  086   BUN 8 - 23 mg/dL  13  16   Creatinine 5.78 - 1.00 mg/dL  4.69  6.29   Sodium 528 - 145 mmol/L  141  145   Potassium 3.5 - 5.1 mmol/L  4.1  4.4   Chloride 98 - 111 mmol/L  106  106   CO2 22 - 32 mmol/L  29  22   Calcium 8.9 - 10.3 mg/dL  9.4  9.7   Total Protein 6.5 - 8.1 g/dL 7.2  7.6    Total Bilirubin 0.3 - 1.2 mg/dL 0.6  0.3    Alkaline Phos 38 - 126 U/L 78  68    AST 15 - 41 U/L 35  25    ALT 0 - 44 U/L 40  29       No results found for: "CEA1", "CEA" / No results found for: "CEA1", "CEA" No results found for: "PSA1" No results found for: "MVH846" No results found for: "CAN125"  No results found for: "TOTALPROTELP", "ALBUMINELP", "A1GS", "A2GS", "BETS", "BETA2SER", "GAMS", "MSPIKE", "SPEI" Lab Results  Component Value Date   TIBC 398 04/20/2023   TIBC 383 12/22/2022   TIBC 408 08/22/2022   FERRITIN 119 04/20/2023   FERRITIN 95 12/22/2022    FERRITIN 85 08/22/2022   IRONPCTSAT 18 04/20/2023   IRONPCTSAT 18 12/22/2022   IRONPCTSAT 10 (L) 08/22/2022   Lab Results  Component Value Date   LDH 174 05/20/2021     STUDIES:   No results found.

## 2023-04-27 NOTE — Therapy (Unsigned)
OUTPATIENT OCCUPATIONAL THERAPY ORTHO TREATMENT  Patient Name: Stephanie Ramos MRN: [id number] DOB:Jul 27, 1945, 77 y.o., female Today's Date: 04/28/2023    END OF SESSION:  OT End of Session - 04/27/23 1346     Visit Number 8    Number of Visits 24    Date for OT Re-Evaluation 06/09/23    Authorization Type [employer], copay: $15    Authorization Time Period no auth required    OT Start Time 1304    OT Stop Time 1346    OT Time Calculation (min) 42 min    Activity Tolerance Patient tolerated treatment well    Behavior During Therapy WFL for tasks assessed/performed             Past Medical History:  Diagnosis Date   High iron content of liver determined by magnetic resonance imaging 08/02/2021   Hyperlipemia    Past Surgical History:  Procedure Laterality Date   BREAST SURGERY     CATARACT EXTRACTION W/PHACO Left 08/11/2019   Procedure: CATARACT EXTRACTION PHACO AND INTRAOCULAR LENS PLACEMENT (IOC) (CDE: 9.68);  Surgeon: [name], MD;  Location: [facility];  Service: Ophthalmology;  Laterality: Left;   CATARACT EXTRACTION W/PHACO Right 09/29/2019   Procedure: CATARACT EXTRACTION PHACO AND INTRAOCULAR LENS PLACEMENT RIGHT EYE;  Surgeon: [name], MD;  Location: [facility];  Service: Ophthalmology;  Laterality: Right;  CDE: 10.60   DILITATION & CURRETTAGE/HYSTROSCOPY WITH ESSURE     Patient Active Problem List   Diagnosis Date Noted   Comminuted fracture of right humerus 03/29/2023   Preventative health care 03/10/2022   Hypercalcemia 09/08/2021   High iron content of liver determined by magnetic resonance imaging 08/02/2021   Hyperkalemia 07/21/2021   Elevated ferritin level 06/24/2021   Fatigue 05/20/2021   Iron deficiency 01/18/2021   Elevated BP without diagnosis of hypertension 01/18/2021   Overweight with body mass index (BMI) of 26 to 26.9 in adult 03/18/2020   Refused influenza vaccine 03/18/2020   Vertigo 12/28/2017   Hyperlipemia 12/28/2017    PCP: Dr. Christel Mormon REFERRING PROVIDER: Dr. Teryl Lucy  ONSET DATE: 02/15/23  REFERRING DIAG: rt prox humerus fx with regional pain syndrome of right upper etremity   THERAPY DIAG:  Acute pain of right shoulder  Stiffness of right shoulder, not elsewhere classified  Stiffness of right elbow, not elsewhere classified  Stiffness of right hand, not elsewhere classified  Other symptoms and signs involving the musculoskeletal system  Rationale for Evaluation and Treatment: Rehabilitation  SUBJECTIVE:   SUBJECTIVE STATEMENT: S: I still can't trim my own hair yet.  PERTINENT HISTORY: Pt is a 77 y/o female presenting s/p right proximal humerus fracture after falling when cleaning up in the woods on 02/15/23. Pt attended therapy at [facility] approximately 4 times before transferring to this clinic.   PRECAUTIONS: Shoulder  WEIGHT BEARING RESTRICTIONS: Yes NWB  PAIN:  Are you having pain? No  FALLS: Has patient fallen in last 6 months? Yes. Number of falls 1  PLOF: Independent  PATIENT GOALS: To get the right arm moving again.   NEXT MD VISIT: 05/07/23  OBJECTIVE:  Note: Objective measures were completed at Evaluation unless otherwise noted.  HAND DOMINANCE: Right  ADLs: Overall ADLs: Pt is unable to use the RUE for ADLs. She can hold paper plate between her fingers. She is unable to make a fist or use her hand and arm. Pt is using the LUE for all tasks, using hemi-dressing techniques and strategies for independence.  FUNCTIONAL OUTCOME MEASURES: Quick Dash: 72.73  UPPER EXTREMITY ROM:       Assessed in sitting, er/IR adducted  Passive ROM Right eval  Shoulder flexion 61  Shoulder abduction 54  Shoulder internal rotation 90  Shoulder external rotation 19  Elbow flexion 136  Elbow extension -30  Wrist flexion 55  Wrist extension 8  Wrist ulnar deviation 25  Wrist radial deviation 8  Wrist pronation 85  Wrist supination 85  (Blank rows = not  tested)   Assessed in sitting, er/IR adducted  Active ROM Right eval  Shoulder flexion 33  Shoulder abduction 41  Shoulder internal rotation 90  Shoulder external rotation 20  Elbow flexion 128  Elbow extension -45  Wrist flexion 55  Wrist extension 8  Wrist ulnar deviation 25  Wrist radial deviation 4  Wrist pronation 70  Wrist supination 80  (Blank rows = not tested)   UPPER EXTREMITY MMT:       Unable to assess due to pain, limited active movement  MMT Right eval  Shoulder flexion   Shoulder abduction   Shoulder internal rotation   Shoulder external rotation   Elbow flexion   Elbow extension   Wrist flexion   Wrist extension   Wrist ulnar deviation   Wrist radial deviation   Wrist pronation   Wrist supination   (Blank rows = not tested)  HAND FUNCTION: Unable, TBD   COORDINATION: Unable, TBD  SENSATION: WFL  EDEMA:  -Right  -Left Upper arm    32cm 30.5cm Elbow           29cm 24cm Mid-forearm 20cm 20.5cm Wrist            17cm 14.5cm Palm            18.5cm 17cm  OBSERVATIONS: pt with generalized edema throughout RUE; joint stiffness in digits, wrist, elbow, and shoulder. Consistent with possibility of developing CRPS noting dx of regional pain syndrome of RUE   TODAY'S TREATMENT:                                                                                                                              DATE:   04/27/23 -Manual: retrograde massage and myofascial release to entire RUE to decrease stiffness and increase joint ROM in RUE -AA/ROM: supine, flexion, abduction, protraction, horizontal abduction, er/IR, x12 -A/ROM: elbow flexion/extension, wrist flexion/extension, supination/pronation, x15 -Composite digit flexion/extension, 10 reps -Diagonal Pipe slides: flexion, abduction, x10 each  04/25/23 -Manual: retrograde massage and myofascial release to entire RUE to decrease stiffness and increase joint ROM in RUE -AA/ROM:  supine-protraction, flexion, horizontal abduction, er, 10 reps -A/ROM: standing-elbow flexion, extension, 10 reps, 2 sets-bicep curl and hammer curl -A/ROM: seated-wrist extension, ulnar/radial deviation, 10 reps -Weighted stretch: wrist extension, 3#, 3x15" holds -Composite digit flexion/extension, 10 reps -Pulleys: 1' flexion  04/23/23 -Manual: retrograde massage and myofascial release to entire RUE (fingers to shoulder) to decrease stiffness and increase joint ROM  in RUE -P/ROM: digit flexion, x10 each digit, wrist flexion, extension, ulnar/radial deviation, supination/pronation, x10, elbow flexion/extension, x10 -AA/ROM: shoulder flexion, protraction, abduction, horizontal abduction, er/IR, x15 -A/ROM: elbow flexion/extension, wrist flexion/extension, supination/pronation, x10 -wrist stretch: flexion, 2# hanging hand off table    PATIENT EDUCATION: Education details: reviewed HEP Person educated: Patient Education method: Programmer, multimedia, Demonstration, Tactile cues, and Verbal cues Education comprehension: verbalized understanding and returned demonstration  HOME EXERCISE PROGRAM: Eval: Provided shoulder length edema glove/sleeve, educated on donning/doffing, wear and care; continue table slides from [facility]Wainer therapist 11/8: AA/ROM in supine 11/15: wrist stretch into extension   GOALS: Goals reviewed with patient? Yes  SHORT TERM GOALS: Target date: 05/09/23  Pt will be provided with and educated on HEP to improve RUE functioning required for use during ADLs.   Goal status: IN PROGRESS  2.  Pt will increase P/ROM of right shoulder by 50+ degrees to improve ability to perform dressing and bathing tasks independently.   Goal status: IN PROGRESS  3.  Pt will increase A/ROM of right elbow to full extension to improve ability to perform low level functional reaching tasks.   Goal status: IN PROGRESS  4.  Pt will decrease edema in RUE by 50% or greater to improve mobility  in RUE required for active participation in bathing and grooming tasks.   Goal status: IN PROGRESS  5.  Pt will demonstrate ability to make at least 50% of a grasp to promote use when grasping large items such as a cup or water bottle.   Goal status: IN PROGRESS   LONG TERM GOALS: Target date: 06/09/23  Pt will decrease pain in the RUE to 2/10 or less to improve ability to sleep for 3+ consecutive hours without waking due to pain.   Goal status: IN PROGRESS  2.  Pt will increase RUE A/ROM to at least 120 degrees shoulder flexion/abduction, and wrist extension by 25+ degrees to improve use of RUE during meal preparation tasks.   Goal status: IN PROGRESS  3.  Pt will increase RUE strength to 4-/5 or greater to improve ability to use RUE during housework tasks.   Goal status: IN PROGRESS  4.  Pt will increase grip strength to at least 20# and pinch strength to at least 5# to improve ability to grasp and carry groceries.  Baseline:  Goal status: IN PROGRESS  5.  Pt will decrease RUE fascial restrictions to min amounts or less to improve ability to perform functional reaching tasks.   Goal status: IN PROGRESS   ASSESSMENT:  CLINICAL IMPRESSION: This session, pt continuing to work on using her RUE functionally. This session she required increased time for completing both AA/ROM and A/ROM due to increased stiffness and pain. ROM continues to be limited to approximately 40% of full ROM with moderate pain. Verbal and tactile cuing provided for positioning and technique throughout session.   PERFORMANCE DEFICITS: in functional skills including ADLs, IADLs, coordination, dexterity, proprioception, edema, ROM, strength, pain, fascial restrictions, Fine motor control, Gross motor control, and UE functional use    PLAN:  OT FREQUENCY: 3x/week  OT DURATION: 8 weeks  PLANNED INTERVENTIONS: 97168 OT Re-evaluation, 97535 self care/ADL training, 57846 therapeutic exercise, 97530 therapeutic  activity, 97140 manual therapy, 97035 ultrasound, 97018 paraffin, 96295 moist heat, 97010 cryotherapy, 97032 electrical stimulation (manual), 97014 electrical stimulation unattended, 97760 Splinting (initial encounter), and DME and/or AE instructions  CONSULTED AND AGREED WITH PLAN OF CARE: Patient  PLAN FOR NEXT SESSION: retrograde massage and manual techniques,  passive stretching progressing to AA/ROM and A/ROM as able to tolerate. Follow up on HEP   Trish Mage, OTR/L [phone] 04/28/2023, 8:54 PM

## 2023-04-30 ENCOUNTER — Encounter (HOSPITAL_COMMUNITY): Payer: Self-pay | Admitting: Occupational Therapy

## 2023-04-30 ENCOUNTER — Ambulatory Visit (HOSPITAL_COMMUNITY): Payer: No Typology Code available for payment source | Admitting: Occupational Therapy

## 2023-04-30 DIAGNOSIS — M25511 Pain in right shoulder: Secondary | ICD-10-CM | POA: Diagnosis not present

## 2023-04-30 DIAGNOSIS — M25621 Stiffness of right elbow, not elsewhere classified: Secondary | ICD-10-CM

## 2023-04-30 DIAGNOSIS — M25641 Stiffness of right hand, not elsewhere classified: Secondary | ICD-10-CM

## 2023-04-30 DIAGNOSIS — M25611 Stiffness of right shoulder, not elsewhere classified: Secondary | ICD-10-CM

## 2023-04-30 DIAGNOSIS — R29898 Other symptoms and signs involving the musculoskeletal system: Secondary | ICD-10-CM

## 2023-04-30 NOTE — Therapy (Signed)
OUTPATIENT OCCUPATIONAL THERAPY ORTHO TREATMENT  Patient Name: Stephanie Ramos MRN: [id number] DOB:08/05/1945, 77 y.o., female Today's Date: 04/30/2023    END OF SESSION:  OT End of Session - 04/30/23 1512     Visit Number 9    Number of Visits 24    Date for OT Re-Evaluation 06/09/23    Authorization Type [employer], copay: $15    Authorization Time Period no auth required    OT Start Time 1428    OT Stop Time 1511    OT Time Calculation (min) 43 min    Activity Tolerance Patient tolerated treatment well    Behavior During Therapy WFL for tasks assessed/performed              Past Medical History:  Diagnosis Date   High iron content of liver determined by magnetic resonance imaging 08/02/2021   Hyperlipemia    Past Surgical History:  Procedure Laterality Date   BREAST SURGERY     CATARACT EXTRACTION W/PHACO Left 08/11/2019   Procedure: CATARACT EXTRACTION PHACO AND INTRAOCULAR LENS PLACEMENT (IOC) (CDE: 9.68);  Surgeon: [name], MD;  Location: [facility];  Service: Ophthalmology;  Laterality: Left;   CATARACT EXTRACTION W/PHACO Right 09/29/2019   Procedure: CATARACT EXTRACTION PHACO AND INTRAOCULAR LENS PLACEMENT RIGHT EYE;  Surgeon: [name], MD;  Location: [facility];  Service: Ophthalmology;  Laterality: Right;  CDE: 10.60   DILITATION & CURRETTAGE/HYSTROSCOPY WITH ESSURE     Patient Active Problem List   Diagnosis Date Noted   Comminuted fracture of right humerus 03/29/2023   Preventative health care 03/10/2022   Hypercalcemia 09/08/2021   High iron content of liver determined by magnetic resonance imaging 08/02/2021   Hyperkalemia 07/21/2021   Elevated ferritin level 06/24/2021   Fatigue 05/20/2021   Iron deficiency 01/18/2021   Elevated BP without diagnosis of hypertension 01/18/2021   Overweight with body mass index (BMI) of 26 to 26.9 in adult 03/18/2020   Refused influenza vaccine 03/18/2020   Vertigo 12/28/2017   Hyperlipemia 12/28/2017    PCP: Dr. Christel Mormon REFERRING PROVIDER: Dr. Teryl Lucy  ONSET DATE: 02/15/23  REFERRING DIAG: rt prox humerus fx with regional pain syndrome of right upper etremity   THERAPY DIAG:  Acute pain of right shoulder  Stiffness of right shoulder, not elsewhere classified  Stiffness of right elbow, not elsewhere classified  Stiffness of right hand, not elsewhere classified  Other symptoms and signs involving the musculoskeletal system  Rationale for Evaluation and Treatment: Rehabilitation  SUBJECTIVE:   SUBJECTIVE STATEMENT: S: I can touch my thumb to my pinky now.  PERTINENT HISTORY: Pt is a 77 y/o female presenting s/p right proximal humerus fracture after falling when cleaning up in the woods on 02/15/23. Pt attended therapy at [facility] approximately 4 times before transferring to this clinic.   PRECAUTIONS: Shoulder  WEIGHT BEARING RESTRICTIONS: Yes NWB  PAIN:  Are you having pain? No  FALLS: Has patient fallen in last 6 months? Yes. Number of falls 1  PLOF: Independent  PATIENT GOALS: To get the right arm moving again.   NEXT MD VISIT: 05/07/23  OBJECTIVE:  Note: Objective measures were completed at Evaluation unless otherwise noted.  HAND DOMINANCE: Right  ADLs: Overall ADLs: Pt is unable to use the RUE for ADLs. She can hold paper plate between her fingers. She is unable to make a fist or use her hand and arm. Pt is using the LUE for all tasks, using hemi-dressing techniques and strategies for independence.  FUNCTIONAL OUTCOME MEASURES: Quick Dash: 72.73  UPPER EXTREMITY ROM:       Assessed in sitting, er/IR adducted  Passive ROM Right eval  Shoulder flexion 61  Shoulder abduction 54  Shoulder internal rotation 90  Shoulder external rotation 19  Elbow flexion 136  Elbow extension -30  Wrist flexion 55  Wrist extension 8  Wrist ulnar deviation 25  Wrist radial deviation 8  Wrist pronation 85  Wrist supination 85  (Blank rows = not  tested)   Assessed in sitting, er/IR adducted  Active ROM Right eval  Shoulder flexion 33  Shoulder abduction 41  Shoulder internal rotation 90  Shoulder external rotation 20  Elbow flexion 128  Elbow extension -45  Wrist flexion 55  Wrist extension 8  Wrist ulnar deviation 25  Wrist radial deviation 4  Wrist pronation 70  Wrist supination 80  (Blank rows = not tested)   UPPER EXTREMITY MMT:       Unable to assess due to pain, limited active movement  MMT Right eval  Shoulder flexion   Shoulder abduction   Shoulder internal rotation   Shoulder external rotation   Elbow flexion   Elbow extension   Wrist flexion   Wrist extension   Wrist ulnar deviation   Wrist radial deviation   Wrist pronation   Wrist supination   (Blank rows = not tested)  HAND FUNCTION: Unable, TBD   COORDINATION: Unable, TBD  SENSATION: WFL  EDEMA:  -Right  -Left Upper arm    32cm 30.5cm Elbow           29cm 24cm Mid-forearm 20cm 20.5cm Wrist            17cm 14.5cm Palm            18.5cm 17cm  OBSERVATIONS: pt with generalized edema throughout RUE; joint stiffness in digits, wrist, elbow, and shoulder. Consistent with possibility of developing CRPS noting dx of regional pain syndrome of RUE   TODAY'S TREATMENT:                                                                                                                              DATE:   04/30/23 -Manual: retrograde massage and myofascial release to entire RUE to decrease stiffness and increase joint ROM in RUE -AA/ROM: supine, flexion, abduction, protraction, horizontal abduction, er/IR, x12 -A/ROM: elbow flexion/extension, wrist flexion/extension, supination/pronation, x15 -Low Level wall washes, 2x45" -Ball Rolls: flexion, abduction, x10  04/27/23 -Manual: retrograde massage and myofascial release to entire RUE to decrease stiffness and increase joint ROM in RUE -AA/ROM: supine, flexion, abduction, protraction,  horizontal abduction, er/IR, x12 -A/ROM: elbow flexion/extension, wrist flexion/extension, supination/pronation, x15 -Composite digit flexion/extension, 10 reps -Diagonal Pipe slides: flexion, abduction, x10 each  04/25/23 -Manual: retrograde massage and myofascial release to entire RUE to decrease stiffness and increase joint ROM in RUE -AA/ROM: supine-protraction, flexion, horizontal abduction, er, 10 reps -A/ROM: standing-elbow flexion, extension, 10 reps, 2 sets-bicep curl  and hammer curl -A/ROM: seated-wrist extension, ulnar/radial deviation, 10 reps -Weighted stretch: wrist extension, 3#, 3x15" holds -Composite digit flexion/extension, 10 reps -Pulleys: 1' flexion    PATIENT EDUCATION: Education details: reviewed HEP Person educated: Patient Education method: Explanation, Demonstration, Tactile cues, and Verbal cues Education comprehension: verbalized understanding and returned demonstration  HOME EXERCISE PROGRAM: Eval: Provided shoulder length edema glove/sleeve, educated on donning/doffing, wear and care; continue table slides from [facility] therapist 11/8: AA/ROM in supine 11/15: wrist stretch into extension   GOALS: Goals reviewed with patient? Yes  SHORT TERM GOALS: Target date: 05/09/23  Pt will be provided with and educated on HEP to improve RUE functioning required for use during ADLs.   Goal status: IN PROGRESS  2.  Pt will increase P/ROM of right shoulder by 50+ degrees to improve ability to perform dressing and bathing tasks independently.   Goal status: IN PROGRESS  3.  Pt will increase A/ROM of right elbow to full extension to improve ability to perform low level functional reaching tasks.   Goal status: IN PROGRESS  4.  Pt will decrease edema in RUE by 50% or greater to improve mobility in RUE required for active participation in bathing and grooming tasks.   Goal status: IN PROGRESS  5.  Pt will demonstrate ability to make at least 50% of a  grasp to promote use when grasping large items such as a cup or water bottle.   Goal status: IN PROGRESS   LONG TERM GOALS: Target date: 06/09/23  Pt will decrease pain in the RUE to 2/10 or less to improve ability to sleep for 3+ consecutive hours without waking due to pain.   Goal status: IN PROGRESS  2.  Pt will increase RUE A/ROM to at least 120 degrees shoulder flexion/abduction, and wrist extension by 25+ degrees to improve use of RUE during meal preparation tasks.   Goal status: IN PROGRESS  3.  Pt will increase RUE strength to 4-/5 or greater to improve ability to use RUE during housework tasks.   Goal status: IN PROGRESS  4.  Pt will increase grip strength to at least 20# and pinch strength to at least 5# to improve ability to grasp and carry groceries.  Baseline:  Goal status: IN PROGRESS  5.  Pt will decrease RUE fascial restrictions to min amounts or less to improve ability to perform functional reaching tasks.   Goal status: IN PROGRESS   ASSESSMENT:  CLINICAL IMPRESSION: This session, pt continues to report no pain at rest, however, demonstrates increased grimacing and pain responses with manual therapy and active mobility. Her shoulder flexion and abduction continue to be limited, along with decreased elbow extension. Sessions continue to focus on improving overall motor control and ROM with good movement pattern. OT providing verbal and tactile cuing for positioning and technique throughout session.   PERFORMANCE DEFICITS: in functional skills including ADLs, IADLs, coordination, dexterity, proprioception, edema, ROM, strength, pain, fascial restrictions, Fine motor control, Gross motor control, and UE functional use    PLAN:  OT FREQUENCY: 3x/week  OT DURATION: 8 weeks  PLANNED INTERVENTIONS: 97168 OT Re-evaluation, 97535 self care/ADL training, 57846 therapeutic exercise, 97530 therapeutic activity, 97140 manual therapy, 97035 ultrasound, 97018 paraffin,  96295 moist heat, 97010 cryotherapy, 97032 electrical stimulation (manual), 97014 electrical stimulation unattended, 97760 Splinting (initial encounter), and DME and/or AE instructions  CONSULTED AND AGREED WITH PLAN OF CARE: Patient  PLAN FOR NEXT SESSION: retrograde massage and manual techniques, passive stretching progressing to AA/ROM and A/ROM  as able to tolerate. Follow up on HEP   Trish Mage, OTR/L [phone] 04/30/2023, 3:13 PM

## 2023-05-01 ENCOUNTER — Inpatient Hospital Stay: Payer: No Typology Code available for payment source

## 2023-05-01 VITALS — BP 120/85 | HR 101 | Temp 97.5°F | Resp 18

## 2023-05-01 DIAGNOSIS — R7989 Other specified abnormal findings of blood chemistry: Secondary | ICD-10-CM

## 2023-05-01 NOTE — Patient Instructions (Signed)
[facility] - A DEPT OF [facility]  Discharge Instructions: Thank you for choosing [facility] to provide your oncology and hematology care.  If you have a lab appointment with the Cancer Center - please note that after April 8th, 2024, all labs will be drawn in the cancer center.  You do not have to check in or register with the main entrance as you have in the past but will complete your check-in in the cancer center.  Wear comfortable clothing and clothing appropriate for easy access to any Portacath or PICC line.   We strive to give you quality time with your provider. You may need to reschedule your appointment if you arrive late (15 or more minutes).  Arriving late affects you and other patients whose appointments are after yours.  Also, if you miss three or more appointments without notifying the office, you may be dismissed from the clinic at the provider's discretion.      For prescription refill requests, have your pharmacy contact our office and allow 72 hours for refills to be completed.    Today you received the following therapeutic phlebotomy.     BELOW ARE SYMPTOMS THAT SHOULD BE REPORTED IMMEDIATELY: *FEVER GREATER THAN 100.4 F (38 C) OR HIGHER *CHILLS OR SWEATING *NAUSEA AND VOMITING THAT IS NOT CONTROLLED WITH YOUR NAUSEA MEDICATION *UNUSUAL SHORTNESS OF BREATH *UNUSUAL BRUISING OR BLEEDING *URINARY PROBLEMS (pain or burning when urinating, or frequent urination) *BOWEL PROBLEMS (unusual diarrhea, constipation, pain near the anus) TENDERNESS IN MOUTH AND THROAT WITH OR WITHOUT PRESENCE OF ULCERS (sore throat, sores in mouth, or a toothache) UNUSUAL RASH, SWELLING OR PAIN  UNUSUAL VAGINAL DISCHARGE OR ITCHING   Items with * indicate a potential emergency and should be followed up as soon as possible or go to the Emergency Department if any problems should occur.  Please show the CHEMOTHERAPY ALERT CARD or IMMUNOTHERAPY  ALERT CARD at check-in to the Emergency Department and triage nurse.  Should you have questions after your visit or need to cancel or reschedule your appointment, please contact [place] [facility] - A DEPT OF [facility] [phone]  and follow the prompts.  Office hours are 8:00 a.m. to 4:30 p.m. Monday - Friday. Please note that voicemails left after 4:00 p.m. may not be returned until the following business day.  We are closed weekends and major holidays. You have access to a nurse at all times for urgent questions. Please call the main number to the clinic [phone] and follow the prompts.  For any non-urgent questions, you may also contact your provider using MyChart. We now offer e-Visits for anyone 88 and older to request care online for non-urgent symptoms. For details visit [email or web address].   Also download the MyChart app! Go to the app store, search "MyChart", open the app, select , and log in with your MyChart username and password.

## 2023-05-01 NOTE — Progress Notes (Signed)
Stephanie Ramos presents today for theraputic phlebotomy per MD orders. Last hgb 14.6/ hct 46.3 was on 04/20/23. VSS prior to procedure. Pt reports eating before arrival. Procedure started at 1524 using patients left AC. 500 grams of blood removed. Procedure ended at 1530. Gauze and coban applied to [facility], site clean and dry. VSS upon completion of procedure. Pt denies c/o  lightheadedness, RN reclined patient back in the chair and started to fan patient. Patient observed for 30 minutes post procedure. Discharged in satisfactory condition with follow up instructions. No complaints voiced at discharge.

## 2023-05-02 ENCOUNTER — Encounter (HOSPITAL_COMMUNITY): Payer: Self-pay | Admitting: Occupational Therapy

## 2023-05-02 ENCOUNTER — Ambulatory Visit (HOSPITAL_COMMUNITY): Payer: No Typology Code available for payment source | Admitting: Occupational Therapy

## 2023-05-02 DIAGNOSIS — M25621 Stiffness of right elbow, not elsewhere classified: Secondary | ICD-10-CM

## 2023-05-02 DIAGNOSIS — M25511 Pain in right shoulder: Secondary | ICD-10-CM | POA: Diagnosis not present

## 2023-05-02 DIAGNOSIS — M25611 Stiffness of right shoulder, not elsewhere classified: Secondary | ICD-10-CM

## 2023-05-02 DIAGNOSIS — M25641 Stiffness of right hand, not elsewhere classified: Secondary | ICD-10-CM

## 2023-05-02 DIAGNOSIS — R29898 Other symptoms and signs involving the musculoskeletal system: Secondary | ICD-10-CM

## 2023-05-02 NOTE — Therapy (Signed)
OUTPATIENT OCCUPATIONAL THERAPY ORTHO TREATMENT  Patient Name: Stephanie Ramos MRN: [id number] DOB:Sep 24, 1945, 77 y.o., female Today's Date: 05/02/2023    END OF SESSION:  OT End of Session - 05/02/23 1232     Visit Number 10    Number of Visits 24    Date for OT Re-Evaluation 06/09/23    Authorization Type [employer], copay: $15    Authorization Time Period no auth required    OT Start Time 1150    OT Stop Time 1229    OT Time Calculation (min) 39 min    Activity Tolerance Patient tolerated treatment well    Behavior During Therapy WFL for tasks assessed/performed               Past Medical History:  Diagnosis Date   High iron content of liver determined by magnetic resonance imaging 08/02/2021   Hyperlipemia    Past Surgical History:  Procedure Laterality Date   BREAST SURGERY     CATARACT EXTRACTION W/PHACO Left 08/11/2019   Procedure: CATARACT EXTRACTION PHACO AND INTRAOCULAR LENS PLACEMENT (IOC) (CDE: 9.68);  Surgeon: [name], MD;  Location: [facility];  Service: Ophthalmology;  Laterality: Left;   CATARACT EXTRACTION W/PHACO Right 09/29/2019   Procedure: CATARACT EXTRACTION PHACO AND INTRAOCULAR LENS PLACEMENT RIGHT EYE;  Surgeon: [name], MD;  Location: [facility];  Service: Ophthalmology;  Laterality: Right;  CDE: 10.60   DILITATION & CURRETTAGE/HYSTROSCOPY WITH ESSURE     Patient Active Problem List   Diagnosis Date Noted   Comminuted fracture of right humerus 03/29/2023   Preventative health care 03/10/2022   Hypercalcemia 09/08/2021   High iron content of liver determined by magnetic resonance imaging 08/02/2021   Hyperkalemia 07/21/2021   Elevated ferritin level 06/24/2021   Fatigue 05/20/2021   Iron deficiency 01/18/2021   Elevated BP without diagnosis of hypertension 01/18/2021   Overweight with body mass index (BMI) of 26 to 26.9 in adult 03/18/2020   Refused influenza vaccine 03/18/2020   Vertigo 12/28/2017   Hyperlipemia  12/28/2017   PCP: Dr. Christel Mormon REFERRING PROVIDER: Dr. Teryl Lucy  ONSET DATE: 02/15/23  REFERRING DIAG: rt prox humerus fx with regional pain syndrome of right upper etremity   THERAPY DIAG:  Acute pain of right shoulder  Stiffness of right shoulder, not elsewhere classified  Stiffness of right elbow, not elsewhere classified  Stiffness of right hand, not elsewhere classified  Other symptoms and signs involving the musculoskeletal system  Rationale for Evaluation and Treatment: Rehabilitation  SUBJECTIVE:   SUBJECTIVE STATEMENT: S: I've been using this hand to pull up my pants.   PERTINENT HISTORY: Pt is a 77 y/o female presenting s/p right proximal humerus fracture after falling when cleaning up in the woods on 02/15/23. Pt attended therapy at [facility] approximately 4 times before transferring to this clinic.   PRECAUTIONS: Shoulder  WEIGHT BEARING RESTRICTIONS: Yes NWB  PAIN:  Are you having pain? No  FALLS: Has patient fallen in last 6 months? Yes. Number of falls 1  PLOF: Independent  PATIENT GOALS: To get the right arm moving again.   NEXT MD VISIT: 05/07/23  OBJECTIVE:  Note: Objective measures were completed at Evaluation unless otherwise noted.  HAND DOMINANCE: Right  ADLs: Overall ADLs: Pt is unable to use the RUE for ADLs. She can hold paper plate between her fingers. She is unable to make a fist or use her hand and arm. Pt is using the LUE for all tasks, using hemi-dressing techniques and strategies  for independence.    FUNCTIONAL OUTCOME MEASURES: Quick Dash: 72.73  UPPER EXTREMITY ROM:       Assessed in sitting, er/IR adducted  Passive ROM Right eval  Shoulder flexion 61  Shoulder abduction 54  Shoulder internal rotation 90  Shoulder external rotation 19  Elbow flexion 136  Elbow extension -30  Wrist flexion 55  Wrist extension 8  Wrist ulnar deviation 25  Wrist radial deviation 8  Wrist pronation 85  Wrist supination 85   (Blank rows = not tested)   Assessed in sitting, er/IR adducted  Active ROM Right eval  Shoulder flexion 33  Shoulder abduction 41  Shoulder internal rotation 90  Shoulder external rotation 20  Elbow flexion 128  Elbow extension -45  Wrist flexion 55  Wrist extension 8  Wrist ulnar deviation 25  Wrist radial deviation 4  Wrist pronation 70  Wrist supination 80  (Blank rows = not tested)   UPPER EXTREMITY MMT:       Unable to assess due to pain, limited active movement  MMT Right eval  Shoulder flexion   Shoulder abduction   Shoulder internal rotation   Shoulder external rotation   Elbow flexion   Elbow extension   Wrist flexion   Wrist extension   Wrist ulnar deviation   Wrist radial deviation   Wrist pronation   Wrist supination   (Blank rows = not tested)  HAND FUNCTION: Unable, TBD   COORDINATION: Unable, TBD  SENSATION: WFL  EDEMA:  -Right  -Left Upper arm    32cm 30.5cm Elbow           29cm 24cm Mid-forearm 20cm 20.5cm Wrist            17cm 14.5cm Palm            18.5cm 17cm  OBSERVATIONS: pt with generalized edema throughout RUE; joint stiffness in digits, wrist, elbow, and shoulder. Consistent with possibility of developing CRPS noting dx of regional pain syndrome of RUE   TODAY'S TREATMENT:                                                                                                                              DATE:  05/02/23 -Manual: retrograde massage and myofascial release to entire RUE to decrease stiffness and increase joint ROM in RUE -Fitted flexion glove to patient-size small -AA/ROM: supine-flexion, abduction, protraction, horizontal abduction, er/IR, 12 reps -AA/ROM: seated-flexion, abduction, protraction, horizontal abduction, er/IR, 10 reps -Pulleys: 1' flexion  04/30/23 -Manual: retrograde massage and myofascial release to entire RUE to decrease stiffness and increase joint ROM in RUE -AA/ROM: supine, flexion,  abduction, protraction, horizontal abduction, er/IR, x12 -A/ROM: elbow flexion/extension, wrist flexion/extension, supination/pronation, x15 -Low Level wall washes, 2x45" -Ball Rolls: flexion, abduction, x10  04/27/23 -Manual: retrograde massage and myofascial release to entire RUE to decrease stiffness and increase joint ROM in RUE -AA/ROM: supine, flexion, abduction, protraction, horizontal abduction, er/IR, x12 -A/ROM: elbow flexion/extension, wrist flexion/extension,  supination/pronation, x15 -Composite digit flexion/extension, 10 reps -Diagonal Pipe slides: flexion, abduction, x10 each  04/25/23 -Manual: retrograde massage and myofascial release to entire RUE to decrease stiffness and increase joint ROM in RUE -AA/ROM: supine-protraction, flexion, horizontal abduction, er, 10 reps -A/ROM: standing-elbow flexion, extension, 10 reps, 2 sets-bicep curl and hammer curl -A/ROM: seated-wrist extension, ulnar/radial deviation, 10 reps -Weighted stretch: wrist extension, 3#, 3x15" holds -Composite digit flexion/extension, 10 reps -Pulleys: 1' flexion    PATIENT EDUCATION: Education details: reviewed HEP Person educated: Patient Education method: Explanation, Demonstration, Tactile cues, and Verbal cues Education comprehension: verbalized understanding and returned demonstration  HOME EXERCISE PROGRAM: Eval: Provided shoulder length edema glove/sleeve, educated on donning/doffing, wear and care; continue table slides from [facility] therapist 11/8: AA/ROM in supine 11/15: wrist stretch into extension   GOALS: Goals reviewed with patient? Yes  SHORT TERM GOALS: Target date: 05/09/23  Pt will be provided with and educated on HEP to improve RUE functioning required for use during ADLs.   Goal status: IN PROGRESS  2.  Pt will increase P/ROM of right shoulder by 50+ degrees to improve ability to perform dressing and bathing tasks independently.   Goal status: IN PROGRESS  3.   Pt will increase A/ROM of right elbow to full extension to improve ability to perform low level functional reaching tasks.   Goal status: IN PROGRESS  4.  Pt will decrease edema in RUE by 50% or greater to improve mobility in RUE required for active participation in bathing and grooming tasks.   Goal status: IN PROGRESS  5.  Pt will demonstrate ability to make at least 50% of a grasp to promote use when grasping large items such as a cup or water bottle.   Goal status: IN PROGRESS   LONG TERM GOALS: Target date: 06/09/23  Pt will decrease pain in the RUE to 2/10 or less to improve ability to sleep for 3+ consecutive hours without waking due to pain.   Goal status: IN PROGRESS  2.  Pt will increase RUE A/ROM to at least 120 degrees shoulder flexion/abduction, and wrist extension by 25+ degrees to improve use of RUE during meal preparation tasks.   Goal status: IN PROGRESS  3.  Pt will increase RUE strength to 4-/5 or greater to improve ability to use RUE during housework tasks.   Goal status: IN PROGRESS  4.  Pt will increase grip strength to at least 20# and pinch strength to at least 5# to improve ability to grasp and carry groceries.  Baseline:  Goal status: IN PROGRESS  5.  Pt will decrease RUE fascial restrictions to min amounts or less to improve ability to perform functional reaching tasks.   Goal status: IN PROGRESS   ASSESSMENT:  CLINICAL IMPRESSION: Continued with manual techniques, did not complete P/ROM due to patient guarding, max cuing to relax RUE during manual techniques. Pt completing AA/ROM in supine and sitting. Provided and fitted flexion glove to patient today, minimal stretch on rubber bands for patient's tolerance. Added AA/ROM in sitting, pt achieving approximately 40-45% ROM. Verbal cuing for form and technique.   PERFORMANCE DEFICITS: in functional skills including ADLs, IADLs, coordination, dexterity, proprioception, edema, ROM, strength, pain, fascial  restrictions, Fine motor control, Gross motor control, and UE functional use    PLAN:  OT FREQUENCY: 3x/week  OT DURATION: 8 weeks  PLANNED INTERVENTIONS: 97168 OT Re-evaluation, 97535 self care/ADL training, 82956 therapeutic exercise, 97530 therapeutic activity, 97140 manual therapy, 97035 ultrasound, 97018 paraffin, 21308 moist heat,  97010 cryotherapy, [id number] electrical stimulation (manual), 40102 electrical stimulation unattended, 97760 Splinting (initial encounter), and DME and/or AE instructions  CONSULTED AND AGREED WITH PLAN OF CARE: Patient  PLAN FOR NEXT SESSION: retrograde massage and manual techniques, passive stretching progressing to AA/ROM and A/ROM as able to tolerate. Follow up on HEP; adjust flexion glove as able.    Ezra Sites, OTR/L  [phone] 05/02/2023, 12:32 PM

## 2023-05-07 ENCOUNTER — Encounter (HOSPITAL_COMMUNITY): Payer: Self-pay | Admitting: Occupational Therapy

## 2023-05-07 ENCOUNTER — Ambulatory Visit (HOSPITAL_COMMUNITY): Payer: No Typology Code available for payment source | Attending: Orthopedic Surgery | Admitting: Occupational Therapy

## 2023-05-07 DIAGNOSIS — M25611 Stiffness of right shoulder, not elsewhere classified: Secondary | ICD-10-CM | POA: Insufficient documentation

## 2023-05-07 DIAGNOSIS — M25641 Stiffness of right hand, not elsewhere classified: Secondary | ICD-10-CM | POA: Insufficient documentation

## 2023-05-07 DIAGNOSIS — M25511 Pain in right shoulder: Secondary | ICD-10-CM | POA: Insufficient documentation

## 2023-05-07 DIAGNOSIS — S42294D Other nondisplaced fracture of upper end of right humerus, subsequent encounter for fracture with routine healing: Secondary | ICD-10-CM | POA: Diagnosis not present

## 2023-05-07 DIAGNOSIS — M25621 Stiffness of right elbow, not elsewhere classified: Secondary | ICD-10-CM | POA: Diagnosis not present

## 2023-05-07 DIAGNOSIS — R29898 Other symptoms and signs involving the musculoskeletal system: Secondary | ICD-10-CM | POA: Diagnosis not present

## 2023-05-07 NOTE — Therapy (Signed)
OUTPATIENT OCCUPATIONAL THERAPY ORTHO TREATMENT  Patient Name: Stephanie Ramos MRN: [id number] DOB:[birth year], 77 y.o., female Today's Date: 05/07/2023    END OF SESSION:  OT End of Session - 05/07/23 1053     Visit Number 11    Number of Visits 24    Date for OT Re-Evaluation 06/09/23    Authorization Type [employer], copay: $15    Authorization Time Period no auth required    OT Start Time 1017    OT Stop Time 1058    OT Time Calculation (min) 41 min    Activity Tolerance Patient tolerated treatment well    Behavior During Therapy WFL for tasks assessed/performed                Past Medical History:  Diagnosis Date   High iron content of liver determined by magnetic resonance imaging 08/02/2021   Hyperlipemia    Past Surgical History:  Procedure Laterality Date   BREAST SURGERY     CATARACT EXTRACTION W/PHACO Left 08/11/2019   Procedure: CATARACT EXTRACTION PHACO AND INTRAOCULAR LENS PLACEMENT (IOC) (CDE: 9.68);  Surgeon: [name], MD;  Location: [facility];  Service: Ophthalmology;  Laterality: Left;   CATARACT EXTRACTION W/PHACO Right 09/29/2019   Procedure: CATARACT EXTRACTION PHACO AND INTRAOCULAR LENS PLACEMENT RIGHT EYE;  Surgeon: [name], MD;  Location: [facility];  Service: Ophthalmology;  Laterality: Right;  CDE: 10.60   DILITATION & CURRETTAGE/HYSTROSCOPY WITH ESSURE     Patient Active Problem List   Diagnosis Date Noted   Comminuted fracture of right humerus 03/29/2023   Preventative health care 03/10/2022   Hypercalcemia 09/08/2021   High iron content of liver determined by magnetic resonance imaging 08/02/2021   Hyperkalemia 07/21/2021   Elevated ferritin level 06/24/2021   Fatigue 05/20/2021   Iron deficiency 01/18/2021   Elevated BP without diagnosis of hypertension 01/18/2021   Overweight with body mass index (BMI) of 26 to 26.9 in adult 03/18/2020   Refused influenza vaccine 03/18/2020   Vertigo 12/28/2017   Hyperlipemia  12/28/2017   PCP: Dr. Christel Mormon REFERRING PROVIDER: Dr. Teryl Lucy  ONSET DATE: 02/15/23  REFERRING DIAG: rt prox humerus fx with regional pain syndrome of right upper etremity   THERAPY DIAG:  Acute pain of right shoulder  Stiffness of right shoulder, not elsewhere classified  Stiffness of right elbow, not elsewhere classified  Stiffness of right hand, not elsewhere classified  Other symptoms and signs involving the musculoskeletal system  Rationale for Evaluation and Treatment: Rehabilitation  SUBJECTIVE:   SUBJECTIVE STATEMENT: S: I wore the glove 15 minutes, 6 times the first day.   PERTINENT HISTORY: Pt is a 77 y/o female presenting s/p right proximal humerus fracture after falling when cleaning up in the woods on 02/15/23. Pt attended therapy at [facility] approximately 4 times before transferring to this clinic.   PRECAUTIONS: Shoulder  WEIGHT BEARING RESTRICTIONS: Yes NWB  PAIN:  Are you having pain? No  FALLS: Has patient fallen in last 6 months? Yes. Number of falls 1  PLOF: Independent  PATIENT GOALS: To get the right arm moving again.   NEXT MD VISIT: 05/07/23  OBJECTIVE:  Note: Objective measures were completed at Evaluation unless otherwise noted.  HAND DOMINANCE: Right  ADLs: Overall ADLs: Pt is unable to use the RUE for ADLs. She can hold paper plate between her fingers. She is unable to make a fist or use her hand and arm. Pt is using the LUE for all tasks, using hemi-dressing techniques  and strategies for independence.    FUNCTIONAL OUTCOME MEASURES: Quick Dash: 72.73  UPPER EXTREMITY ROM:       Assessed in sitting, er/IR adducted  Passive ROM Right eval Right 05/07/23  Shoulder flexion 61 84  Shoulder abduction 54 66  Shoulder internal rotation 90 90  Shoulder external rotation 19 28  Elbow flexion 136 145  Elbow extension -30 -5  Wrist flexion 55 70  Wrist extension 8 22  Wrist ulnar deviation 25   Wrist radial  deviation 8 11  Wrist pronation 85 90  Wrist supination 85 90  (Blank rows = not tested)   Assessed in sitting, er/IR adducted  Active ROM Right eval Right 05/07/23  Shoulder flexion 33 68  Shoulder abduction 41 56  Shoulder internal rotation 90 90  Shoulder external rotation 20 34  Elbow flexion 128 142  Elbow extension -45 -10  Wrist flexion 55 60  Wrist extension 8 12  Wrist ulnar deviation 25   Wrist radial deviation 4 5  Wrist pronation 70 90  Wrist supination 80 88  (Blank rows = not tested)      Active ROM Right 05/07/23  Thumb MCP (0-60) 34  Thumb IP (0-80) 12  Index MCP (0-90) 50  Index PIP (0-100) 52  Index DIP (0-70)  12  Long MCP (0-90)  42  Long PIP (0-100)  40  Long DIP (0-70)  0  Ring MCP (0-90)  30  Ring PIP (0-100)  34  Ring DIP (0-70)  0  Little MCP (0-90)  32  Little PIP (0-100)  36  Little DIP (0-70)  0  (Blank rows = not tested)  UPPER EXTREMITY MMT:       Unable to assess due to pain, limited active movement   12/2: shoulder evaluated on observation; MMT for elbow and wrist  MMT Right eval  Shoulder flexion 3-/5  Shoulder abduction 3-/5  Shoulder internal rotation 3/5  Shoulder external rotation 3/5  Elbow flexion 4/5  Elbow extension 4-/5  Wrist flexion 3+/5  Wrist extension 3+/5  Wrist ulnar deviation 4/5  Wrist radial deviation 3+/5  Wrist pronation 4-/5  Wrist supination 4-/5  (Blank rows = not tested)  HAND FUNCTION: Unable, TBD   COORDINATION: Unable, TBD  SENSATION: WFL  EDEMA:  -Right  -Left Upper arm    32cm 30.5cm Elbow           29cm 24cm Mid-forearm 20cm 20.5cm Wrist            17cm 14.5cm Palm            18.5cm 17cm  -Right   Upper arm    31cm  Elbow           25.5cm Mid-forearm 20cm  Wrist            15.5cm  Palm            17.5cm   OBSERVATIONS: pt with generalized edema throughout RUE; joint stiffness in digits, wrist, elbow, and shoulder. Consistent with possibility of developing CRPS noting  dx of regional pain syndrome of RUE   TODAY'S TREATMENT:  DATE:  05/07/23 -Manual: retrograde massage and myofascial release to entire RUE to decrease stiffness and increase joint ROM in RUE -AA/ROM: seated-flexion, abduction, protraction, horizontal abduction, er/IR, 10 reps -Pulleys: 1' flexion  05/02/23 -Manual: retrograde massage and myofascial release to entire RUE to decrease stiffness and increase joint ROM in RUE -Fitted flexion glove to patient-size small -AA/ROM: supine-flexion, abduction, protraction, horizontal abduction, er/IR, 12 reps -AA/ROM: seated-flexion, abduction, protraction, horizontal abduction, er/IR, 10 reps -Pulleys: 1' flexion  04/30/23 -Manual: retrograde massage and myofascial release to entire RUE to decrease stiffness and increase joint ROM in RUE -AA/ROM: supine, flexion, abduction, protraction, horizontal abduction, er/IR, x12 -A/ROM: elbow flexion/extension, wrist flexion/extension, supination/pronation, x15 -Low Level wall washes, 2x45" -Ball Rolls: flexion, abduction, x10     PATIENT EDUCATION: Education details: reviewed HEP Person educated: Patient Education method: Programmer, multimedia, Demonstration, Tactile cues, and Verbal cues Education comprehension: verbalized understanding and returned demonstration  HOME EXERCISE PROGRAM: Eval: Provided shoulder length edema glove/sleeve, educated on donning/doffing, wear and care; continue table slides from [facility] therapist 11/8: AA/ROM in supine 11/15: wrist stretch into extension  11/27: flexion glove wear  GOALS: Goals reviewed with patient? Yes  SHORT TERM GOALS: Target date: 05/09/23  Pt will be provided with and educated on HEP to improve RUE functioning required for use during ADLs.   Goal status: IN PROGRESS  2.  Pt will increase P/ROM of right shoulder by 50+  degrees to improve ability to perform dressing and bathing tasks independently.   Goal status: IN PROGRESS  3.  Pt will increase A/ROM of right elbow to full extension to improve ability to perform low level functional reaching tasks.   Goal status: IN PROGRESS  4.  Pt will decrease edema in RUE by 50% or greater to improve mobility in RUE required for active participation in bathing and grooming tasks.   Goal status: IN PROGRESS  5.  Pt will demonstrate ability to make at least 50% of a grasp to promote use when grasping large items such as a cup or water bottle.   Goal status: IN PROGRESS   LONG TERM GOALS: Target date: 06/09/23  Pt will decrease pain in the RUE to 2/10 or less to improve ability to sleep for 3+ consecutive hours without waking due to pain.   Goal status: IN PROGRESS  2.  Pt will increase RUE A/ROM to at least 120 degrees shoulder flexion/abduction, and wrist extension by 25+ degrees to improve use of RUE during meal preparation tasks.   Goal status: IN PROGRESS  3.  Pt will increase RUE strength to 4-/5 or greater to improve ability to use RUE during housework tasks.   Goal status: IN PROGRESS  4.  Pt will increase grip strength to at least 20# and pinch strength to at least 5# to improve ability to grasp and carry groceries.  Baseline:  Goal status: IN PROGRESS  5.  Pt will decrease RUE fascial restrictions to min amounts or less to improve ability to perform functional reaching tasks.   Goal status: IN PROGRESS   ASSESSMENT:  CLINICAL IMPRESSION: Measurements taken for MD appt today. Pt is making good progress and demonstrates improved ROM in all plans. Able to complete MMT with exception of shoulder today. Continued with manual techniques and AA/ROM. Pt reports she is wearing the flexion glove multiple times per day, also measured digit ROM for first time today. Pt continues to have guarding and requires consistent encouragement to relax her arm and  hand. Verbal and visual  cuing for form and technique.   PERFORMANCE DEFICITS: in functional skills including ADLs, IADLs, coordination, dexterity, proprioception, edema, ROM, strength, pain, fascial restrictions, Fine motor control, Gross motor control, and UE functional use    PLAN:  OT FREQUENCY: 3x/week  OT DURATION: 8 weeks  PLANNED INTERVENTIONS: 97168 OT Re-evaluation, 97535 self care/ADL training, 16109 therapeutic exercise, 97530 therapeutic activity, 97140 manual therapy, 97035 ultrasound, 97018 paraffin, 60454 moist heat, 97010 cryotherapy, 97032 electrical stimulation (manual), 97014 electrical stimulation unattended, 97760 Splinting (initial encounter), and DME and/or AE instructions  CONSULTED AND AGREED WITH PLAN OF CARE: Patient  PLAN FOR NEXT SESSION: retrograde massage and manual techniques, passive stretching progressing to AA/ROM and A/ROM as able to tolerate. Follow up on HEP; adjust flexion glove as able.    Ezra Sites, OTR/L  [phone] 05/07/2023, 12:01 PM

## 2023-05-09 ENCOUNTER — Ambulatory Visit (HOSPITAL_COMMUNITY): Payer: No Typology Code available for payment source | Admitting: Occupational Therapy

## 2023-05-09 ENCOUNTER — Encounter (HOSPITAL_COMMUNITY): Payer: Self-pay | Admitting: Occupational Therapy

## 2023-05-09 DIAGNOSIS — M25641 Stiffness of right hand, not elsewhere classified: Secondary | ICD-10-CM

## 2023-05-09 DIAGNOSIS — M25511 Pain in right shoulder: Secondary | ICD-10-CM | POA: Diagnosis not present

## 2023-05-09 DIAGNOSIS — R29898 Other symptoms and signs involving the musculoskeletal system: Secondary | ICD-10-CM

## 2023-05-09 DIAGNOSIS — M25611 Stiffness of right shoulder, not elsewhere classified: Secondary | ICD-10-CM

## 2023-05-09 DIAGNOSIS — M25621 Stiffness of right elbow, not elsewhere classified: Secondary | ICD-10-CM

## 2023-05-09 NOTE — Therapy (Signed)
OUTPATIENT OCCUPATIONAL THERAPY ORTHO TREATMENT  Patient Name: Stephanie Ramos MRN: [id number] DOB:1946-04-21, 77 y.o., female Today's Date: 05/09/2023    END OF SESSION:  OT End of Session - 05/09/23 1230     Visit Number 12    Number of Visits 24    Date for OT Re-Evaluation 06/09/23    Authorization Type [employer], copay: $15    Authorization Time Period no auth required    OT Start Time 1150    OT Stop Time 1229    OT Time Calculation (min) 39 min    Activity Tolerance Patient tolerated treatment well    Behavior During Therapy WFL for tasks assessed/performed                 Past Medical History:  Diagnosis Date   High iron content of liver determined by magnetic resonance imaging 08/02/2021   Hyperlipemia    Past Surgical History:  Procedure Laterality Date   BREAST SURGERY     CATARACT EXTRACTION W/PHACO Left 08/11/2019   Procedure: CATARACT EXTRACTION PHACO AND INTRAOCULAR LENS PLACEMENT (IOC) (CDE: 9.68);  Surgeon: [name], MD;  Location: [facility];  Service: Ophthalmology;  Laterality: Left;   CATARACT EXTRACTION W/PHACO Right 09/29/2019   Procedure: CATARACT EXTRACTION PHACO AND INTRAOCULAR LENS PLACEMENT RIGHT EYE;  Surgeon: [name], MD;  Location: [facility];  Service: Ophthalmology;  Laterality: Right;  CDE: 10.60   DILITATION & CURRETTAGE/HYSTROSCOPY WITH ESSURE     Patient Active Problem List   Diagnosis Date Noted   Comminuted fracture of right humerus 03/29/2023   Preventative health care 03/10/2022   Hypercalcemia 09/08/2021   High iron content of liver determined by magnetic resonance imaging 08/02/2021   Hyperkalemia 07/21/2021   Elevated ferritin level 06/24/2021   Fatigue 05/20/2021   Iron deficiency 01/18/2021   Elevated BP without diagnosis of hypertension 01/18/2021   Overweight with body mass index (BMI) of 26 to 26.9 in adult 03/18/2020   Refused influenza vaccine 03/18/2020   Vertigo 12/28/2017   Hyperlipemia  12/28/2017   PCP: Dr. Christel Mormon REFERRING PROVIDER: Dr. Teryl Lucy  ONSET DATE: 02/15/23  REFERRING DIAG: rt prox humerus fx with regional pain syndrome of right upper etremity   THERAPY DIAG:  Acute pain of right shoulder  Stiffness of right shoulder, not elsewhere classified  Stiffness of right elbow, not elsewhere classified  Stiffness of right hand, not elsewhere classified  Other symptoms and signs involving the musculoskeletal system  Rationale for Evaluation and Treatment: Rehabilitation  SUBJECTIVE:   SUBJECTIVE STATEMENT: S: The doctor said it's healing but it's not there yet.   PERTINENT HISTORY: Pt is a 77 y/o female presenting s/p right proximal humerus fracture after falling when cleaning up in the woods on 02/15/23. Pt attended therapy at [facility] approximately 4 times before transferring to this clinic.   PRECAUTIONS: Shoulder  WEIGHT BEARING RESTRICTIONS: Yes NWB  PAIN:  Are you having pain? Yes: NPRS scale: 2/10 Pain location: right shoulder Pain description: sore Aggravating factors: weather, use Relieving factors: rest  FALLS: Has patient fallen in last 6 months? Yes. Number of falls 1  PLOF: Independent  PATIENT GOALS: To get the right arm moving again.   NEXT MD VISIT: 06/11/23  OBJECTIVE:  Note: Objective measures were completed at Evaluation unless otherwise noted.  HAND DOMINANCE: Right  ADLs: Overall ADLs: Pt is unable to use the RUE for ADLs. She can hold paper plate between her fingers. She is unable to make a fist  or use her hand and arm. Pt is using the LUE for all tasks, using hemi-dressing techniques and strategies for independence.    FUNCTIONAL OUTCOME MEASURES: Quick Dash: 72.73  UPPER EXTREMITY ROM:       Assessed in sitting, er/IR adducted  Passive ROM Right eval Right 05/07/23  Shoulder flexion 61 84  Shoulder abduction 54 66  Shoulder internal rotation 90 90  Shoulder external rotation 19 28  Elbow  flexion 136 145  Elbow extension -30 -5  Wrist flexion 55 70  Wrist extension 8 22  Wrist ulnar deviation 25   Wrist radial deviation 8 11  Wrist pronation 85 90  Wrist supination 85 90  (Blank rows = not tested)   Assessed in sitting, er/IR adducted  Active ROM Right eval Right 05/07/23  Shoulder flexion 33 68  Shoulder abduction 41 56  Shoulder internal rotation 90 90  Shoulder external rotation 20 34  Elbow flexion 128 142  Elbow extension -45 -10  Wrist flexion 55 60  Wrist extension 8 12  Wrist ulnar deviation 25   Wrist radial deviation 4 5  Wrist pronation 70 90  Wrist supination 80 88  (Blank rows = not tested)      Active ROM Right 05/07/23  Thumb MCP (0-60) 34  Thumb IP (0-80) 12  Index MCP (0-90) 50  Index PIP (0-100) 52  Index DIP (0-70)  12  Long MCP (0-90)  42  Long PIP (0-100)  40  Long DIP (0-70)  0  Ring MCP (0-90)  30  Ring PIP (0-100)  34  Ring DIP (0-70)  0  Little MCP (0-90)  32  Little PIP (0-100)  36  Little DIP (0-70)  0  (Blank rows = not tested)  UPPER EXTREMITY MMT:       Unable to assess due to pain, limited active movement   12/2: shoulder evaluated on observation; MMT for elbow and wrist  MMT Right eval  Shoulder flexion 3-/5  Shoulder abduction 3-/5  Shoulder internal rotation 3/5  Shoulder external rotation 3/5  Elbow flexion 4/5  Elbow extension 4-/5  Wrist flexion 3+/5  Wrist extension 3+/5  Wrist ulnar deviation 4/5  Wrist radial deviation 3+/5  Wrist pronation 4-/5  Wrist supination 4-/5  (Blank rows = not tested)  HAND FUNCTION: Unable, TBD   COORDINATION: Unable, TBD  SENSATION: WFL  EDEMA:  -Right  -Left Upper arm    32cm 30.5cm Elbow           29cm 24cm Mid-forearm 20cm 20.5cm Wrist            17cm 14.5cm Palm            18.5cm 17cm  -Right   Upper arm    31cm  Elbow           25.5cm Mid-forearm 20cm  Wrist            15.5cm  Palm            17.5cm   OBSERVATIONS: pt with generalized  edema throughout RUE; joint stiffness in digits, wrist, elbow, and shoulder. Consistent with possibility of developing CRPS noting dx of regional pain syndrome of RUE   TODAY'S TREATMENT:  DATE:   05/09/23 -Manual: retrograde massage and myofascial release to entire RUE to decrease stiffness and increase joint ROM in RUE -P/ROM: right digits, wrist flexion/extension, ulnar/radial deviation, 5 reps -AA/ROM: supine-flexion, abduction, protraction, horizontal abduction, er/IR, 15 reps -Wall wash: 1' flexion, low level -Functional reaching: pt placing 10 cones on bottom shelf of overhead cabinet in flexion, then removing -Cone pass behind back, 10 reps -UBE: level 1, 2' forward, 2' reverse, pace: 6.0   05/07/23 -Manual: retrograde massage and myofascial release to entire RUE to decrease stiffness and increase joint ROM in RUE -AA/ROM: seated-flexion, abduction, protraction, horizontal abduction, er/IR, 10 reps -Pulleys: 1' flexion  05/02/23 -Manual: retrograde massage and myofascial release to entire RUE to decrease stiffness and increase joint ROM in RUE -Fitted flexion glove to patient-size small -AA/ROM: supine-flexion, abduction, protraction, horizontal abduction, er/IR, 12 reps -AA/ROM: seated-flexion, abduction, protraction, horizontal abduction, er/IR, 10 reps -Pulleys: 1' flexion    PATIENT EDUCATION: Education details: reviewed HEP Person educated: Patient Education method: Explanation, Demonstration, Tactile cues, and Verbal cues Education comprehension: verbalized understanding and returned demonstration  HOME EXERCISE PROGRAM: Eval: Provided shoulder length edema glove/sleeve, educated on donning/doffing, wear and care; continue table slides from [facility] therapist 11/8: AA/ROM in supine 11/15: wrist stretch into extension  11/27: flexion glove  wear  GOALS: Goals reviewed with patient? Yes  SHORT TERM GOALS: Target date: 05/09/23  Pt will be provided with and educated on HEP to improve RUE functioning required for use during ADLs.   Goal status: IN PROGRESS  2.  Pt will increase P/ROM of right shoulder by 50+ degrees to improve ability to perform dressing and bathing tasks independently.   Goal status: IN PROGRESS  3.  Pt will increase A/ROM of right elbow to full extension to improve ability to perform low level functional reaching tasks.   Goal status: IN PROGRESS  4.  Pt will decrease edema in RUE by 50% or greater to improve mobility in RUE required for active participation in bathing and grooming tasks.   Goal status: IN PROGRESS  5.  Pt will demonstrate ability to make at least 50% of a grasp to promote use when grasping large items such as a cup or water bottle.   Goal status: IN PROGRESS   LONG TERM GOALS: Target date: 06/09/23  Pt will decrease pain in the RUE to 2/10 or less to improve ability to sleep for 3+ consecutive hours without waking due to pain.   Goal status: IN PROGRESS  2.  Pt will increase RUE A/ROM to at least 120 degrees shoulder flexion/abduction, and wrist extension by 25+ degrees to improve use of RUE during meal preparation tasks.   Goal status: IN PROGRESS  3.  Pt will increase RUE strength to 4-/5 or greater to improve ability to use RUE during housework tasks.   Goal status: IN PROGRESS  4.  Pt will increase grip strength to at least 20# and pinch strength to at least 5# to improve ability to grasp and carry groceries.  Baseline:  Goal status: IN PROGRESS  5.  Pt will decrease RUE fascial restrictions to min amounts or less to improve ability to perform functional reaching tasks.   Goal status: IN PROGRESS   ASSESSMENT:  CLINICAL IMPRESSION: Pt reports MD said she still has a way to go but is improving. Pt with mild soreness today. Continued with manual techniques,  stretching to digits. Pt is wearing flexion glove for 30 minutes 3x/day, OT noting slow progress with digit  ROM. Continued with AA/ROM for shoulder, added functional reaching and UBE today. Pt did great with functional reaching. Verbal cuing for form and technique during tasks. Pt continues to have guarding and ROM deficits throughout the RUE.   PERFORMANCE DEFICITS: in functional skills including ADLs, IADLs, coordination, dexterity, proprioception, edema, ROM, strength, pain, fascial restrictions, Fine motor control, Gross motor control, and UE functional use    PLAN:  OT FREQUENCY: 3x/week  OT DURATION: 8 weeks  PLANNED INTERVENTIONS: 97168 OT Re-evaluation, 97535 self care/ADL training, 51884 therapeutic exercise, 97530 therapeutic activity, 97140 manual therapy, 97035 ultrasound, 97018 paraffin, 16606 moist heat, 97010 cryotherapy, 97032 electrical stimulation (manual), 97014 electrical stimulation unattended, 97760 Splinting (initial encounter), and DME and/or AE instructions  CONSULTED AND AGREED WITH PLAN OF CARE: Patient  PLAN FOR NEXT SESSION: retrograde massage and manual techniques, passive stretching progressing to AA/ROM and A/ROM as able to tolerate. Follow up on HEP; adjust flexion glove as able.    Ezra Sites, OTR/L  [phone] 05/09/2023, 12:30 PM

## 2023-05-11 ENCOUNTER — Encounter (HOSPITAL_COMMUNITY): Payer: Self-pay | Admitting: Occupational Therapy

## 2023-05-11 ENCOUNTER — Ambulatory Visit (HOSPITAL_COMMUNITY): Payer: No Typology Code available for payment source | Admitting: Occupational Therapy

## 2023-05-11 DIAGNOSIS — M25621 Stiffness of right elbow, not elsewhere classified: Secondary | ICD-10-CM

## 2023-05-11 DIAGNOSIS — M25641 Stiffness of right hand, not elsewhere classified: Secondary | ICD-10-CM

## 2023-05-11 DIAGNOSIS — M25611 Stiffness of right shoulder, not elsewhere classified: Secondary | ICD-10-CM

## 2023-05-11 DIAGNOSIS — M25511 Pain in right shoulder: Secondary | ICD-10-CM

## 2023-05-11 DIAGNOSIS — R29898 Other symptoms and signs involving the musculoskeletal system: Secondary | ICD-10-CM

## 2023-05-11 NOTE — Therapy (Unsigned)
OUTPATIENT OCCUPATIONAL THERAPY ORTHO TREATMENT  Patient Name: Stephanie Ramos MRN: [id number] DOB:1946/03/16, 77 y.o., female Today's Date: 05/11/2023    END OF SESSION:        Past Medical History:  Diagnosis Date   High iron content of liver determined by magnetic resonance imaging 08/02/2021   Hyperlipemia    Past Surgical History:  Procedure Laterality Date   BREAST SURGERY     CATARACT EXTRACTION W/PHACO Left 08/11/2019   Procedure: CATARACT EXTRACTION PHACO AND INTRAOCULAR LENS PLACEMENT (IOC) (CDE: 9.68);  Surgeon: [name], MD;  Location: [facility];  Service: Ophthalmology;  Laterality: Left;   CATARACT EXTRACTION W/PHACO Right 09/29/2019   Procedure: CATARACT EXTRACTION PHACO AND INTRAOCULAR LENS PLACEMENT RIGHT EYE;  Surgeon: [name], MD;  Location: [facility];  Service: Ophthalmology;  Laterality: Right;  CDE: 10.60   DILITATION & CURRETTAGE/HYSTROSCOPY WITH ESSURE     Patient Active Problem List   Diagnosis Date Noted   Comminuted fracture of right humerus 03/29/2023   Preventative health care 03/10/2022   Hypercalcemia 09/08/2021   High iron content of liver determined by magnetic resonance imaging 08/02/2021   Hyperkalemia 07/21/2021   Elevated ferritin level 06/24/2021   Fatigue 05/20/2021   Iron deficiency 01/18/2021   Elevated BP without diagnosis of hypertension 01/18/2021   Overweight with body mass index (BMI) of 26 to 26.9 in adult 03/18/2020   Refused influenza vaccine 03/18/2020   Vertigo 12/28/2017   Hyperlipemia 12/28/2017   PCP: Dr. Christel Mormon REFERRING PROVIDER: Dr. Teryl Lucy  ONSET DATE: 02/15/23  REFERRING DIAG: rt prox humerus fx with regional pain syndrome of right upper etremity   THERAPY DIAG:  No diagnosis found.  Rationale for Evaluation and Treatment: Rehabilitation  SUBJECTIVE:   SUBJECTIVE STATEMENT: S: The doctor said it's healing but it's not there yet.   PERTINENT HISTORY: Pt is a 77 y/o female  presenting s/p right proximal humerus fracture after falling when [place] up in the woods on 02/15/23. Pt attended therapy at [facility] approximately 4 times before transferring to this clinic.   PRECAUTIONS: Shoulder  WEIGHT BEARING RESTRICTIONS: Yes NWB  PAIN:  Are you having pain? Yes: NPRS scale: 2/10 Pain location: right shoulder Pain description: sore Aggravating factors: weather, use Relieving factors: rest  FALLS: Has patient fallen in last 6 months? Yes. Number of falls 1  PLOF: Independent  PATIENT GOALS: To get the right arm moving again.   NEXT MD VISIT: 06/11/23  OBJECTIVE:  Note: Objective measures were completed at Evaluation unless otherwise noted.  HAND DOMINANCE: Right  ADLs: Overall ADLs: Pt is unable to use the RUE for ADLs. She can hold paper plate between her fingers. She is unable to make a fist or use her hand and arm. Pt is using the LUE for all tasks, using hemi-dressing techniques and strategies for independence.    FUNCTIONAL OUTCOME MEASURES: Quick Dash: 72.73  UPPER EXTREMITY ROM:       Assessed in sitting, er/IR adducted  Passive ROM Right eval Right 05/07/23  Shoulder flexion 61 84  Shoulder abduction 54 66  Shoulder internal rotation 90 90  Shoulder external rotation 19 28  Elbow flexion 136 145  Elbow extension -30 -5  Wrist flexion 55 70  Wrist extension 8 22  Wrist ulnar deviation 25   Wrist radial deviation 8 11  Wrist pronation 85 90  Wrist supination 85 90  (Blank rows = not tested)   Assessed in sitting, er/IR adducted  Active ROM Right eval  Right 05/07/23  Shoulder flexion 33 68  Shoulder abduction 41 56  Shoulder internal rotation 90 90  Shoulder external rotation 20 34  Elbow flexion 128 142  Elbow extension -45 -10  Wrist flexion 55 60  Wrist extension 8 12  Wrist ulnar deviation 25   Wrist radial deviation 4 5  Wrist pronation 70 90  Wrist supination 80 88  (Blank rows = not tested)      Active  ROM Right 05/07/23  Thumb MCP (0-60) 34  Thumb IP (0-80) 12  Index MCP (0-90) 50  Index PIP (0-100) 52  Index DIP (0-70)  12  Long MCP (0-90)  42  Long PIP (0-100)  40  Long DIP (0-70)  0  Ring MCP (0-90)  30  Ring PIP (0-100)  34  Ring DIP (0-70)  0  Little MCP (0-90)  32  Little PIP (0-100)  36  Little DIP (0-70)  0  (Blank rows = not tested)  UPPER EXTREMITY MMT:       Unable to assess due to pain, limited active movement   12/2: shoulder evaluated on observation; MMT for elbow and wrist  MMT Right eval  Shoulder flexion 3-/5  Shoulder abduction 3-/5  Shoulder internal rotation 3/5  Shoulder external rotation 3/5  Elbow flexion 4/5  Elbow extension 4-/5  Wrist flexion 3+/5  Wrist extension 3+/5  Wrist ulnar deviation 4/5  Wrist radial deviation 3+/5  Wrist pronation 4-/5  Wrist supination 4-/5  (Blank rows = not tested)  HAND FUNCTION: Unable, TBD   COORDINATION: Unable, TBD  SENSATION: WFL  EDEMA:  -Right  -Left Upper arm    32cm 30.5cm Elbow           29cm 24cm Mid-forearm 20cm 20.5cm Wrist            17cm 14.5cm Palm            18.5cm 17cm  -Right   Upper arm    31cm  Elbow           25.5cm Mid-forearm 20cm  Wrist            15.5cm  Palm            17.5cm   OBSERVATIONS: pt with generalized edema throughout RUE; joint stiffness in digits, wrist, elbow, and shoulder. Consistent with possibility of developing CRPS noting dx of regional pain syndrome of RUE   TODAY'S TREATMENT:                                                                                                                              DATE:   05/11/23 -Manual: retrograde massage and myofascial release to entire RUE to decrease stiffness and increase joint ROM in RUE -P/ROM: right digits, wrist flexion/extension, ulnar/radial deviation, 5 reps -AA/ROM: supine-flexion, abduction, protraction, horizontal abduction, er/IR, 15 reps -Functional Reaching: pinch tree, yellow, red  green, resistance clips x7 each, reaching up to place  on vertical pole, then bringing back down to horizontal pole  05/09/23 -Manual: retrograde massage and myofascial release to entire RUE to decrease stiffness and increase joint ROM in RUE -P/ROM: right digits, wrist flexion/extension, ulnar/radial deviation, 5 reps -AA/ROM: supine-flexion, abduction, protraction, horizontal abduction, er/IR, 15 reps -Wall wash: 1' flexion, low level -Functional reaching: pt placing 10 cones on bottom shelf of overhead cabinet in flexion, then removing -Cone pass behind back, 10 reps -UBE: level 1, 2' forward, 2' reverse, pace: 6.0   05/07/23 -Manual: retrograde massage and myofascial release to entire RUE to decrease stiffness and increase joint ROM in RUE -AA/ROM: seated-flexion, abduction, protraction, horizontal abduction, er/IR, 10 reps -Pulleys: 1' flexion   PATIENT EDUCATION: Education details: reviewed HEP Person educated: Patient Education method: Explanation, Demonstration, Tactile cues, and Verbal cues Education comprehension: verbalized understanding and returned demonstration  HOME EXERCISE PROGRAM: Eval: Provided shoulder length edema glove/sleeve, educated on donning/doffing, wear and care; continue table slides from [facility] therapist 11/8: AA/ROM in supine 11/15: wrist stretch into extension  11/27: flexion glove wear  GOALS: Goals reviewed with patient? Yes  SHORT TERM GOALS: Target date: 05/09/23  Pt will be provided with and educated on HEP to improve RUE functioning required for use during ADLs.   Goal status: IN PROGRESS  2.  Pt will increase P/ROM of right shoulder by 50+ degrees to improve ability to perform dressing and bathing tasks independently.   Goal status: IN PROGRESS  3.  Pt will increase A/ROM of right elbow to full extension to improve ability to perform low level functional reaching tasks.   Goal status: IN PROGRESS  4.  Pt will decrease edema in  RUE by 50% or greater to improve mobility in RUE required for active participation in bathing and grooming tasks.   Goal status: IN PROGRESS  5.  Pt will demonstrate ability to make at least 50% of a grasp to promote use when grasping large items such as a cup or water bottle.   Goal status: IN PROGRESS   LONG TERM GOALS: Target date: 06/09/23  Pt will decrease pain in the RUE to 2/10 or less to improve ability to sleep for 3+ consecutive hours without waking due to pain.   Goal status: IN PROGRESS  2.  Pt will increase RUE A/ROM to at least 120 degrees shoulder flexion/abduction, and wrist extension by 25+ degrees to improve use of RUE during meal preparation tasks.   Goal status: IN PROGRESS  3.  Pt will increase RUE strength to 4-/5 or greater to improve ability to use RUE during housework tasks.   Goal status: IN PROGRESS  4.  Pt will increase grip strength to at least 20# and pinch strength to at least 5# to improve ability to grasp and carry groceries.  Baseline:  Goal status: IN PROGRESS  5.  Pt will decrease RUE fascial restrictions to min amounts or less to improve ability to perform functional reaching tasks.   Goal status: IN PROGRESS   ASSESSMENT:  CLINICAL IMPRESSION: Pt reports MD said she still has a way to go but is improving. Pt with mild soreness today. Continued with manual techniques, stretching to digits. Pt is wearing flexion glove for 30 minutes 3x/day, OT noting slow progress with digit ROM. Continued with AA/ROM for shoulder, added functional reaching and UBE today. Pt did great with functional reaching. Verbal cuing for form and technique during tasks. Pt continues to have guarding and ROM deficits throughout the RUE.   PERFORMANCE DEFICITS:  in functional skills including ADLs, IADLs, coordination, dexterity, proprioception, edema, ROM, strength, pain, fascial restrictions, Fine motor control, Gross motor control, and UE functional use    PLAN:  OT  FREQUENCY: 3x/week  OT DURATION: 8 weeks  PLANNED INTERVENTIONS: 97168 OT Re-evaluation, 97535 self care/ADL training, 16109 therapeutic exercise, 97530 therapeutic activity, 97140 manual therapy, 97035 ultrasound, 97018 paraffin, 60454 moist heat, 97010 cryotherapy, 97032 electrical stimulation (manual), 97014 electrical stimulation unattended, 97760 Splinting (initial encounter), and DME and/or AE instructions  CONSULTED AND AGREED WITH PLAN OF CARE: Patient  PLAN FOR NEXT SESSION: retrograde massage and manual techniques, passive stretching progressing to AA/ROM and A/ROM as able to tolerate. Follow up on HEP; adjust flexion glove as able.    Ezra Sites, OTR/L  [phone] 05/11/2023, 11:54 AM

## 2023-05-14 ENCOUNTER — Ambulatory Visit (HOSPITAL_COMMUNITY): Payer: No Typology Code available for payment source | Admitting: Occupational Therapy

## 2023-05-14 ENCOUNTER — Encounter (HOSPITAL_COMMUNITY): Payer: Self-pay | Admitting: Occupational Therapy

## 2023-05-14 DIAGNOSIS — M25511 Pain in right shoulder: Secondary | ICD-10-CM | POA: Diagnosis not present

## 2023-05-14 DIAGNOSIS — M25641 Stiffness of right hand, not elsewhere classified: Secondary | ICD-10-CM

## 2023-05-14 DIAGNOSIS — R29898 Other symptoms and signs involving the musculoskeletal system: Secondary | ICD-10-CM

## 2023-05-14 DIAGNOSIS — M25611 Stiffness of right shoulder, not elsewhere classified: Secondary | ICD-10-CM

## 2023-05-14 DIAGNOSIS — M25621 Stiffness of right elbow, not elsewhere classified: Secondary | ICD-10-CM

## 2023-05-14 NOTE — Therapy (Signed)
OUTPATIENT OCCUPATIONAL THERAPY ORTHO TREATMENT  Patient Name: Stephanie Ramos MRN: [id number] DOB:[birth year], 77 y.o., female Today's Date: 05/14/2023    END OF SESSION:  OT End of Session - 05/14/23 1056     Visit Number 14    Number of Visits 24    Date for OT Re-Evaluation 06/09/23    Authorization Type [employer], copay: $15    Authorization Time Period no auth required    OT Start Time 1013    OT Stop Time 1056    OT Time Calculation (min) 43 min    Activity Tolerance Patient tolerated treatment well    Behavior During Therapy WFL for tasks assessed/performed             Past Medical History:  Diagnosis Date   High iron content of liver determined by magnetic resonance imaging 08/02/2021   Hyperlipemia    Past Surgical History:  Procedure Laterality Date   BREAST SURGERY     CATARACT EXTRACTION W/PHACO Left 08/11/2019   Procedure: CATARACT EXTRACTION PHACO AND INTRAOCULAR LENS PLACEMENT (IOC) (CDE: 9.68);  Surgeon: [name], MD;  Location: [facility];  Service: Ophthalmology;  Laterality: Left;   CATARACT EXTRACTION W/PHACO Right 09/29/2019   Procedure: CATARACT EXTRACTION PHACO AND INTRAOCULAR LENS PLACEMENT RIGHT EYE;  Surgeon: [name], MD;  Location: [facility];  Service: Ophthalmology;  Laterality: Right;  CDE: 10.60   DILITATION & CURRETTAGE/HYSTROSCOPY WITH ESSURE     Patient Active Problem List   Diagnosis Date Noted   Comminuted fracture of right humerus 03/29/2023   Preventative health care 03/10/2022   Hypercalcemia 09/08/2021   High iron content of liver determined by magnetic resonance imaging 08/02/2021   Hyperkalemia 07/21/2021   Elevated ferritin level 06/24/2021   Fatigue 05/20/2021   Iron deficiency 01/18/2021   Elevated BP without diagnosis of hypertension 01/18/2021   Overweight with body mass index (BMI) of 26 to 26.9 in adult 03/18/2020   Refused influenza vaccine 03/18/2020   Vertigo 12/28/2017   Hyperlipemia 12/28/2017    PCP: Dr. Christel Mormon REFERRING PROVIDER: Dr. Teryl Lucy  ONSET DATE: 02/15/23  REFERRING DIAG: rt prox humerus fx with regional pain syndrome of right upper etremity   THERAPY DIAG:  Acute pain of right shoulder  Stiffness of right shoulder, not elsewhere classified  Stiffness of right hand, not elsewhere classified  Stiffness of right elbow, not elsewhere classified  Other symptoms and signs involving the musculoskeletal system  Rationale for Evaluation and Treatment: Rehabilitation  SUBJECTIVE:   SUBJECTIVE STATEMENT: S: I've been able to reach forward more.    PERTINENT HISTORY: Pt is a 77 y/o female presenting s/p right proximal humerus fracture after falling when cleaning up in the woods on 02/15/23. Pt attended therapy at [facility] approximately 4 times before transferring to this clinic.   PRECAUTIONS: Shoulder  WEIGHT BEARING RESTRICTIONS: Yes NWB  PAIN:  Are you having pain? Yes: NPRS scale: 2/10 Pain location: right shoulder Pain description: sore Aggravating factors: weather, use Relieving factors: rest  FALLS: Has patient fallen in last 6 months? Yes. Number of falls 1  PLOF: Independent  PATIENT GOALS: To get the right arm moving again.   NEXT MD VISIT: 06/11/23  OBJECTIVE:  Note: Objective measures were completed at Evaluation unless otherwise noted.  HAND DOMINANCE: Right  ADLs: Overall ADLs: Pt is unable to use the RUE for ADLs. She can hold paper plate between her fingers. She is unable to make a fist or use her hand and arm.  Pt is using the LUE for all tasks, using hemi-dressing techniques and strategies for independence.    FUNCTIONAL OUTCOME MEASURES: Quick Dash: 72.73  UPPER EXTREMITY ROM:       Assessed in sitting, er/IR adducted  Passive ROM Right eval Right 05/07/23  Shoulder flexion 61 84  Shoulder abduction 54 66  Shoulder internal rotation 90 90  Shoulder external rotation 19 28  Elbow flexion 136 145  Elbow  extension -30 -5  Wrist flexion 55 70  Wrist extension 8 22  Wrist ulnar deviation 25   Wrist radial deviation 8 11  Wrist pronation 85 90  Wrist supination 85 90  (Blank rows = not tested)   Assessed in sitting, er/IR adducted  Active ROM Right eval Right 05/07/23  Shoulder flexion 33 68  Shoulder abduction 41 56  Shoulder internal rotation 90 90  Shoulder external rotation 20 34  Elbow flexion 128 142  Elbow extension -45 -10  Wrist flexion 55 60  Wrist extension 8 12  Wrist ulnar deviation 25   Wrist radial deviation 4 5  Wrist pronation 70 90  Wrist supination 80 88  (Blank rows = not tested)      Active ROM Right 05/07/23  Thumb MCP (0-60) 34  Thumb IP (0-80) 12  Index MCP (0-90) 50  Index PIP (0-100) 52  Index DIP (0-70)  12  Long MCP (0-90)  42  Long PIP (0-100)  40  Long DIP (0-70)  0  Ring MCP (0-90)  30  Ring PIP (0-100)  34  Ring DIP (0-70)  0  Little MCP (0-90)  32  Little PIP (0-100)  36  Little DIP (0-70)  0  (Blank rows = not tested)  UPPER EXTREMITY MMT:       Unable to assess due to pain, limited active movement   12/2: shoulder evaluated on observation; MMT for elbow and wrist  MMT Right eval  Shoulder flexion 3-/5  Shoulder abduction 3-/5  Shoulder internal rotation 3/5  Shoulder external rotation 3/5  Elbow flexion 4/5  Elbow extension 4-/5  Wrist flexion 3+/5  Wrist extension 3+/5  Wrist ulnar deviation 4/5  Wrist radial deviation 3+/5  Wrist pronation 4-/5  Wrist supination 4-/5  (Blank rows = not tested)  HAND FUNCTION: Unable, TBD   COORDINATION: Unable, TBD  SENSATION: WFL  EDEMA:  -Right  -Left Upper arm    32cm 30.5cm Elbow           29cm 24cm Mid-forearm 20cm 20.5cm Wrist            17cm 14.5cm Palm            18.5cm 17cm  -Right   Upper arm    31cm  Elbow           25.5cm Mid-forearm 20cm  Wrist            15.5cm  Palm            17.5cm   OBSERVATIONS: pt with generalized edema throughout RUE;  joint stiffness in digits, wrist, elbow, and shoulder. Consistent with possibility of developing CRPS noting dx of regional pain syndrome of RUE   TODAY'S TREATMENT:  DATE:   05/14/23 -Manual: retrograde massage and myofascial release to entire RUE to decrease stiffness and increase joint ROM in RUE -A/ROM: elbow flexion/extension, wrist flexion/extension, ulnar/radial deviation, supination/pronation, x10 -AA/ROM: supine-flexion, abduction, protraction, horizontal abduction, er/IR, 15 reps -A/ROM: supine-flexion, abduction, protraction, horizontal abduction, er/IR, 10 reps -UBE: level 1, 2' forward, 2' reverse, pace: 4.5   05/11/23 -Manual: retrograde massage and myofascial release to entire RUE to decrease stiffness and increase joint ROM in RUE -P/ROM: right digits, wrist flexion/extension, ulnar/radial deviation, 5 reps -AA/ROM: supine-flexion, abduction, protraction, horizontal abduction, er/IR, 15 reps -Functional Reaching: pinch tree, yellow, red green, resistance clips x7 each, reaching up to place on vertical pole, then bringing back down to horizontal pole  05/09/23 -Manual: retrograde massage and myofascial release to entire RUE to decrease stiffness and increase joint ROM in RUE -P/ROM: right digits, wrist flexion/extension, ulnar/radial deviation, 5 reps -AA/ROM: supine-flexion, abduction, protraction, horizontal abduction, er/IR, 15 reps -Wall wash: 1' flexion, low level -Functional reaching: pt placing 10 cones on bottom shelf of overhead cabinet in flexion, then removing -Cone pass behind back, 10 reps -UBE: level 1, 2' forward, 2' reverse, pace: 6.0    PATIENT EDUCATION: Education details: Wall Climbs Person educated: Patient Education method: Explanation, Demonstration, Tactile cues, and Verbal cues Education comprehension: verbalized understanding  and returned demonstration  HOME EXERCISE PROGRAM: Eval: Provided shoulder length edema glove/sleeve, educated on donning/doffing, wear and care; continue table slides from [facility] therapist 11/8: AA/ROM in supine 11/15: wrist stretch into extension  11/27: flexion glove wear 12/9: Wall Climbs  GOALS: Goals reviewed with patient? Yes  SHORT TERM GOALS: Target date: 05/09/23  Pt will be provided with and educated on HEP to improve RUE functioning required for use during ADLs.   Goal status: IN PROGRESS  2.  Pt will increase P/ROM of right shoulder by 50+ degrees to improve ability to perform dressing and bathing tasks independently.   Goal status: IN PROGRESS  3.  Pt will increase A/ROM of right elbow to full extension to improve ability to perform low level functional reaching tasks.   Goal status: IN PROGRESS  4.  Pt will decrease edema in RUE by 50% or greater to improve mobility in RUE required for active participation in bathing and grooming tasks.   Goal status: IN PROGRESS  5.  Pt will demonstrate ability to make at least 50% of a grasp to promote use when grasping large items such as a cup or water bottle.   Goal status: IN PROGRESS   LONG TERM GOALS: Target date: 06/09/23  Pt will decrease pain in the RUE to 2/10 or less to improve ability to sleep for 3+ consecutive hours without waking due to pain.   Goal status: IN PROGRESS  2.  Pt will increase RUE A/ROM to at least 120 degrees shoulder flexion/abduction, and wrist extension by 25+ degrees to improve use of RUE during meal preparation tasks.   Goal status: IN PROGRESS  3.  Pt will increase RUE strength to 4-/5 or greater to improve ability to use RUE during housework tasks.   Goal status: IN PROGRESS  4.  Pt will increase grip strength to at least 20# and pinch strength to at least 5# to improve ability to grasp and carry groceries.  Baseline:  Goal status: IN PROGRESS  5.  Pt will decrease RUE  fascial restrictions to min amounts or less to improve ability to perform functional reaching tasks.   Goal status: IN PROGRESS   ASSESSMENT:  CLINICAL IMPRESSION: This session pt continuing to show good improvements with ROM and decreased pain. She is tolerating manual therapy targeted at reducing her fascial restrictions along the biceps, deltoid, and scapular region. With A/ROM, her wrist and elbow ROM are much improved, with most limitation when attempting to extend her wrist. During AA/ROM, she also is demonstrating further range, where she is achieving close to 60% of full ROM. Verbal and tactile cuing provided intermittently throughout session for positioning and technique, as well as to slow down repetitions for more control.   PERFORMANCE DEFICITS: in functional skills including ADLs, IADLs, coordination, dexterity, proprioception, edema, ROM, strength, pain, fascial restrictions, Fine motor control, Gross motor control, and UE functional use    PLAN:  OT FREQUENCY: 3x/week  OT DURATION: 8 weeks  PLANNED INTERVENTIONS: 97168 OT Re-evaluation, 97535 self care/ADL training, 16109 therapeutic exercise, 97530 therapeutic activity, 97140 manual therapy, 97035 ultrasound, 97018 paraffin, 60454 moist heat, 97010 cryotherapy, 97032 electrical stimulation (manual), 97014 electrical stimulation unattended, 97760 Splinting (initial encounter), and DME and/or AE instructions  CONSULTED AND AGREED WITH PLAN OF CARE: Patient  PLAN FOR NEXT SESSION: retrograde massage and manual techniques, passive stretching progressing to AA/ROM and A/ROM as able to tolerate. Follow up on HEP; adjust flexion glove as able.    Trish Mage, OTR/L [phone] 05/14/2023, 10:57 AM

## 2023-05-14 NOTE — Patient Instructions (Signed)

## 2023-05-16 ENCOUNTER — Ambulatory Visit (HOSPITAL_COMMUNITY): Payer: No Typology Code available for payment source | Admitting: Occupational Therapy

## 2023-05-16 ENCOUNTER — Encounter (HOSPITAL_COMMUNITY): Payer: Self-pay | Admitting: Occupational Therapy

## 2023-05-16 DIAGNOSIS — M25511 Pain in right shoulder: Secondary | ICD-10-CM

## 2023-05-16 DIAGNOSIS — M25611 Stiffness of right shoulder, not elsewhere classified: Secondary | ICD-10-CM

## 2023-05-16 DIAGNOSIS — M25621 Stiffness of right elbow, not elsewhere classified: Secondary | ICD-10-CM

## 2023-05-16 DIAGNOSIS — R29898 Other symptoms and signs involving the musculoskeletal system: Secondary | ICD-10-CM

## 2023-05-16 DIAGNOSIS — M25641 Stiffness of right hand, not elsewhere classified: Secondary | ICD-10-CM

## 2023-05-16 NOTE — Therapy (Signed)
OUTPATIENT OCCUPATIONAL THERAPY ORTHO TREATMENT  Patient Name: Stephanie Ramos MRN: [id number] DOB:09-03-1945, 77 y.o., female Today's Date: 05/16/2023    END OF SESSION:  OT End of Session - 05/16/23 1103     Visit Number 15    Number of Visits 24    Date for OT Re-Evaluation 06/09/23    Authorization Type [employer], copay: $15    Authorization Time Period no auth required    OT Start Time 1022    OT Stop Time 1103    OT Time Calculation (min) 41 min    Activity Tolerance Patient tolerated treatment well    Behavior During Therapy WFL for tasks assessed/performed              Past Medical History:  Diagnosis Date   High iron content of liver determined by magnetic resonance imaging 08/02/2021   Hyperlipemia    Past Surgical History:  Procedure Laterality Date   BREAST SURGERY     CATARACT EXTRACTION W/PHACO Left 08/11/2019   Procedure: CATARACT EXTRACTION PHACO AND INTRAOCULAR LENS PLACEMENT (IOC) (CDE: 9.68);  Surgeon: [name], MD;  Location: [facility];  Service: Ophthalmology;  Laterality: Left;   CATARACT EXTRACTION W/PHACO Right 09/29/2019   Procedure: CATARACT EXTRACTION PHACO AND INTRAOCULAR LENS PLACEMENT RIGHT EYE;  Surgeon: [name], MD;  Location: [facility];  Service: Ophthalmology;  Laterality: Right;  CDE: 10.60   DILITATION & CURRETTAGE/HYSTROSCOPY WITH ESSURE     Patient Active Problem List   Diagnosis Date Noted   Comminuted fracture of right humerus 03/29/2023   Preventative health care 03/10/2022   Hypercalcemia 09/08/2021   High iron content of liver determined by magnetic resonance imaging 08/02/2021   Hyperkalemia 07/21/2021   Elevated ferritin level 06/24/2021   Fatigue 05/20/2021   Iron deficiency 01/18/2021   Elevated BP without diagnosis of hypertension 01/18/2021   Overweight with body mass index (BMI) of 26 to 26.9 in adult 03/18/2020   Refused influenza vaccine 03/18/2020   Vertigo 12/28/2017   Hyperlipemia 12/28/2017    PCP: Dr. Christel Mormon REFERRING PROVIDER: Dr. Teryl Lucy  ONSET DATE: 02/15/23  REFERRING DIAG: rt prox humerus fx with regional pain syndrome of right upper etremity   THERAPY DIAG:  Acute pain of right shoulder  Stiffness of right shoulder, not elsewhere classified  Stiffness of right hand, not elsewhere classified  Stiffness of right elbow, not elsewhere classified  Other symptoms and signs involving the musculoskeletal system  Rationale for Evaluation and Treatment: Rehabilitation  SUBJECTIVE:   SUBJECTIVE STATEMENT: S: I've been able to reach forward more.    PERTINENT HISTORY: Pt is a 77 y/o female presenting s/p right proximal humerus fracture after falling when [place] up in the woods on 02/15/23. Pt attended therapy at [facility] approximately 4 times before transferring to this clinic.   PRECAUTIONS: Shoulder  WEIGHT BEARING RESTRICTIONS: Yes NWB  PAIN:  Are you having pain? Yes: NPRS scale: 2/10 Pain location: right shoulder Pain description: sore Aggravating factors: weather, use Relieving factors: rest  FALLS: Has patient fallen in last 6 months? Yes. Number of falls 1  PLOF: Independent  PATIENT GOALS: To get the right arm moving again.   NEXT MD VISIT: 06/11/23  OBJECTIVE:  Note: Objective measures were completed at Evaluation unless otherwise noted.  HAND DOMINANCE: Right  ADLs: Overall ADLs: Pt is unable to use the RUE for ADLs. She can hold paper plate between her fingers. She is unable to make a fist or use her hand and  arm. Pt is using the LUE for all tasks, using hemi-dressing techniques and strategies for independence.    FUNCTIONAL OUTCOME MEASURES: Quick Dash: 72.73  UPPER EXTREMITY ROM:       Assessed in sitting, er/IR adducted  Passive ROM Right eval Right 05/07/23  Shoulder flexion 61 84  Shoulder abduction 54 66  Shoulder internal rotation 90 90  Shoulder external rotation 19 28  Elbow flexion 136 145  Elbow  extension -30 -5  Wrist flexion 55 70  Wrist extension 8 22  Wrist ulnar deviation 25   Wrist radial deviation 8 11  Wrist pronation 85 90  Wrist supination 85 90  (Blank rows = not tested)   Assessed in sitting, er/IR adducted  Active ROM Right eval Right 05/07/23  Shoulder flexion 33 68  Shoulder abduction 41 56  Shoulder internal rotation 90 90  Shoulder external rotation 20 34  Elbow flexion 128 142  Elbow extension -45 -10  Wrist flexion 55 60  Wrist extension 8 12  Wrist ulnar deviation 25   Wrist radial deviation 4 5  Wrist pronation 70 90  Wrist supination 80 88  (Blank rows = not tested)      Active ROM Right 05/07/23  Thumb MCP (0-60) 34  Thumb IP (0-80) 12  Index MCP (0-90) 50  Index PIP (0-100) 52  Index DIP (0-70)  12  Long MCP (0-90)  42  Long PIP (0-100)  40  Long DIP (0-70)  0  Ring MCP (0-90)  30  Ring PIP (0-100)  34  Ring DIP (0-70)  0  Little MCP (0-90)  32  Little PIP (0-100)  36  Little DIP (0-70)  0  (Blank rows = not tested)  UPPER EXTREMITY MMT:       Unable to assess due to pain, limited active movement   12/2: shoulder evaluated on observation; MMT for elbow and wrist  MMT Right eval  Shoulder flexion 3-/5  Shoulder abduction 3-/5  Shoulder internal rotation 3/5  Shoulder external rotation 3/5  Elbow flexion 4/5  Elbow extension 4-/5  Wrist flexion 3+/5  Wrist extension 3+/5  Wrist ulnar deviation 4/5  Wrist radial deviation 3+/5  Wrist pronation 4-/5  Wrist supination 4-/5  (Blank rows = not tested)  HAND FUNCTION: Unable, TBD   COORDINATION: Unable, TBD  SENSATION: WFL  EDEMA:  -Right  -Left Upper arm    32cm 30.5cm Elbow           29cm 24cm Mid-forearm 20cm 20.5cm Wrist            17cm 14.5cm Palm            18.5cm 17cm  -Right   Upper arm    31cm  Elbow           25.5cm Mid-forearm 20cm  Wrist            15.5cm  Palm            17.5cm   OBSERVATIONS: pt with generalized edema throughout RUE;  joint stiffness in digits, wrist, elbow, and shoulder. Consistent with possibility of developing CRPS noting dx of regional pain syndrome of RUE   TODAY'S TREATMENT:  DATE:   05/16/23 -Manual: retrograde massage and myofascial release to entire RUE to decrease stiffness and increase joint ROM in RUE -Towel Roll Gripping, x10 -Towel roll wringing out, x10 -AA/ROM: supine-flexion, abduction, protraction, horizontal abduction, er/IR, 15 reps -A/ROM: supine-flexion, abduction, protraction, horizontal abduction, er/IR, 10 reps  05/14/23 -Manual: retrograde massage and myofascial release to entire RUE to decrease stiffness and increase joint ROM in RUE -A/ROM: elbow flexion/extension, wrist flexion/extension, ulnar/radial deviation, supination/pronation, x10 -AA/ROM: supine-flexion, abduction, protraction, horizontal abduction, er/IR, 15 reps -A/ROM: supine-flexion, abduction, protraction, horizontal abduction, er/IR, 10 reps -UBE: level 1, 2' forward, 2' reverse, pace: 4.5   05/11/23 -Manual: retrograde massage and myofascial release to entire RUE to decrease stiffness and increase joint ROM in RUE -P/ROM: right digits, wrist flexion/extension, ulnar/radial deviation, 5 reps -AA/ROM: supine-flexion, abduction, protraction, horizontal abduction, er/IR, 15 reps -Functional Reaching: pinch tree, yellow, red green, resistance clips x7 each, reaching up to place on vertical pole, then bringing back down to horizontal pole   PATIENT EDUCATION: Education details: Towel roll gripping and wringing out Person educated: Patient Education method: Explanation, Demonstration, Tactile cues, and Verbal cues Education comprehension: verbalized understanding and returned demonstration  HOME EXERCISE PROGRAM: Eval: Provided shoulder length edema glove/sleeve, educated on  donning/doffing, wear and care; continue table slides from [facility] therapist 11/8: AA/ROM in supine 11/15: wrist stretch into extension  11/27: flexion glove wear 12/9: Wall Climbs 12/11: Towel Roll gripping and wringing out  GOALS: Goals reviewed with patient? Yes  SHORT TERM GOALS: Target date: 05/09/23  Pt will be provided with and educated on HEP to improve RUE functioning required for use during ADLs.   Goal status: IN PROGRESS  2.  Pt will increase P/ROM of right shoulder by 50+ degrees to improve ability to perform dressing and bathing tasks independently.   Goal status: IN PROGRESS  3.  Pt will increase A/ROM of right elbow to full extension to improve ability to perform low level functional reaching tasks.   Goal status: IN PROGRESS  4.  Pt will decrease edema in RUE by 50% or greater to improve mobility in RUE required for active participation in bathing and grooming tasks.   Goal status: IN PROGRESS  5.  Pt will demonstrate ability to make at least 50% of a grasp to promote use when grasping large items such as a cup or water bottle.   Goal status: IN PROGRESS   LONG TERM GOALS: Target date: 06/09/23  Pt will decrease pain in the RUE to 2/10 or less to improve ability to sleep for 3+ consecutive hours without waking due to pain.   Goal status: IN PROGRESS  2.  Pt will increase RUE A/ROM to at least 120 degrees shoulder flexion/abduction, and wrist extension by 25+ degrees to improve use of RUE during meal preparation tasks.   Goal status: IN PROGRESS  3.  Pt will increase RUE strength to 4-/5 or greater to improve ability to use RUE during housework tasks.   Goal status: IN PROGRESS  4.  Pt will increase grip strength to at least 20# and pinch strength to at least 5# to improve ability to grasp and carry groceries.  Baseline:  Goal status: IN PROGRESS  5.  Pt will decrease RUE fascial restrictions to min amounts or less to improve ability to perform  functional reaching tasks.   Goal status: IN PROGRESS   ASSESSMENT:  CLINICAL IMPRESSION: Pt reporting minimal pain and discomfort this session. She continues to experience some fatigue with increased  repetitions and exercises, requiring 3 rest breaks throughout session. OT added towel roll gripping and wringing out to improve her overall grip. Her AA/ROM continues to show improvement in range and A/ROM continued for low level strengthening and improving her mobility. Verbal and tactile cuing to limit compensatory body movements, positioning, and technique.   PERFORMANCE DEFICITS: in functional skills including ADLs, IADLs, coordination, dexterity, proprioception, edema, ROM, strength, pain, fascial restrictions, Fine motor control, Gross motor control, and UE functional use    PLAN:  OT FREQUENCY: 3x/week  OT DURATION: 8 weeks  PLANNED INTERVENTIONS: 97168 OT Re-evaluation, 97535 self care/ADL training, 16109 therapeutic exercise, 97530 therapeutic activity, 97140 manual therapy, 97035 ultrasound, 97018 paraffin, 60454 moist heat, 97010 cryotherapy, 97032 electrical stimulation (manual), 97014 electrical stimulation unattended, 97760 Splinting (initial encounter), and DME and/or AE instructions  CONSULTED AND AGREED WITH PLAN OF CARE: Patient  PLAN FOR NEXT SESSION: retrograde massage and manual techniques, passive stretching progressing to AA/ROM and A/ROM as able to tolerate. Follow up on HEP; adjust flexion glove as able.    Trish Mage, OTR/L [phone] 05/16/2023, 11:06 AM

## 2023-05-18 ENCOUNTER — Encounter (HOSPITAL_COMMUNITY): Payer: Self-pay | Admitting: Occupational Therapy

## 2023-05-18 ENCOUNTER — Ambulatory Visit (HOSPITAL_COMMUNITY): Payer: No Typology Code available for payment source | Admitting: Occupational Therapy

## 2023-05-18 DIAGNOSIS — M25611 Stiffness of right shoulder, not elsewhere classified: Secondary | ICD-10-CM

## 2023-05-18 DIAGNOSIS — M25621 Stiffness of right elbow, not elsewhere classified: Secondary | ICD-10-CM

## 2023-05-18 DIAGNOSIS — R29898 Other symptoms and signs involving the musculoskeletal system: Secondary | ICD-10-CM

## 2023-05-18 DIAGNOSIS — M25511 Pain in right shoulder: Secondary | ICD-10-CM

## 2023-05-18 DIAGNOSIS — M25641 Stiffness of right hand, not elsewhere classified: Secondary | ICD-10-CM

## 2023-05-18 NOTE — Therapy (Signed)
OUTPATIENT OCCUPATIONAL THERAPY ORTHO TREATMENT  Patient Name: Stephanie Ramos MRN: [id number] DOB:10/14/1945, 77 y.o., female Today's Date: 05/18/2023    END OF SESSION:  OT End of Session - 05/18/23 1056     Visit Number 16    Number of Visits 24    Date for OT Re-Evaluation 06/09/23    Authorization Type [employer], copay: $15    Authorization Time Period no auth required    OT Start Time 1020    OT Stop Time 1102    OT Time Calculation (min) 42 min    Activity Tolerance Patient tolerated treatment well    Behavior During Therapy WFL for tasks assessed/performed               Past Medical History:  Diagnosis Date   High iron content of liver determined by magnetic resonance imaging 08/02/2021   Hyperlipemia    Past Surgical History:  Procedure Laterality Date   BREAST SURGERY     CATARACT EXTRACTION W/PHACO Left 08/11/2019   Procedure: CATARACT EXTRACTION PHACO AND INTRAOCULAR LENS PLACEMENT (IOC) (CDE: 9.68);  Surgeon: [name], MD;  Location: [facility];  Service: Ophthalmology;  Laterality: Left;   CATARACT EXTRACTION W/PHACO Right 09/29/2019   Procedure: CATARACT EXTRACTION PHACO AND INTRAOCULAR LENS PLACEMENT RIGHT EYE;  Surgeon: [name], MD;  Location: [facility];  Service: Ophthalmology;  Laterality: Right;  CDE: 10.60   DILITATION & CURRETTAGE/HYSTROSCOPY WITH ESSURE     Patient Active Problem List   Diagnosis Date Noted   Comminuted fracture of right humerus 03/29/2023   Preventative health care 03/10/2022   Hypercalcemia 09/08/2021   High iron content of liver determined by magnetic resonance imaging 08/02/2021   Hyperkalemia 07/21/2021   Elevated ferritin level 06/24/2021   Fatigue 05/20/2021   Iron deficiency 01/18/2021   Elevated BP without diagnosis of hypertension 01/18/2021   Overweight with body mass index (BMI) of 26 to 26.9 in adult 03/18/2020   Refused influenza vaccine 03/18/2020   Vertigo 12/28/2017   Hyperlipemia  12/28/2017   PCP: Dr. Christel Mormon REFERRING PROVIDER: Dr. Teryl Lucy  ONSET DATE: 02/15/23  REFERRING DIAG: rt prox humerus fx with regional pain syndrome of right upper etremity   THERAPY DIAG:  Acute pain of right shoulder  Stiffness of right shoulder, not elsewhere classified  Stiffness of right hand, not elsewhere classified  Stiffness of right elbow, not elsewhere classified  Other symptoms and signs involving the musculoskeletal system  Rationale for Evaluation and Treatment: Rehabilitation  SUBJECTIVE:   SUBJECTIVE STATEMENT: S: I've been washing the dishes with both hands.    PERTINENT HISTORY: Pt is a 77 y/o female presenting s/p right proximal humerus fracture after falling when cleaning up in the woods on 02/15/23. Pt attended therapy at [facility] approximately 4 times before transferring to this clinic.   PRECAUTIONS: Shoulder  WEIGHT BEARING RESTRICTIONS: Yes NWB  PAIN:  Are you having pain? Yes: NPRS scale: 2/10 Pain location: right shoulder Pain description: sore Aggravating factors: weather, use Relieving factors: rest  FALLS: Has patient fallen in last 6 months? Yes. Number of falls 1  PLOF: Independent  PATIENT GOALS: To get the right arm moving again.   NEXT MD VISIT: 06/11/23  OBJECTIVE:  Note: Objective measures were completed at Evaluation unless otherwise noted.  HAND DOMINANCE: Right  ADLs: Overall ADLs: Pt is unable to use the RUE for ADLs. She can hold paper plate between her fingers. She is unable to make a fist or use her  hand and arm. Pt is using the LUE for all tasks, using hemi-dressing techniques and strategies for independence.    FUNCTIONAL OUTCOME MEASURES: Quick Dash: 72.73  UPPER EXTREMITY ROM:       Assessed in sitting, er/IR adducted  Passive ROM Right eval Right 05/07/23  Shoulder flexion 61 84  Shoulder abduction 54 66  Shoulder internal rotation 90 90  Shoulder external rotation 19 28  Elbow flexion  136 145  Elbow extension -30 -5  Wrist flexion 55 70  Wrist extension 8 22  Wrist ulnar deviation 25   Wrist radial deviation 8 11  Wrist pronation 85 90  Wrist supination 85 90  (Blank rows = not tested)   Assessed in sitting, er/IR adducted  Active ROM Right eval Right 05/07/23  Shoulder flexion 33 68  Shoulder abduction 41 56  Shoulder internal rotation 90 90  Shoulder external rotation 20 34  Elbow flexion 128 142  Elbow extension -45 -10  Wrist flexion 55 60  Wrist extension 8 12  Wrist ulnar deviation 25   Wrist radial deviation 4 5  Wrist pronation 70 90  Wrist supination 80 88  (Blank rows = not tested)      Active ROM Right 05/07/23  Thumb MCP (0-60) 34  Thumb IP (0-80) 12  Index MCP (0-90) 50  Index PIP (0-100) 52  Index DIP (0-70)  12  Long MCP (0-90)  42  Long PIP (0-100)  40  Long DIP (0-70)  0  Ring MCP (0-90)  30  Ring PIP (0-100)  34  Ring DIP (0-70)  0  Little MCP (0-90)  32  Little PIP (0-100)  36  Little DIP (0-70)  0  (Blank rows = not tested)  UPPER EXTREMITY MMT:       Unable to assess due to pain, limited active movement   12/2: shoulder evaluated on observation; MMT for elbow and wrist  MMT Right eval  Shoulder flexion 3-/5  Shoulder abduction 3-/5  Shoulder internal rotation 3/5  Shoulder external rotation 3/5  Elbow flexion 4/5  Elbow extension 4-/5  Wrist flexion 3+/5  Wrist extension 3+/5  Wrist ulnar deviation 4/5  Wrist radial deviation 3+/5  Wrist pronation 4-/5  Wrist supination 4-/5  (Blank rows = not tested)  HAND FUNCTION: Unable, TBD   COORDINATION: Unable, TBD  SENSATION: WFL  EDEMA:  -Right  -Left Upper arm    32cm 30.5cm Elbow           29cm 24cm Mid-forearm 20cm 20.5cm Wrist            17cm 14.5cm Palm            18.5cm 17cm  -Right   Upper arm    31cm  Elbow           25.5cm Mid-forearm 20cm  Wrist            15.5cm  Palm            17.5cm   OBSERVATIONS: pt with generalized edema  throughout RUE; joint stiffness in digits, wrist, elbow, and shoulder. Consistent with possibility of developing CRPS noting dx of regional pain syndrome of RUE   TODAY'S TREATMENT:  DATE:   05/18/23 -Manual: retrograde massage and myofascial release to entire RUE to decrease stiffness and increase joint ROM in RUE -AA/ROM: supine-flexion, abduction, protraction, horizontal abduction, er/IR, 15 reps -A/ROM: supine-flexion, abduction, protraction, horizontal abduction, er/IR, 10 reps - A/ROM: wrist propped on blue foam; flexion and extension, radial/ulnar deviation 10x each  -UBE: level 1, 2' forward, 2' reverse, pace: 4.5  - Pinch and reach: clip tree; removing from bottom row and placing on vertical pole with red and green clips   05/16/23 -Manual: retrograde massage and myofascial release to entire RUE to decrease stiffness and increase joint ROM in RUE -Towel Roll Gripping, x10 -Towel roll wringing out, x10 -AA/ROM: supine-flexion, abduction, protraction, horizontal abduction, er/IR, 15 reps -A/ROM: supine-flexion, abduction, protraction, horizontal abduction, er/IR, 10 reps  05/14/23 -Manual: retrograde massage and myofascial release to entire RUE to decrease stiffness and increase joint ROM in RUE -A/ROM: elbow flexion/extension, wrist flexion/extension, ulnar/radial deviation, supination/pronation, x10 -AA/ROM: supine-flexion, abduction, protraction, horizontal abduction, er/IR, 15 reps -A/ROM: supine-flexion, abduction, protraction, horizontal abduction, er/IR, 10 reps -UBE: level 1, 2' forward, 2' reverse, pace: 4.5    PATIENT EDUCATION: Education details: Towel roll gripping and wringing out Person educated: Patient Education method: Explanation, Demonstration, Tactile cues, and Verbal cues Education comprehension: verbalized understanding and returned  demonstration  HOME EXERCISE PROGRAM: Eval: Provided shoulder length edema glove/sleeve, educated on donning/doffing, wear and care; continue table slides from [facility] therapist 11/8: AA/ROM in supine 11/15: wrist stretch into extension  11/27: flexion glove wear 12/9: Wall Climbs 12/11: Towel Roll gripping and wringing out  GOALS: Goals reviewed with patient? Yes  SHORT TERM GOALS: Target date: 05/09/23  Pt will be provided with and educated on HEP to improve RUE functioning required for use during ADLs.   Goal status: IN PROGRESS  2.  Pt will increase P/ROM of right shoulder by 50+ degrees to improve ability to perform dressing and bathing tasks independently.   Goal status: IN PROGRESS  3.  Pt will increase A/ROM of right elbow to full extension to improve ability to perform low level functional reaching tasks.   Goal status: IN PROGRESS  4.  Pt will decrease edema in RUE by 50% or greater to improve mobility in RUE required for active participation in bathing and grooming tasks.   Goal status: IN PROGRESS  5.  Pt will demonstrate ability to make at least 50% of a grasp to promote use when grasping large items such as a cup or water bottle.   Goal status: IN PROGRESS   LONG TERM GOALS: Target date: 06/09/23  Pt will decrease pain in the RUE to 2/10 or less to improve ability to sleep for 3+ consecutive hours without waking due to pain.   Goal status: IN PROGRESS  2.  Pt will increase RUE A/ROM to at least 120 degrees shoulder flexion/abduction, and wrist extension by 25+ degrees to improve use of RUE during meal preparation tasks.   Goal status: IN PROGRESS  3.  Pt will increase RUE strength to 4-/5 or greater to improve ability to use RUE during housework tasks.   Goal status: IN PROGRESS  4.  Pt will increase grip strength to at least 20# and pinch strength to at least 5# to improve ability to grasp and carry groceries.  Baseline:  Goal status: IN  PROGRESS  5.  Pt will decrease RUE fascial restrictions to min amounts or less to improve ability to perform functional reaching tasks.   Goal status: IN PROGRESS  ASSESSMENT:  CLINICAL IMPRESSION: Pt reports that she has been doing more with her right hand and is washing the dishes with both hands now. Demonstrating improvements with AA/ROM and A/ROM from previous visits. She reports most difficulty with shoulder abduction. Rest breaks given throughout session as needed due to fatigue. Pt would continue to benefit from OT to improve UE functioning.   PERFORMANCE DEFICITS: in functional skills including ADLs, IADLs, coordination, dexterity, proprioception, edema, ROM, strength, pain, fascial restrictions, Fine motor control, Gross motor control, and UE functional use    PLAN:  OT FREQUENCY: 3x/week  OT DURATION: 8 weeks  PLANNED INTERVENTIONS: 97168 OT Re-evaluation, 97535 self care/ADL training, 16109 therapeutic exercise, 97530 therapeutic activity, 97140 manual therapy, 97035 ultrasound, 97018 paraffin, 60454 moist heat, 97010 cryotherapy, 97032 electrical stimulation (manual), 97014 electrical stimulation unattended, 97760 Splinting (initial encounter), and DME and/or AE instructions  CONSULTED AND AGREED WITH PLAN OF CARE: Patient  PLAN FOR NEXT SESSION: retrograde massage and manual techniques, passive stretching progressing to AA/ROM and A/ROM as able to tolerate. Follow up on HEP; adjust flexion glove as able.    Lurena Joiner, OTR/L [phone] 05/18/2023, 10:58 AM

## 2023-05-21 ENCOUNTER — Ambulatory Visit (HOSPITAL_COMMUNITY): Payer: No Typology Code available for payment source | Admitting: Occupational Therapy

## 2023-05-21 ENCOUNTER — Encounter (HOSPITAL_COMMUNITY): Payer: Self-pay | Admitting: Occupational Therapy

## 2023-05-21 DIAGNOSIS — R29898 Other symptoms and signs involving the musculoskeletal system: Secondary | ICD-10-CM

## 2023-05-21 DIAGNOSIS — M25511 Pain in right shoulder: Secondary | ICD-10-CM

## 2023-05-21 DIAGNOSIS — M25641 Stiffness of right hand, not elsewhere classified: Secondary | ICD-10-CM

## 2023-05-21 DIAGNOSIS — M25621 Stiffness of right elbow, not elsewhere classified: Secondary | ICD-10-CM

## 2023-05-21 DIAGNOSIS — M25611 Stiffness of right shoulder, not elsewhere classified: Secondary | ICD-10-CM

## 2023-05-21 NOTE — Therapy (Signed)
OUTPATIENT OCCUPATIONAL THERAPY ORTHO TREATMENT  Patient Name: Stephanie Ramos MRN: [id number] DOB:Jul 31, 1945, 77 y.o., female Today's Date: 05/21/2023    END OF SESSION:  OT End of Session - 05/21/23 1206     Visit Number 17    Number of Visits 24    Date for OT Re-Evaluation 06/09/23    Authorization Type [employer], copay: $15    Authorization Time Period no auth required    OT Start Time 1017    OT Stop Time 1058    OT Time Calculation (min) 41 min    Activity Tolerance Patient tolerated treatment well    Behavior During Therapy WFL for tasks assessed/performed                Past Medical History:  Diagnosis Date   High iron content of liver determined by magnetic resonance imaging 08/02/2021   Hyperlipemia    Past Surgical History:  Procedure Laterality Date   BREAST SURGERY     CATARACT EXTRACTION W/PHACO Left 08/11/2019   Procedure: CATARACT EXTRACTION PHACO AND INTRAOCULAR LENS PLACEMENT (IOC) (CDE: 9.68);  Surgeon: [name], MD;  Location: [facility];  Service: Ophthalmology;  Laterality: Left;   CATARACT EXTRACTION W/PHACO Right 09/29/2019   Procedure: CATARACT EXTRACTION PHACO AND INTRAOCULAR LENS PLACEMENT RIGHT EYE;  Surgeon: [name], MD;  Location: [facility];  Service: Ophthalmology;  Laterality: Right;  CDE: 10.60   DILITATION & CURRETTAGE/HYSTROSCOPY WITH ESSURE     Patient Active Problem List   Diagnosis Date Noted   Comminuted fracture of right humerus 03/29/2023   Preventative health care 03/10/2022   Hypercalcemia 09/08/2021   High iron content of liver determined by magnetic resonance imaging 08/02/2021   Hyperkalemia 07/21/2021   Elevated ferritin level 06/24/2021   Fatigue 05/20/2021   Iron deficiency 01/18/2021   Elevated BP without diagnosis of hypertension 01/18/2021   Overweight with body mass index (BMI) of 26 to 26.9 in adult 03/18/2020   Refused influenza vaccine 03/18/2020   Vertigo 12/28/2017   Hyperlipemia  12/28/2017   PCP: Dr. Christel Mormon REFERRING PROVIDER: Dr. Teryl Lucy  ONSET DATE: 02/15/23  REFERRING DIAG: rt prox humerus fx with regional pain syndrome of right upper etremity   THERAPY DIAG:  Acute pain of right shoulder  Stiffness of right shoulder, not elsewhere classified  Stiffness of right hand, not elsewhere classified  Stiffness of right elbow, not elsewhere classified  Other symptoms and signs involving the musculoskeletal system  Rationale for Evaluation and Treatment: Rehabilitation  SUBJECTIVE:   SUBJECTIVE STATEMENT: S: I don't have any pain today.   PERTINENT HISTORY: Pt is a 77 y/o female presenting s/p right proximal humerus fracture after falling when cleaning up in the woods on 02/15/23. Pt attended therapy at [facility] approximately 4 times before transferring to this clinic.   PRECAUTIONS: Shoulder  WEIGHT BEARING RESTRICTIONS: Yes NWB  PAIN:  Are you having pain? No  FALLS: Has patient fallen in last 6 months? Yes. Number of falls 1  PLOF: Independent  PATIENT GOALS: To get the right arm moving again.   NEXT MD VISIT: 06/11/23  OBJECTIVE:  Note: Objective measures were completed at Evaluation unless otherwise noted.  HAND DOMINANCE: Right  ADLs: Overall ADLs: Pt is unable to use the RUE for ADLs. She can hold paper plate between her fingers. She is unable to make a fist or use her hand and arm. Pt is using the LUE for all tasks, using hemi-dressing techniques and strategies for independence.  FUNCTIONAL OUTCOME MEASURES: Quick Dash: 72.73  UPPER EXTREMITY ROM:       Assessed in sitting, er/IR adducted  Passive ROM Right eval Right 05/07/23  Shoulder flexion 61 84  Shoulder abduction 54 66  Shoulder internal rotation 90 90  Shoulder external rotation 19 28  Elbow flexion 136 145  Elbow extension -30 -5  Wrist flexion 55 70  Wrist extension 8 22  Wrist ulnar deviation 25   Wrist radial deviation 8 11  Wrist pronation  85 90  Wrist supination 85 90  (Blank rows = not tested)   Assessed in sitting, er/IR adducted  Active ROM Right eval Right 05/07/23  Shoulder flexion 33 68  Shoulder abduction 41 56  Shoulder internal rotation 90 90  Shoulder external rotation 20 34  Elbow flexion 128 142  Elbow extension -45 -10  Wrist flexion 55 60  Wrist extension 8 12  Wrist ulnar deviation 25   Wrist radial deviation 4 5  Wrist pronation 70 90  Wrist supination 80 88  (Blank rows = not tested)      Active ROM Right 05/07/23  Thumb MCP (0-60) 34  Thumb IP (0-80) 12  Index MCP (0-90) 50  Index PIP (0-100) 52  Index DIP (0-70)  12  Long MCP (0-90)  42  Long PIP (0-100)  40  Long DIP (0-70)  0  Ring MCP (0-90)  30  Ring PIP (0-100)  34  Ring DIP (0-70)  0  Little MCP (0-90)  32  Little PIP (0-100)  36  Little DIP (0-70)  0  (Blank rows = not tested)  UPPER EXTREMITY MMT:       Unable to assess due to pain, limited active movement   12/2: shoulder evaluated on observation; MMT for elbow and wrist  MMT Right eval  Shoulder flexion 3-/5  Shoulder abduction 3-/5  Shoulder internal rotation 3/5  Shoulder external rotation 3/5  Elbow flexion 4/5  Elbow extension 4-/5  Wrist flexion 3+/5  Wrist extension 3+/5  Wrist ulnar deviation 4/5  Wrist radial deviation 3+/5  Wrist pronation 4-/5  Wrist supination 4-/5  (Blank rows = not tested)  HAND FUNCTION: Unable, TBD   COORDINATION: Unable, TBD  SENSATION: WFL  EDEMA:  -Right  -Left Upper arm    32cm 30.5cm Elbow           29cm 24cm Mid-forearm 20cm 20.5cm Wrist            17cm 14.5cm Palm            18.5cm 17cm  -Right   Upper arm    31cm  Elbow           25.5cm Mid-forearm 20cm  Wrist            15.5cm  Palm            17.5cm   OBSERVATIONS: pt with generalized edema throughout RUE; joint stiffness in digits, wrist, elbow, and shoulder. Consistent with possibility of developing CRPS noting dx of regional pain syndrome of  RUE   TODAY'S TREATMENT:  DATE:  05/21/23 -Manual: retrograde massage and myofascial release to right hand and digits to decrease stiffness and increase joint ROM in RUE -P/ROM: passive stretching to right hand and digits, 5 reps -AA/ROM: supine-flexion, abduction, protraction, horizontal abduction, er/IR, 10 reps -Chair rail slide: 10 reps -AA/ROM standing-protraction, flexion, abduction, 10 reps -Pulleys: 1' flexion, 1' abduction  05/18/23 -Manual: retrograde massage and myofascial release to entire RUE to decrease stiffness and increase joint ROM in RUE -AA/ROM: supine-flexion, abduction, protraction, horizontal abduction, er/IR, 15 reps -A/ROM: supine-flexion, abduction, protraction, horizontal abduction, er/IR, 10 reps - A/ROM: wrist propped on blue foam; flexion and extension, radial/ulnar deviation 10x each  -UBE: level 1, 2' forward, 2' reverse, pace: 4.5  - Pinch and reach: clip tree; removing from bottom row and placing on vertical pole with red and green clips   05/16/23 -Manual: retrograde massage and myofascial release to entire RUE to decrease stiffness and increase joint ROM in RUE -Towel Roll Gripping, x10 -Towel roll wringing out, x10 -AA/ROM: supine-flexion, abduction, protraction, horizontal abduction, er/IR, 15 reps -A/ROM: supine-flexion, abduction, protraction, horizontal abduction, er/IR, 10 reps   PATIENT EDUCATION: Education details: reviewed HEP Person educated: Patient Education method: Programmer, multimedia, Demonstration, Tactile cues, and Verbal cues Education comprehension: verbalized understanding and returned demonstration  HOME EXERCISE PROGRAM: Eval: Provided shoulder length edema glove/sleeve, educated on donning/doffing, wear and care; continue table slides from [facility] therapist 11/8: AA/ROM in supine 11/15: wrist stretch  into extension  11/27: flexion glove wear 12/9: Wall Climbs 12/11: Towel Roll gripping and wringing out  GOALS: Goals reviewed with patient? Yes  SHORT TERM GOALS: Target date: 05/09/23  Pt will be provided with and educated on HEP to improve RUE functioning required for use during ADLs.   Goal status: IN PROGRESS  2.  Pt will increase P/ROM of right shoulder by 50+ degrees to improve ability to perform dressing and bathing tasks independently.   Goal status: IN PROGRESS  3.  Pt will increase A/ROM of right elbow to full extension to improve ability to perform low level functional reaching tasks.   Goal status: IN PROGRESS  4.  Pt will decrease edema in RUE by 50% or greater to improve mobility in RUE required for active participation in bathing and grooming tasks.   Goal status: IN PROGRESS  5.  Pt will demonstrate ability to make at least 50% of a grasp to promote use when grasping large items such as a cup or water bottle.   Goal status: IN PROGRESS   LONG TERM GOALS: Target date: 06/09/23  Pt will decrease pain in the RUE to 2/10 or less to improve ability to sleep for 3+ consecutive hours without waking due to pain.   Goal status: IN PROGRESS  2.  Pt will increase RUE A/ROM to at least 120 degrees shoulder flexion/abduction, and wrist extension by 25+ degrees to improve use of RUE during meal preparation tasks.   Goal status: IN PROGRESS  3.  Pt will increase RUE strength to 4-/5 or greater to improve ability to use RUE during housework tasks.   Goal status: IN PROGRESS  4.  Pt will increase grip strength to at least 20# and pinch strength to at least 5# to improve ability to grasp and carry groceries.  Baseline:  Goal status: IN PROGRESS  5.  Pt will decrease RUE fascial restrictions to min amounts or less to improve ability to perform functional reaching tasks.   Goal status: IN PROGRESS   ASSESSMENT:  CLINICAL IMPRESSION: Pt  reports that she has been  wearing her flexion glove and doing her exercises this past weekend. Pt feels that she can abduct her arm more today. Continued with manual techniques to the right hand, min fascial restrictions in shoulder region today. Pt completing AA/ROM in supine and standing, added chair rail slide. Attempted shoulder stretches however pt unable to achieve correct form therefore discontinued. Pt with mod to max tightness at approximately 50% ROM. Verbal cuing for form and technique, no reports of increased pain at end of session.   PERFORMANCE DEFICITS: in functional skills including ADLs, IADLs, coordination, dexterity, proprioception, edema, ROM, strength, pain, fascial restrictions, Fine motor control, Gross motor control, and UE functional use    PLAN:  OT FREQUENCY: 3x/week  OT DURATION: 8 weeks  PLANNED INTERVENTIONS: 97168 OT Re-evaluation, 97535 self care/ADL training, 09811 therapeutic exercise, 97530 therapeutic activity, 97140 manual therapy, 97035 ultrasound, 97018 paraffin, 91478 moist heat, 97010 cryotherapy, 97032 electrical stimulation (manual), 97014 electrical stimulation unattended, 97760 Splinting (initial encounter), and DME and/or AE instructions  CONSULTED AND AGREED WITH PLAN OF CARE: Patient  PLAN FOR NEXT SESSION: retrograde massage and manual techniques, passive stretching progressing to AA/ROM and A/ROM as able to tolerate. Follow up on HEP; adjust flexion glove as able.    Ezra Sites, OTR/L  [phone] 05/21/2023, 12:07 PM

## 2023-05-23 ENCOUNTER — Ambulatory Visit (HOSPITAL_COMMUNITY): Payer: No Typology Code available for payment source | Admitting: Occupational Therapy

## 2023-05-23 ENCOUNTER — Encounter (HOSPITAL_COMMUNITY): Payer: Self-pay | Admitting: Occupational Therapy

## 2023-05-23 DIAGNOSIS — M25641 Stiffness of right hand, not elsewhere classified: Secondary | ICD-10-CM

## 2023-05-23 DIAGNOSIS — M25511 Pain in right shoulder: Secondary | ICD-10-CM

## 2023-05-23 DIAGNOSIS — R29898 Other symptoms and signs involving the musculoskeletal system: Secondary | ICD-10-CM

## 2023-05-23 DIAGNOSIS — M25621 Stiffness of right elbow, not elsewhere classified: Secondary | ICD-10-CM

## 2023-05-23 DIAGNOSIS — M25611 Stiffness of right shoulder, not elsewhere classified: Secondary | ICD-10-CM

## 2023-05-23 NOTE — Therapy (Signed)
OUTPATIENT OCCUPATIONAL THERAPY ORTHO TREATMENT  Patient Name: Stephanie Ramos MRN: [id number] DOB:1945/12/08, 77 y.o., female Today's Date: 05/23/2023    END OF SESSION:  OT End of Session - 05/23/23 1224     Visit Number 18    Number of Visits 24    Date for OT Re-Evaluation 06/09/23    Authorization Type [employer], copay: $15    Authorization Time Period no auth required    OT Start Time 1143    OT Stop Time 1223    OT Time Calculation (min) 40 min    Activity Tolerance Patient tolerated treatment well    Behavior During Therapy [facility] for tasks assessed/performed                 Past Medical History:  Diagnosis Date   High iron content of liver determined by magnetic resonance imaging 08/02/2021   Hyperlipemia    Past Surgical History:  Procedure Laterality Date   BREAST SURGERY     CATARACT EXTRACTION W/PHACO Left 08/11/2019   Procedure: CATARACT EXTRACTION PHACO AND INTRAOCULAR LENS PLACEMENT (IOC) (CDE: 9.68);  Surgeon: [name], MD;  Location: [facility];  Service: Ophthalmology;  Laterality: Left;   CATARACT EXTRACTION W/PHACO Right 09/29/2019   Procedure: CATARACT EXTRACTION PHACO AND INTRAOCULAR LENS PLACEMENT RIGHT EYE;  Surgeon: [name], MD;  Location: [facility];  Service: Ophthalmology;  Laterality: Right;  CDE: 10.60   DILITATION & CURRETTAGE/HYSTROSCOPY WITH ESSURE     Patient Active Problem List   Diagnosis Date Noted   Comminuted fracture of right humerus 03/29/2023   Preventative health care 03/10/2022   Hypercalcemia 09/08/2021   High iron content of liver determined by magnetic resonance imaging 08/02/2021   Hyperkalemia 07/21/2021   Elevated ferritin level 06/24/2021   Fatigue 05/20/2021   Iron deficiency 01/18/2021   Elevated BP without diagnosis of hypertension 01/18/2021   Overweight with body mass index (BMI) of 26 to 26.9 in adult 03/18/2020   Refused influenza vaccine 03/18/2020   Vertigo 12/28/2017   Hyperlipemia  12/28/2017   PCP: Dr. Christel Mormon REFERRING PROVIDER: Dr. Teryl Lucy  ONSET DATE: 02/15/23  REFERRING DIAG: rt prox humerus fx with regional pain syndrome of right upper etremity   THERAPY DIAG:  Acute pain of right shoulder  Stiffness of right shoulder, not elsewhere classified  Stiffness of right hand, not elsewhere classified  Stiffness of right elbow, not elsewhere classified  Other symptoms and signs involving the musculoskeletal system  Rationale for Evaluation and Treatment: Rehabilitation  SUBJECTIVE:   SUBJECTIVE STATEMENT: S: It seems like when my hands are warm, they are move flexible.   PERTINENT HISTORY: Pt is a 77 y/o female presenting s/p right proximal humerus fracture after falling when cleaning up in the woods on 02/15/23. Pt attended therapy at [facility] approximately 4 times before transferring to this clinic.   PRECAUTIONS: Shoulder  WEIGHT BEARING RESTRICTIONS: Yes NWB  PAIN:  Are you having pain? No  FALLS: Has patient fallen in last 6 months? Yes. Number of falls 1  PLOF: Independent  PATIENT GOALS: To get the right arm moving again.   NEXT MD VISIT: 06/11/23  OBJECTIVE:  Note: Objective measures were completed at Evaluation unless otherwise noted.  HAND DOMINANCE: Right  ADLs: Overall ADLs: Pt is unable to use the RUE for ADLs. She can hold paper plate between her fingers. She is unable to make a fist or use her hand and arm. Pt is using the LUE for all tasks, using  hemi-dressing techniques and strategies for independence.    FUNCTIONAL OUTCOME MEASURES: Quick Dash: 72.73  UPPER EXTREMITY ROM:       Assessed in sitting, er/IR adducted  Passive ROM Right eval Right 05/07/23  Shoulder flexion 61 84  Shoulder abduction 54 66  Shoulder internal rotation 90 90  Shoulder external rotation 19 28  Elbow flexion 136 145  Elbow extension -30 -5  Wrist flexion 55 70  Wrist extension 8 22  Wrist ulnar deviation 25   Wrist radial  deviation 8 11  Wrist pronation 85 90  Wrist supination 85 90  (Blank rows = not tested)   Assessed in sitting, er/IR adducted  Active ROM Right eval Right 05/07/23  Shoulder flexion 33 68  Shoulder abduction 41 56  Shoulder internal rotation 90 90  Shoulder external rotation 20 34  Elbow flexion 128 142  Elbow extension -45 -10  Wrist flexion 55 60  Wrist extension 8 12  Wrist ulnar deviation 25   Wrist radial deviation 4 5  Wrist pronation 70 90  Wrist supination 80 88  (Blank rows = not tested)      Active ROM Right 05/07/23  Thumb MCP (0-60) 34  Thumb IP (0-80) 12  Index MCP (0-90) 50  Index PIP (0-100) 52  Index DIP (0-70)  12  Long MCP (0-90)  42  Long PIP (0-100)  40  Long DIP (0-70)  0  Ring MCP (0-90)  30  Ring PIP (0-100)  34  Ring DIP (0-70)  0  Little MCP (0-90)  32  Little PIP (0-100)  36  Little DIP (0-70)  0  (Blank rows = not tested)  UPPER EXTREMITY MMT:       Unable to assess due to pain, limited active movement   12/2: shoulder evaluated on observation; MMT for elbow and wrist  MMT Right eval  Shoulder flexion 3-/5  Shoulder abduction 3-/5  Shoulder internal rotation 3/5  Shoulder external rotation 3/5  Elbow flexion 4/5  Elbow extension 4-/5  Wrist flexion 3+/5  Wrist extension 3+/5  Wrist ulnar deviation 4/5  Wrist radial deviation 3+/5  Wrist pronation 4-/5  Wrist supination 4-/5  (Blank rows = not tested)  HAND FUNCTION: Unable, TBD   COORDINATION: Unable, TBD  SENSATION: WFL  EDEMA:  -Right  -Left Upper arm    32cm 30.5cm Elbow           29cm 24cm Mid-forearm 20cm 20.5cm Wrist            17cm 14.5cm Palm            18.5cm 17cm  -Right   Upper arm    31cm  Elbow           25.5cm Mid-forearm 20cm  Wrist            15.5cm  Palm            17.5cm   OBSERVATIONS: pt with generalized edema throughout RUE; joint stiffness in digits, wrist, elbow, and shoulder. Consistent with possibility of developing CRPS noting  dx of regional pain syndrome of RUE   TODAY'S TREATMENT:  DATE:  05/23/23 -Manual: retrograde massage and myofascial release to right hand and digits to decrease stiffness and increase joint ROM in RUE -P/ROM: passive stretching to right hand and digits, 5 reps -AA/ROM: supine-flexion, abduction, protraction, horizontal abduction, er/IR, 12 reps -Proximal shoulder strengthening: supine-paddles, criss cross, circles each direction, 10 reps -AA/ROM standing-protraction, flexion, abduction, 10 reps -Wall wash: 1' flexion -Joint Blocking: all digits PIP and DIP joints-5 reps  05/21/23 -Manual: retrograde massage and myofascial release to right hand and digits to decrease stiffness and increase joint ROM in RUE -P/ROM: passive stretching to right hand and digits, 5 reps -AA/ROM: supine-flexion, abduction, protraction, horizontal abduction, er/IR, 10 reps -Chair rail slide: 10 reps -AA/ROM standing-protraction, flexion, abduction, 10 reps -Pulleys: 1' flexion, 1' abduction  05/18/23 -Manual: retrograde massage and myofascial release to entire RUE to decrease stiffness and increase joint ROM in RUE -AA/ROM: supine-flexion, abduction, protraction, horizontal abduction, er/IR, 15 reps -A/ROM: supine-flexion, abduction, protraction, horizontal abduction, er/IR, 10 reps - A/ROM: wrist propped on blue foam; flexion and extension, radial/ulnar deviation 10x each  -UBE: level 1, 2' forward, 2' reverse, pace: 4.5  - Pinch and reach: clip tree; removing from bottom row and placing on vertical pole with red and green clips     PATIENT EDUCATION: Education details: reviewed HEP Person educated: Patient Education method: Explanation, Demonstration, Tactile cues, and Verbal cues Education comprehension: verbalized understanding and returned demonstration  HOME EXERCISE  PROGRAM: Eval: Provided shoulder length edema glove/sleeve, educated on donning/doffing, wear and care; continue table slides from [facility] therapist 11/8: AA/ROM in supine 11/15: wrist stretch into extension  11/27: flexion glove wear 12/9: Wall Climbs 12/11: Towel Roll gripping and wringing out  GOALS: Goals reviewed with patient? Yes  SHORT TERM GOALS: Target date: 05/09/23  Pt will be provided with and educated on HEP to improve RUE functioning required for use during ADLs.   Goal status: IN PROGRESS  2.  Pt will increase P/ROM of right shoulder by 50+ degrees to improve ability to perform dressing and bathing tasks independently.   Goal status: IN PROGRESS  3.  Pt will increase A/ROM of right elbow to full extension to improve ability to perform low level functional reaching tasks.   Goal status: IN PROGRESS  4.  Pt will decrease edema in RUE by 50% or greater to improve mobility in RUE required for active participation in bathing and grooming tasks.   Goal status: IN PROGRESS  5.  Pt will demonstrate ability to make at least 50% of a grasp to promote use when grasping large items such as a cup or water bottle.   Goal status: IN PROGRESS   LONG TERM GOALS: Target date: 06/09/23  Pt will decrease pain in the RUE to 2/10 or less to improve ability to sleep for 3+ consecutive hours without waking due to pain.   Goal status: IN PROGRESS  2.  Pt will increase RUE A/ROM to at least 120 degrees shoulder flexion/abduction, and wrist extension by 25+ degrees to improve use of RUE during meal preparation tasks.   Goal status: IN PROGRESS  3.  Pt will increase RUE strength to 4-/5 or greater to improve ability to use RUE during housework tasks.   Goal status: IN PROGRESS  4.  Pt will increase grip strength to at least 20# and pinch strength to at least 5# to improve ability to grasp and carry groceries.  Baseline:  Goal status: IN PROGRESS  5.  Pt will decrease RUE  fascial restrictions  to min amounts or less to improve ability to perform functional reaching tasks.   Goal status: IN PROGRESS   ASSESSMENT:  CLINICAL IMPRESSION: Continued with manual techniques to the right hand, min fascial restrictions in shoulder region today. Continued with AA/ROM in supine, pt with mod to max tightness at approximately 50% ROM limiting further ROM gains. Added proximal shoulder strengthening and digit joint blocking. Tightened 2nd and 5th digits for flexion glove. Verbal cuing for form and technique, no reports of increased pain at end of session.   PERFORMANCE DEFICITS: in functional skills including ADLs, IADLs, coordination, dexterity, proprioception, edema, ROM, strength, pain, fascial restrictions, Fine motor control, Gross motor control, and UE functional use    PLAN:  OT FREQUENCY: 3x/week  OT DURATION: 8 weeks  PLANNED INTERVENTIONS: 97168 OT Re-evaluation, 97535 self care/ADL training, 21308 therapeutic exercise, 97530 therapeutic activity, 97140 manual therapy, 97035 ultrasound, 97018 paraffin, 65784 moist heat, 97010 cryotherapy, 97032 electrical stimulation (manual), 97014 electrical stimulation unattended, 97760 Splinting (initial encounter), and DME and/or AE instructions  CONSULTED AND AGREED WITH PLAN OF CARE: Patient  PLAN FOR NEXT SESSION: retrograde massage and manual techniques, passive stretching progressing to AA/ROM and A/ROM as able to tolerate. Follow up on HEP; adjust flexion glove as able.    Ezra Sites, OTR/L  [phone] 05/23/2023, 12:24 PM

## 2023-05-25 ENCOUNTER — Ambulatory Visit (HOSPITAL_COMMUNITY): Payer: No Typology Code available for payment source | Admitting: Occupational Therapy

## 2023-05-25 ENCOUNTER — Encounter (HOSPITAL_COMMUNITY): Payer: Self-pay | Admitting: Occupational Therapy

## 2023-05-25 DIAGNOSIS — R29898 Other symptoms and signs involving the musculoskeletal system: Secondary | ICD-10-CM

## 2023-05-25 DIAGNOSIS — M25641 Stiffness of right hand, not elsewhere classified: Secondary | ICD-10-CM

## 2023-05-25 DIAGNOSIS — M25611 Stiffness of right shoulder, not elsewhere classified: Secondary | ICD-10-CM

## 2023-05-25 DIAGNOSIS — M25511 Pain in right shoulder: Secondary | ICD-10-CM

## 2023-05-25 DIAGNOSIS — M25621 Stiffness of right elbow, not elsewhere classified: Secondary | ICD-10-CM

## 2023-05-25 NOTE — Therapy (Signed)
OUTPATIENT OCCUPATIONAL THERAPY ORTHO TREATMENT  Patient Name: Stephanie Ramos MRN: [id number] DOB:1946/04/01, 77 y.o., female Today's Date: 05/25/2023    END OF SESSION:  OT End of Session - 05/25/23 1104     Visit Number 19    Number of Visits 24    Date for OT Re-Evaluation 06/09/23    Authorization Type [employer], copay: $15    Authorization Time Period no auth required    OT Start Time 1021    OT Stop Time 1103    OT Time Calculation (min) 42 min    Activity Tolerance Patient tolerated treatment well    Behavior During Therapy WFL for tasks assessed/performed             Past Medical History:  Diagnosis Date   High iron content of liver determined by magnetic resonance imaging 08/02/2021   Hyperlipemia    Past Surgical History:  Procedure Laterality Date   BREAST SURGERY     CATARACT EXTRACTION W/PHACO Left 08/11/2019   Procedure: CATARACT EXTRACTION PHACO AND INTRAOCULAR LENS PLACEMENT (IOC) (CDE: 9.68);  Surgeon: [name], MD;  Location: [facility];  Service: Ophthalmology;  Laterality: Left;   CATARACT EXTRACTION W/PHACO Right 09/29/2019   Procedure: CATARACT EXTRACTION PHACO AND INTRAOCULAR LENS PLACEMENT RIGHT EYE;  Surgeon: [name], MD;  Location: [facility];  Service: Ophthalmology;  Laterality: Right;  CDE: 10.60   DILITATION & CURRETTAGE/HYSTROSCOPY WITH ESSURE     Patient Active Problem List   Diagnosis Date Noted   Comminuted fracture of right humerus 03/29/2023   Preventative health care 03/10/2022   Hypercalcemia 09/08/2021   High iron content of liver determined by magnetic resonance imaging 08/02/2021   Hyperkalemia 07/21/2021   Elevated ferritin level 06/24/2021   Fatigue 05/20/2021   Iron deficiency 01/18/2021   Elevated BP without diagnosis of hypertension 01/18/2021   Overweight with body mass index (BMI) of 26 to 26.9 in adult 03/18/2020   Refused influenza vaccine 03/18/2020   Vertigo 12/28/2017   Hyperlipemia 12/28/2017    PCP: Dr. Christel Mormon REFERRING PROVIDER: Dr. Teryl Lucy  ONSET DATE: 02/15/23  REFERRING DIAG: rt prox humerus fx with regional pain syndrome of right upper etremity   THERAPY DIAG:  Acute pain of right shoulder  Stiffness of right shoulder, not elsewhere classified  Stiffness of right hand, not elsewhere classified  Stiffness of right elbow, not elsewhere classified  Other symptoms and signs involving the musculoskeletal system  Rationale for Evaluation and Treatment: Rehabilitation  SUBJECTIVE:   SUBJECTIVE STATEMENT: S: It seems like when my hands are warm, they are move flexible.   PERTINENT HISTORY: Pt is a 77 y/o female presenting s/p right proximal humerus fracture after falling when [place] in the woods on 02/15/23. Pt attended therapy at [facility] approximately 4 times before transferring to this clinic.   PRECAUTIONS: Shoulder  WEIGHT BEARING RESTRICTIONS: Yes NWB  PAIN:  Are you having pain? No  FALLS: Has patient fallen in last 6 months? Yes. Number of falls 1  PLOF: Independent  PATIENT GOALS: To get the right arm moving again.   NEXT MD VISIT: 06/11/23  OBJECTIVE:  Note: Objective measures were completed at Evaluation unless otherwise noted.  HAND DOMINANCE: Right  ADLs: Overall ADLs: Pt is unable to use the RUE for ADLs. She can hold paper plate between her fingers. She is unable to make a fist or use her hand and arm. Pt is using the LUE for all tasks, using hemi-dressing techniques and strategies  for independence.    FUNCTIONAL OUTCOME MEASURES: Quick Dash: 72.73  UPPER EXTREMITY ROM:       Assessed in sitting, er/IR adducted  Passive ROM Right eval Right 05/07/23  Shoulder flexion 61 84  Shoulder abduction 54 66  Shoulder internal rotation 90 90  Shoulder external rotation 19 28  Elbow flexion 136 145  Elbow extension -30 -5  Wrist flexion 55 70  Wrist extension 8 22  Wrist ulnar deviation 25   Wrist radial deviation  8 11  Wrist pronation 85 90  Wrist supination 85 90  (Blank rows = not tested)   Assessed in sitting, er/IR adducted  Active ROM Right eval Right 05/07/23  Shoulder flexion 33 68  Shoulder abduction 41 56  Shoulder internal rotation 90 90  Shoulder external rotation 20 34  Elbow flexion 128 142  Elbow extension -45 -10  Wrist flexion 55 60  Wrist extension 8 12  Wrist ulnar deviation 25   Wrist radial deviation 4 5  Wrist pronation 70 90  Wrist supination 80 88  (Blank rows = not tested)      Active ROM Right 05/07/23  Thumb MCP (0-60) 34  Thumb IP (0-80) 12  Index MCP (0-90) 50  Index PIP (0-100) 52  Index DIP (0-70)  12  Long MCP (0-90)  42  Long PIP (0-100)  40  Long DIP (0-70)  0  Ring MCP (0-90)  30  Ring PIP (0-100)  34  Ring DIP (0-70)  0  Little MCP (0-90)  32  Little PIP (0-100)  36  Little DIP (0-70)  0  (Blank rows = not tested)  UPPER EXTREMITY MMT:       Unable to assess due to pain, limited active movement   12/2: shoulder evaluated on observation; MMT for elbow and wrist  MMT Right eval  Shoulder flexion 3-/5  Shoulder abduction 3-/5  Shoulder internal rotation 3/5  Shoulder external rotation 3/5  Elbow flexion 4/5  Elbow extension 4-/5  Wrist flexion 3+/5  Wrist extension 3+/5  Wrist ulnar deviation 4/5  Wrist radial deviation 3+/5  Wrist pronation 4-/5  Wrist supination 4-/5  (Blank rows = not tested)  HAND FUNCTION: Unable, TBD   COORDINATION: Unable, TBD  SENSATION: WFL  EDEMA:  -Right  -Left Upper arm    32cm 30.5cm Elbow           29cm 24cm Mid-forearm 20cm 20.5cm Wrist            17cm 14.5cm Palm            18.5cm 17cm  -Right   Upper arm    31cm  Elbow           25.5cm Mid-forearm 20cm  Wrist            15.5cm  Palm            17.5cm   OBSERVATIONS: pt with generalized edema throughout RUE; joint stiffness in digits, wrist, elbow, and shoulder. Consistent with possibility of developing CRPS noting dx of  regional pain syndrome of RUE   TODAY'S TREATMENT:  DATE:   05/25/23 -Manual: retrograde massage and myofascial release to right hand and digits to decrease stiffness and increase joint ROM in RUE -P/ROM: passive stretching to right hand and digits, 5 reps -P/ROM: wrist flexion, extension, ulnar/radial deviation, supination/pronation, x10 -AA/ROM: seated-flexion, abduction, protraction, horizontal abduction, er/IR, x15 -A/ROM: seated- flexion, abduction, protraction, horizontal abduction, er/IR, x10 -Wall Climbs: flexion, x10  05/23/23 -Manual: retrograde massage and myofascial release to right hand and digits to decrease stiffness and increase joint ROM in RUE -P/ROM: passive stretching to right hand and digits, 5 reps -AA/ROM: supine-flexion, abduction, protraction, horizontal abduction, er/IR, 12 reps -Proximal shoulder strengthening: supine-paddles, criss cross, circles each direction, 10 reps -AA/ROM standing-protraction, flexion, abduction, 10 reps -Wall wash: 1' flexion -Joint Blocking: all digits PIP and DIP joints-5 reps  05/21/23 -Manual: retrograde massage and myofascial release to right hand and digits to decrease stiffness and increase joint ROM in RUE -P/ROM: passive stretching to right hand and digits, 5 reps -AA/ROM: supine-flexion, abduction, protraction, horizontal abduction, er/IR, 10 reps -Chair rail slide: 10 reps -AA/ROM standing-protraction, flexion, abduction, 10 reps -Pulleys: 1' flexion, 1' abduction   PATIENT EDUCATION: Education details: A/ROM Person educated: Patient Education method: Programmer, multimedia, Demonstration, Tactile cues, and Verbal cues Education comprehension: verbalized understanding and returned demonstration  HOME EXERCISE PROGRAM: Eval: Provided shoulder length edema glove/sleeve, educated on donning/doffing, wear and  care; continue table slides from [facility] therapist 11/8: AA/ROM in supine 11/15: wrist stretch into extension  11/27: flexion glove wear 12/9: Wall Climbs 12/11: Towel Roll gripping and wringing out 12/20: A/ROM  GOALS: Goals reviewed with patient? Yes  SHORT TERM GOALS: Target date: 05/09/23  Pt will be provided with and educated on HEP to improve RUE functioning required for use during ADLs.   Goal status: IN PROGRESS  2.  Pt will increase P/ROM of right shoulder by 50+ degrees to improve ability to perform dressing and bathing tasks independently.   Goal status: IN PROGRESS  3.  Pt will increase A/ROM of right elbow to full extension to improve ability to perform low level functional reaching tasks.   Goal status: IN PROGRESS  4.  Pt will decrease edema in RUE by 50% or greater to improve mobility in RUE required for active participation in bathing and grooming tasks.   Goal status: IN PROGRESS  5.  Pt will demonstrate ability to make at least 50% of a grasp to promote use when grasping large items such as a cup or water bottle.   Goal status: IN PROGRESS   LONG TERM GOALS: Target date: 06/09/23  Pt will decrease pain in the RUE to 2/10 or less to improve ability to sleep for 3+ consecutive hours without waking due to pain.   Goal status: IN PROGRESS  2.  Pt will increase RUE A/ROM to at least 120 degrees shoulder flexion/abduction, and wrist extension by 25+ degrees to improve use of RUE during meal preparation tasks.   Goal status: IN PROGRESS  3.  Pt will increase RUE strength to 4-/5 or greater to improve ability to use RUE during housework tasks.   Goal status: IN PROGRESS  4.  Pt will increase grip strength to at least 20# and pinch strength to at least 5# to improve ability to grasp and carry groceries.  Baseline:  Goal status: IN PROGRESS  5.  Pt will decrease RUE fascial restrictions to min amounts or less to improve ability to perform functional  reaching tasks.   Goal status: IN PROGRESS   ASSESSMENT:  CLINICAL IMPRESSION: This session, pt continuing to demonstrate decreased pain and discomfort. She is achieving 50% of full ROM with AA/ROM and 40-45% of full ROM with A/ROM. OT adding wall climbs for continued stretching for functional reaching. Verbal and tactile cuing provided for positioning and technique.   PERFORMANCE DEFICITS: in functional skills including ADLs, IADLs, coordination, dexterity, proprioception, edema, ROM, strength, pain, fascial restrictions, Fine motor control, Gross motor control, and UE functional use    PLAN:  OT FREQUENCY: 3x/week  OT DURATION: 8 weeks  PLANNED INTERVENTIONS: 97168 OT Re-evaluation, 97535 self care/ADL training, 16109 therapeutic exercise, 97530 therapeutic activity, 97140 manual therapy, 97035 ultrasound, 97018 paraffin, 60454 moist heat, 97010 cryotherapy, 97032 electrical stimulation (manual), 97014 electrical stimulation unattended, 97760 Splinting (initial encounter), and DME and/or AE instructions  CONSULTED AND AGREED WITH PLAN OF CARE: Patient  PLAN FOR NEXT SESSION: retrograde massage and manual techniques, passive stretching progressing to AA/ROM and A/ROM as able to tolerate. Follow up on HEP; adjust flexion glove as able.    Trish Mage, OTR/L [phone] 05/25/2023, 11:06 AM

## 2023-05-25 NOTE — Patient Instructions (Signed)

## 2023-06-04 ENCOUNTER — Encounter (HOSPITAL_COMMUNITY): Payer: Self-pay | Admitting: Occupational Therapy

## 2023-06-04 ENCOUNTER — Ambulatory Visit (HOSPITAL_COMMUNITY): Payer: No Typology Code available for payment source | Admitting: Occupational Therapy

## 2023-06-04 DIAGNOSIS — M25511 Pain in right shoulder: Secondary | ICD-10-CM

## 2023-06-04 DIAGNOSIS — M25611 Stiffness of right shoulder, not elsewhere classified: Secondary | ICD-10-CM

## 2023-06-04 DIAGNOSIS — M25621 Stiffness of right elbow, not elsewhere classified: Secondary | ICD-10-CM

## 2023-06-04 DIAGNOSIS — M25641 Stiffness of right hand, not elsewhere classified: Secondary | ICD-10-CM

## 2023-06-04 DIAGNOSIS — R29898 Other symptoms and signs involving the musculoskeletal system: Secondary | ICD-10-CM

## 2023-06-04 NOTE — Therapy (Signed)
OUTPATIENT OCCUPATIONAL THERAPY ORTHO TREATMENT  Patient Name: Stephanie Ramos MRN: [id number] DOB:Oct 14, 1945, 77 y.o., female Today's Date: 06/04/2023    END OF SESSION:  OT End of Session - 06/04/23 1429     Visit Number 20    Number of Visits 24    Date for OT Re-Evaluation 06/09/23    Authorization Type [employer], copay: $15    Authorization Time Period no auth required    OT Start Time 1350    OT Stop Time 1430    OT Time Calculation (min) 40 min    Activity Tolerance Patient tolerated treatment well    Behavior During Therapy WFL for tasks assessed/performed              Past Medical History:  Diagnosis Date   High iron content of liver determined by magnetic resonance imaging 08/02/2021   Hyperlipemia    Past Surgical History:  Procedure Laterality Date   BREAST SURGERY     CATARACT EXTRACTION W/PHACO Left 08/11/2019   Procedure: CATARACT EXTRACTION PHACO AND INTRAOCULAR LENS PLACEMENT (IOC) (CDE: 9.68);  Surgeon: [name], MD;  Location: [facility];  Service: Ophthalmology;  Laterality: Left;   CATARACT EXTRACTION W/PHACO Right 09/29/2019   Procedure: CATARACT EXTRACTION PHACO AND INTRAOCULAR LENS PLACEMENT RIGHT EYE;  Surgeon: [name], MD;  Location: [facility];  Service: Ophthalmology;  Laterality: Right;  CDE: 10.60   DILITATION & CURRETTAGE/HYSTROSCOPY WITH ESSURE     Patient Active Problem List   Diagnosis Date Noted   Comminuted fracture of right humerus 03/29/2023   Preventative health care 03/10/2022   Hypercalcemia 09/08/2021   High iron content of liver determined by magnetic resonance imaging 08/02/2021   Hyperkalemia 07/21/2021   Elevated ferritin level 06/24/2021   Fatigue 05/20/2021   Iron deficiency 01/18/2021   Elevated BP without diagnosis of hypertension 01/18/2021   Overweight with body mass index (BMI) of 26 to 26.9 in adult 03/18/2020   Refused influenza vaccine 03/18/2020   Vertigo 12/28/2017   Hyperlipemia 12/28/2017    PCP: Dr. Christel Mormon REFERRING PROVIDER: Dr. Teryl Lucy  ONSET DATE: 02/15/23  REFERRING DIAG: rt prox humerus fx with regional pain syndrome of right upper etremity   THERAPY DIAG:  Acute pain of right shoulder  Stiffness of right shoulder, not elsewhere classified  Stiffness of right hand, not elsewhere classified  Stiffness of right elbow, not elsewhere classified  Other symptoms and signs involving the musculoskeletal system  Rationale for Evaluation and Treatment: Rehabilitation  SUBJECTIVE:   SUBJECTIVE STATEMENT: S: I have been practicing writing.   PERTINENT HISTORY: Pt is a 77 y/o female presenting s/p right proximal humerus fracture after falling when cleaning up in the woods on 02/15/23. Pt attended therapy at [facility] approximately 4 times before transferring to this clinic.   PRECAUTIONS: Shoulder  WEIGHT BEARING RESTRICTIONS: Yes NWB  PAIN:  Are you having pain? No  FALLS: Has patient fallen in last 6 months? Yes. Number of falls 1  PLOF: Independent  PATIENT GOALS: To get the right arm moving again.   NEXT MD VISIT: 06/11/23  OBJECTIVE:  Note: Objective measures were completed at Evaluation unless otherwise noted.  HAND DOMINANCE: Right  ADLs: Overall ADLs: Pt is unable to use the RUE for ADLs. She can hold paper plate between her fingers. She is unable to make a fist or use her hand and arm. Pt is using the LUE for all tasks, using hemi-dressing techniques and strategies for independence.    FUNCTIONAL  OUTCOME MEASURES: Quick Dash: 72.73  UPPER EXTREMITY ROM:       Assessed in sitting, er/IR adducted  Passive ROM Right eval Right 05/07/23  Shoulder flexion 61 84  Shoulder abduction 54 66  Shoulder internal rotation 90 90  Shoulder external rotation 19 28  Elbow flexion 136 145  Elbow extension -30 -5  Wrist flexion 55 70  Wrist extension 8 22  Wrist ulnar deviation 25   Wrist radial deviation 8 11  Wrist pronation 85 90   Wrist supination 85 90  (Blank rows = not tested)   Assessed in sitting, er/IR adducted  Active ROM Right eval Right 05/07/23  Shoulder flexion 33 68  Shoulder abduction 41 56  Shoulder internal rotation 90 90  Shoulder external rotation 20 34  Elbow flexion 128 142  Elbow extension -45 -10  Wrist flexion 55 60  Wrist extension 8 12  Wrist ulnar deviation 25   Wrist radial deviation 4 5  Wrist pronation 70 90  Wrist supination 80 88  (Blank rows = not tested)      Active ROM Right 05/07/23  Thumb MCP (0-60) 34  Thumb IP (0-80) 12  Index MCP (0-90) 50  Index PIP (0-100) 52  Index DIP (0-70)  12  Long MCP (0-90)  42  Long PIP (0-100)  40  Long DIP (0-70)  0  Ring MCP (0-90)  30  Ring PIP (0-100)  34  Ring DIP (0-70)  0  Little MCP (0-90)  32  Little PIP (0-100)  36  Little DIP (0-70)  0  (Blank rows = not tested)  UPPER EXTREMITY MMT:       Unable to assess due to pain, limited active movement   12/2: shoulder evaluated on observation; MMT for elbow and wrist  MMT Right eval  Shoulder flexion 3-/5  Shoulder abduction 3-/5  Shoulder internal rotation 3/5  Shoulder external rotation 3/5  Elbow flexion 4/5  Elbow extension 4-/5  Wrist flexion 3+/5  Wrist extension 3+/5  Wrist ulnar deviation 4/5  Wrist radial deviation 3+/5  Wrist pronation 4-/5  Wrist supination 4-/5  (Blank rows = not tested)  HAND FUNCTION: Unable, TBD   COORDINATION: Unable, TBD  SENSATION: WFL  EDEMA:  -Right  -Left Upper arm    32cm 30.5cm Elbow           29cm 24cm Mid-forearm 20cm 20.5cm Wrist            17cm 14.5cm Palm            18.5cm 17cm  -Right   Upper arm    31cm  Elbow           25.5cm Mid-forearm 20cm  Wrist            15.5cm  Palm            17.5cm   OBSERVATIONS: pt with generalized edema throughout RUE; joint stiffness in digits, wrist, elbow, and shoulder. Consistent with possibility of developing CRPS noting dx of regional pain syndrome of  RUE   TODAY'S TREATMENT:  DATE:   06/04/23 -Manual: retrograde massage and myofascial release to right hand and digits to decrease stiffness and increase joint ROM in RUE -P/ROM: passive stretching to right hand and digits, 5 reps -Joint Blocking: all digits PIP and DIP joints-5 reps -P/ROM: wrist flexion, extension, ulnar/radial deviation, supination/pronation, x10 -AA/ROM: supine; flexion, abduction, protraction, horizontal abduction, er/IR, x10 -AA/ROM: seated-flexion, abduction, protraction, horizontal abduction, er/IR, x10 -Wall Climbs: flexion, x10  05/25/23 -Manual: retrograde massage and myofascial release to right hand and digits to decrease stiffness and increase joint ROM in RUE -P/ROM: passive stretching to right hand and digits, 5 reps -P/ROM: wrist flexion, extension, ulnar/radial deviation, supination/pronation, x10 -AA/ROM: seated-flexion, abduction, protraction, horizontal abduction, er/IR, x15 -A/ROM: seated- flexion, abduction, protraction, horizontal abduction, er/IR, x10 -Wall Climbs: flexion, x10  05/23/23 -Manual: retrograde massage and myofascial release to right hand and digits to decrease stiffness and increase joint ROM in RUE -P/ROM: passive stretching to right hand and digits, 5 reps -AA/ROM: supine-flexion, abduction, protraction, horizontal abduction, er/IR, 12 reps -Proximal shoulder strengthening: supine-paddles, criss cross, circles each direction, 10 reps -AA/ROM standing-protraction, flexion, abduction, 10 reps -Wall wash: 1' flexion -Joint Blocking: all digits PIP and DIP joints-5 reps   PATIENT EDUCATION: Education details: A/ROM Person educated: Patient Education method: Programmer, multimedia, Demonstration, Actor cues, and Verbal cues Education comprehension: verbalized understanding and returned demonstration  HOME EXERCISE  PROGRAM: Eval: Provided shoulder length edema glove/sleeve, educated on donning/doffing, wear and care; continue table slides from [facility] therapist 11/8: AA/ROM in supine 11/15: wrist stretch into extension  11/27: flexion glove wear 12/9: Wall Climbs 12/11: Towel Roll gripping and wringing out 12/20: A/ROM 12/30: shoulder stretch at wall   GOALS: Goals reviewed with patient? Yes  SHORT TERM GOALS: Target date: 05/09/23  Pt will be provided with and educated on HEP to improve RUE functioning required for use during ADLs.   Goal status: IN PROGRESS  2.  Pt will increase P/ROM of right shoulder by 50+ degrees to improve ability to perform dressing and bathing tasks independently.   Goal status: IN PROGRESS  3.  Pt will increase A/ROM of right elbow to full extension to improve ability to perform low level functional reaching tasks.   Goal status: IN PROGRESS  4.  Pt will decrease edema in RUE by 50% or greater to improve mobility in RUE required for active participation in bathing and grooming tasks.   Goal status: IN PROGRESS  5.  Pt will demonstrate ability to make at least 50% of a grasp to promote use when grasping large items such as a cup or water bottle.   Goal status: IN PROGRESS   LONG TERM GOALS: Target date: 06/09/23  Pt will decrease pain in the RUE to 2/10 or less to improve ability to sleep for 3+ consecutive hours without waking due to pain.   Goal status: IN PROGRESS  2.  Pt will increase RUE A/ROM to at least 120 degrees shoulder flexion/abduction, and wrist extension by 25+ degrees to improve use of RUE during meal preparation tasks.   Goal status: IN PROGRESS  3.  Pt will increase RUE strength to 4-/5 or greater to improve ability to use RUE during housework tasks.   Goal status: IN PROGRESS  4.  Pt will increase grip strength to at least 20# and pinch strength to at least 5# to improve ability to grasp and carry groceries.  Baseline:  Goal  status: IN PROGRESS  5.  Pt will decrease RUE fascial restrictions to min amounts  or less to improve ability to perform functional reaching tasks.   Goal status: IN PROGRESS   ASSESSMENT:   CLINICAL IMPRESSION: Pt demonstrating increased AA/ROM in supine flexion up to 75% this session. Continued with AA/ROM in supine and seated. Added in shoulder stretches at wall. Gave pt new rubber band for glove. Rest breaks given throughout due to fatigue.   PERFORMANCE DEFICITS: in functional skills including ADLs, IADLs, coordination, dexterity, proprioception, edema, ROM, strength, pain, fascial restrictions, Fine motor control, Gross motor control, and UE functional use    PLAN:  OT FREQUENCY: 3x/week  OT DURATION: 8 weeks  PLANNED INTERVENTIONS: 97168 OT Re-evaluation, 97535 self care/ADL training, 64332 therapeutic exercise, 97530 therapeutic activity, 97140 manual therapy, 97035 ultrasound, 97018 paraffin, 95188 moist heat, 97010 cryotherapy, 97032 electrical stimulation (manual), 97014 electrical stimulation unattended, 97760 Splinting (initial encounter), and DME and/or AE instructions  CONSULTED AND AGREED WITH PLAN OF CARE: Patient  PLAN FOR NEXT SESSION: retrograde massage and manual techniques, passive stretching progressing to AA/ROM and A/ROM as able to tolerate. Follow up on HEP; adjust flexion glove as able.    Lurena Joiner, OTR/L [phone] 06/04/2023, 2:30 PM

## 2023-06-04 NOTE — Patient Instructions (Signed)

## 2023-06-08 ENCOUNTER — Ambulatory Visit (HOSPITAL_COMMUNITY): Payer: No Typology Code available for payment source | Attending: Orthopedic Surgery | Admitting: Occupational Therapy

## 2023-06-08 ENCOUNTER — Encounter (HOSPITAL_COMMUNITY): Payer: Self-pay | Admitting: Occupational Therapy

## 2023-06-08 DIAGNOSIS — M25621 Stiffness of right elbow, not elsewhere classified: Secondary | ICD-10-CM | POA: Diagnosis not present

## 2023-06-08 DIAGNOSIS — M25511 Pain in right shoulder: Secondary | ICD-10-CM | POA: Diagnosis not present

## 2023-06-08 DIAGNOSIS — M25641 Stiffness of right hand, not elsewhere classified: Secondary | ICD-10-CM | POA: Diagnosis not present

## 2023-06-08 DIAGNOSIS — R29898 Other symptoms and signs involving the musculoskeletal system: Secondary | ICD-10-CM | POA: Diagnosis not present

## 2023-06-08 DIAGNOSIS — M25611 Stiffness of right shoulder, not elsewhere classified: Secondary | ICD-10-CM | POA: Diagnosis not present

## 2023-06-08 NOTE — Therapy (Signed)
 OUTPATIENT OCCUPATIONAL THERAPY ORTHO TREATMENT REASSESSMENT AND RECERTIFICATION  Patient Name: Stephanie Ramos MRN: [id number] DOB:[birth year], 78 y.o., female Today's Date: 06/08/2023    END OF SESSION:  OT End of Session - 06/08/23 1517     Visit Number 21    Number of Visits 37    Date for OT Re-Evaluation 08/07/23    Authorization Type [employer], copay: $15    Authorization Time Period no auth required    OT Start Time 1346    OT Stop Time 1429    OT Time Calculation (min) 43 min    Activity Tolerance Patient tolerated treatment well    Behavior During Therapy WFL for tasks assessed/performed               Past Medical History:  Diagnosis Date   High iron content of liver determined by magnetic resonance imaging 08/02/2021   Hyperlipemia    Past Surgical History:  Procedure Laterality Date   BREAST SURGERY     CATARACT EXTRACTION W/PHACO Left 08/11/2019   Procedure: CATARACT EXTRACTION PHACO AND INTRAOCULAR LENS PLACEMENT (IOC) (CDE: 9.68);  Surgeon: Harrie Agent, MD;  Location: [facility];  Service: Ophthalmology;  Laterality: Left;   CATARACT EXTRACTION W/PHACO Right 09/29/2019   Procedure: CATARACT EXTRACTION PHACO AND INTRAOCULAR LENS PLACEMENT RIGHT EYE;  Surgeon: Harrie Agent, MD;  Location: [facility];  Service: Ophthalmology;  Laterality: Right;  CDE: 10.60   DILITATION & CURRETTAGE/HYSTROSCOPY WITH ESSURE     Patient Active Problem List   Diagnosis Date Noted   Comminuted fracture of right humerus 03/29/2023   Preventative health care 03/10/2022   Hypercalcemia 09/08/2021   High iron content of liver determined by magnetic resonance imaging 08/02/2021   Hyperkalemia 07/21/2021   Elevated ferritin level 06/24/2021   Fatigue 05/20/2021   Iron deficiency 01/18/2021   Elevated BP without diagnosis of hypertension 01/18/2021   Overweight with body mass index (BMI) of 26 to 26.9 in adult 03/18/2020   Refused influenza vaccine 03/18/2020   Vertigo  12/28/2017   Hyperlipemia 12/28/2017   PCP: Dr. Manus Fireman REFERRING PROVIDER: Dr. Fonda Olmsted  ONSET DATE: 02/15/23  REFERRING DIAG: rt prox humerus fx with regional pain syndrome of right upper etremity   THERAPY DIAG:  Acute pain of right shoulder  Stiffness of right shoulder, not elsewhere classified  Stiffness of right hand, not elsewhere classified  Stiffness of right elbow, not elsewhere classified  Other symptoms and signs involving the musculoskeletal system  Rationale for Evaluation and Treatment: Rehabilitation  SUBJECTIVE:   SUBJECTIVE STATEMENT: S: I got my fingers apart and a little straighter.   PERTINENT HISTORY: Pt is a 78 y/o female presenting s/p right proximal humerus fracture after falling when [place] up in the woods on 02/15/23. Pt attended therapy at [facility] approximately 4 times before transferring to this clinic.   PRECAUTIONS: Shoulder  WEIGHT BEARING RESTRICTIONS: Yes NWB  PAIN:  Are you having pain? Yes: NPRS scale: 1/10 Pain location: anterior shoulder Pain description: sore Aggravating factors: weather Relieving factors: unsure  FALLS: Has patient fallen in last 6 months? Yes. Number of falls 1  PLOF: Independent  PATIENT GOALS: To get the right arm moving again.   NEXT MD VISIT: 06/11/23  OBJECTIVE:  Note: Objective measures were completed at Evaluation unless otherwise noted.  HAND DOMINANCE: Right  ADLs: Overall ADLs: Pt is unable to use the RUE for ADLs. She can hold paper plate between her fingers. She is unable to make a fist or  use her hand and arm. Pt is using the LUE for all tasks, using hemi-dressing techniques and strategies for independence.    FUNCTIONAL OUTCOME MEASURES: Quick Dash: 72.73 06/08/23: 18.18  UPPER EXTREMITY ROM:       Assessed in sitting, er/IR adducted  Passive ROM Right eval Right 05/07/23 Right 06/08/23  Shoulder flexion 61 84 88  Shoulder abduction 54 66 77  Shoulder internal  rotation 90 90 90  Shoulder external rotation 19 28 28   Elbow flexion 136 145 145  Elbow extension -30 -5 -5  Wrist flexion 55 70 75  Wrist extension 8 22 48  Wrist ulnar deviation 25    Wrist radial deviation 8 11 15   Wrist pronation 85 90   Wrist supination 85 90   (Blank rows = not tested)   Assessed in sitting, er/IR adducted  Active ROM Right eval Right 05/07/23 Right 06/08/23  Shoulder flexion 33 68 81  Shoulder abduction 41 56 71  Shoulder internal rotation 90 90 90  Shoulder external rotation 20 34 35  Elbow flexion 128 142 146  Elbow extension -45 -10 -10  Wrist flexion 55 60 62  Wrist extension 8 12 30   Wrist ulnar deviation 25    Wrist radial deviation 4 5 8   Wrist pronation 70 90 90  Wrist supination 80 88 88  (Blank rows = not tested)      Active ROM Right 05/07/23 Right 06/08/23  Thumb MCP (0-60) 34 36  Thumb IP (0-80) 12 26  Index MCP (0-90) 50 50  Index PIP (0-100) 52 64  Index DIP (0-70)  12 20  Long MCP (0-90)  42 48  Long PIP (0-100)  40 52  Long DIP (0-70)  0 0  Ring MCP (0-90)  30 44  Ring PIP (0-100)  34 54  Ring DIP (0-70)  0 0  Little MCP (0-90)  32 32  Little PIP (0-100)  36 52  Little DIP (0-70)  0 0  (Blank rows = not tested)  UPPER EXTREMITY MMT:       Unable to assess due to pain, limited active movement   12/2: shoulder evaluated on observation; MMT for elbow and wrist  MMT Right eval Right 06/08/23  Shoulder flexion 3-/5 3-/5  Shoulder abduction 3-/5 3-/5  Shoulder internal rotation 3/5 3/5  Shoulder external rotation 3/5 3/5  Elbow flexion 4/5 4+/5  Elbow extension 4-/5 4-/5  Wrist flexion 3+/5 4/5  Wrist extension 3+/5 4-/5  Wrist ulnar deviation 4/5 4/5  Wrist radial deviation 3+/5 4/5  Wrist pronation 4-/5 4+/5  Wrist supination 4-/5 4+/5  (Blank rows = not tested)  HAND FUNCTION: Unable, TBD   COORDINATION: Unable, TBD  SENSATION: WFL  EDEMA:  -Right  -Left Upper arm    32cm 30.5cm Elbow            29cm 24cm Mid-forearm 20cm 20.5cm Wrist            17cm 14.5cm Palm            18.5cm 17cm  05/07/23 -Right   Upper arm    31cm  Elbow           25.5cm Mid-forearm 20cm  Wrist            15.5cm  Palm            17.5cm   06/08/23 -Right   Upper arm    30.5cm  Elbow  26.5cm Mid-forearm  20.75cm  Wrist            15.5cm  Palm            17.25cm  OBSERVATIONS: pt with generalized edema throughout RUE; joint stiffness in digits, wrist, elbow, and shoulder. Consistent with possibility of developing CRPS noting dx of regional pain syndrome of RUE   TODAY'S TREATMENT:                                                                                                                              DATE:  06/08/23 -Manual: retrograde massage and myofascial release to right hand and digits to decrease stiffness and increase joint ROM in RUE -P/ROM: passive stretching to right hand and digits, 5 reps -AA/ROM: seated-flexion, abduction, protraction, 10 reps -Functional reaching: pt placing 5 cones on bottom shelf of overhead cabinet in flexion, removing in abduction -[facility] pass behind back for IR and behind head for er, 10 reps  06/04/23 -Manual: retrograde massage and myofascial release to right hand and digits to decrease stiffness and increase joint ROM in RUE -P/ROM: passive stretching to right hand and digits, 5 reps -Joint Blocking: all digits PIP and DIP joints-5 reps -P/ROM: wrist flexion, extension, ulnar/radial deviation, supination/pronation, x10 -AA/ROM: supine; flexion, abduction, protraction, horizontal abduction, er/IR, x10 -AA/ROM: seated-flexion, abduction, protraction, horizontal abduction, er/IR, x10 -Wall Climbs: flexion, x10  05/25/23 -Manual: retrograde massage and myofascial release to right hand and digits to decrease stiffness and increase joint ROM in RUE -P/ROM: passive stretching to right hand and digits, 5 reps -P/ROM: wrist flexion, extension, ulnar/radial  deviation, supination/pronation, x10 -AA/ROM: seated-flexion, abduction, protraction, horizontal abduction, er/IR, x15 -A/ROM: seated- flexion, abduction, protraction, horizontal abduction, er/IR, x10 -Wall Climbs: flexion, x10   PATIENT EDUCATION: Education details: reviewed HEP Person educated: Patient Education method: Programmer, Multimedia, Demonstration, Tactile cues, and Verbal cues Education comprehension: verbalized understanding and returned demonstration  HOME EXERCISE PROGRAM: Eval: Provided shoulder length edema glove/sleeve, educated on donning/doffing, wear and care; continue table slides from [facility] therapist 11/8: AA/ROM in supine 11/15: wrist stretch into extension  11/27: flexion glove wear 12/9: Wall Climbs 12/11: Towel Roll gripping and wringing out 12/20: A/ROM 12/30: shoulder stretch at wall   GOALS: Goals reviewed with patient? Yes  SHORT TERM GOALS: Target date: 05/09/23  Pt will be provided with and educated on HEP to improve RUE functioning required for use during ADLs.   Goal status: IN PROGRESS  2.  Pt will increase P/ROM of right shoulder by 50+ degrees to improve ability to perform dressing and bathing tasks independently.   Goal status: IN PROGRESS  3.  Pt will increase A/ROM of right elbow to full extension to improve ability to perform low level functional reaching tasks.   Goal status: IN PROGRESS  4.  Pt will decrease edema in RUE by 50% or greater to improve mobility in RUE required for active participation in bathing and grooming tasks.   Goal  status: MET  5.  Pt will demonstrate ability to make at least 50% of a grasp to promote use when grasping large items such as a cup or water bottle.   Goal status: IN PROGRESS   LONG TERM GOALS: Target date: 06/09/23  Pt will decrease pain in the RUE to 2/10 or less to improve ability to sleep for 3+ consecutive hours without waking due to pain.   Goal status: IN PROGRESS  2.  Pt will increase  RUE A/ROM to at least 120 degrees shoulder flexion/abduction, and wrist extension by 25+ degrees to improve use of RUE during meal preparation tasks.   Goal status: IN PROGRESS  3.  Pt will increase RUE strength to 4-/5 or greater to improve ability to use RUE during housework tasks.   Goal status: IN PROGRESS  4.  Pt will increase grip strength to at least 20# and pinch strength to at least 5# to improve ability to grasp and carry groceries.  Baseline:  Goal status: IN PROGRESS  5.  Pt will decrease RUE fascial restrictions to min amounts or less to improve ability to perform functional reaching tasks.   Goal status: IN PROGRESS   ASSESSMENT:   CLINICAL IMPRESSION: Reassessment completed this session. Pt has met 1 STG and is making slow progress towards remaining goals. Pt has improved ROM and strength in digits, wrist, and shoulder. Pt is able to complete her ADLs, however has to modify due to ROM and strength limitations. She can now make approximately 25% of a grasp/fist and has active movement of the shoulder to approximately 50% ROM. Pt reports she does not have pain, has some low level soreness. Continues to display signs of CRPS in the hand and digits, as well as restricted motion in the shoulder. Pt will continue to benefit from skilled OT services to address ROM and strength limitations in the RUE.   PERFORMANCE DEFICITS: in functional skills including ADLs, IADLs, coordination, dexterity, proprioception, edema, ROM, strength, pain, fascial restrictions, Fine motor control, Gross motor control, and UE functional use    PLAN:  OT FREQUENCY: 2x/week  OT DURATION: 8 weeks  PLANNED INTERVENTIONS: 97168 OT Re-evaluation, 97535 self care/ADL training, 02889 therapeutic exercise, 97530 therapeutic activity, 97140 manual therapy, 97035 ultrasound, 97018 paraffin, 02989 moist heat, 97010 cryotherapy, 97032 electrical stimulation (manual), 97014 electrical stimulation unattended,  97760 Splinting (initial encounter), and DME and/or AE instructions  CONSULTED AND AGREED WITH PLAN OF CARE: Patient  PLAN FOR NEXT SESSION: retrograde massage and manual techniques, passive stretching progressing to AA/ROM and A/ROM as able to tolerate. Follow up on HEP; adjust flexion glove as able. Begin session with paraffin to hand    Sonny Cory, OTR/L  [phone] 06/08/2023, 3:18 PM

## 2023-06-11 DIAGNOSIS — S42294D Other nondisplaced fracture of upper end of right humerus, subsequent encounter for fracture with routine healing: Secondary | ICD-10-CM | POA: Diagnosis not present

## 2023-06-12 ENCOUNTER — Encounter (HOSPITAL_COMMUNITY): Payer: Self-pay | Admitting: Occupational Therapy

## 2023-06-12 ENCOUNTER — Ambulatory Visit (HOSPITAL_COMMUNITY): Payer: No Typology Code available for payment source | Admitting: Occupational Therapy

## 2023-06-12 DIAGNOSIS — M25641 Stiffness of right hand, not elsewhere classified: Secondary | ICD-10-CM

## 2023-06-12 DIAGNOSIS — R29898 Other symptoms and signs involving the musculoskeletal system: Secondary | ICD-10-CM

## 2023-06-12 DIAGNOSIS — M25511 Pain in right shoulder: Secondary | ICD-10-CM | POA: Diagnosis not present

## 2023-06-12 DIAGNOSIS — M25621 Stiffness of right elbow, not elsewhere classified: Secondary | ICD-10-CM

## 2023-06-12 DIAGNOSIS — M25611 Stiffness of right shoulder, not elsewhere classified: Secondary | ICD-10-CM

## 2023-06-12 NOTE — Therapy (Signed)
 OUTPATIENT OCCUPATIONAL THERAPY ORTHO TREATMENT   Patient Name: Stephanie Ramos MRN: [id number] DOB:1946/06/01, 78 y.o., female Today's Date: 06/12/2023    END OF SESSION:  OT End of Session - 06/12/23 1343     Visit Number 22    Number of Visits 37    Date for OT Re-Evaluation 08/07/23    Authorization Type [employer], copay: $15    Authorization Time Period no auth required    OT Start Time 1303    OT Stop Time 1343    OT Time Calculation (min) 40 min    Activity Tolerance Patient tolerated treatment well    Behavior During Therapy [facility] for tasks assessed/performed                Past Medical History:  Diagnosis Date   High iron content of liver determined by magnetic resonance imaging 08/02/2021   Hyperlipemia    Past Surgical History:  Procedure Laterality Date   BREAST SURGERY     CATARACT EXTRACTION W/PHACO Left 08/11/2019   Procedure: CATARACT EXTRACTION PHACO AND INTRAOCULAR LENS PLACEMENT (IOC) (CDE: 9.68);  Surgeon: Harrie Agent, MD;  Location: [facility];  Service: Ophthalmology;  Laterality: Left;   CATARACT EXTRACTION W/PHACO Right 09/29/2019   Procedure: CATARACT EXTRACTION PHACO AND INTRAOCULAR LENS PLACEMENT RIGHT EYE;  Surgeon: Harrie Agent, MD;  Location: [facility];  Service: Ophthalmology;  Laterality: Right;  CDE: 10.60   DILITATION & CURRETTAGE/HYSTROSCOPY WITH ESSURE     Patient Active Problem List   Diagnosis Date Noted   Comminuted fracture of right humerus 03/29/2023   Preventative health care 03/10/2022   Hypercalcemia 09/08/2021   High iron content of liver determined by magnetic resonance imaging 08/02/2021   Hyperkalemia 07/21/2021   Elevated ferritin level 06/24/2021   Fatigue 05/20/2021   Iron deficiency 01/18/2021   Elevated BP without diagnosis of hypertension 01/18/2021   Overweight with body mass index (BMI) of 26 to 26.9 in adult 03/18/2020   Refused influenza vaccine 03/18/2020   Vertigo 12/28/2017   Hyperlipemia  12/28/2017   PCP: Dr. Manus Fireman REFERRING PROVIDER: Dr. Fonda Olmsted  ONSET DATE: 02/15/23  REFERRING DIAG: rt prox humerus fx with regional pain syndrome of right upper etremity   THERAPY DIAG:  Acute pain of right shoulder  Stiffness of right shoulder, not elsewhere classified  Stiffness of right hand, not elsewhere classified  Stiffness of right elbow, not elsewhere classified  Other symptoms and signs involving the musculoskeletal system  Rationale for Evaluation and Treatment: Rehabilitation  SUBJECTIVE:   SUBJECTIVE STATEMENT: S: It feels like it normally does.   PERTINENT HISTORY: Pt is a 78 y/o female presenting s/p right proximal humerus fracture after falling when [place] up in the woods on 02/15/23. Pt attended therapy at [facility] approximately 4 times before transferring to this clinic.   PRECAUTIONS: Shoulder  WEIGHT BEARING RESTRICTIONS: Yes NWB  PAIN:  Are you having pain? No  FALLS: Has patient fallen in last 6 months? Yes. Number of falls 1  PLOF: Independent  PATIENT GOALS: To get the right arm moving again.   NEXT MD VISIT: 6 weeks from 06/11/23  OBJECTIVE:  Note: Objective measures were completed at Evaluation unless otherwise noted.  HAND DOMINANCE: Right  ADLs: Overall ADLs: Pt is unable to use the RUE for ADLs. She can hold paper plate between her fingers. She is unable to make a fist or use her hand and arm. Pt is using the LUE for all tasks, using hemi-dressing techniques and  strategies for independence.    FUNCTIONAL OUTCOME MEASURES: Quick Dash: 72.73 06/08/23: 18.18  UPPER EXTREMITY ROM:       Assessed in sitting, er/IR adducted  Passive ROM Right eval Right 05/07/23 Right 06/08/23  Shoulder flexion 61 84 88  Shoulder abduction 54 66 77  Shoulder internal rotation 90 90 90  Shoulder external rotation 19 28 28   Elbow flexion 136 145 145  Elbow extension -30 -5 -5  Wrist flexion 55 70 75  Wrist extension 8 22 48   Wrist ulnar deviation 25    Wrist radial deviation 8 11 15   Wrist pronation 85 90   Wrist supination 85 90   (Blank rows = not tested)   Assessed in sitting, er/IR adducted  Active ROM Right eval Right 05/07/23 Right 06/08/23  Shoulder flexion 33 68 81  Shoulder abduction 41 56 71  Shoulder internal rotation 90 90 90  Shoulder external rotation 20 34 35  Elbow flexion 128 142 146  Elbow extension -45 -10 -10  Wrist flexion 55 60 62  Wrist extension 8 12 30   Wrist ulnar deviation 25    Wrist radial deviation 4 5 8   Wrist pronation 70 90 90  Wrist supination 80 88 88  (Blank rows = not tested)      Active ROM Right 05/07/23 Right 06/08/23  Thumb MCP (0-60) 34 36  Thumb IP (0-80) 12 26  Index MCP (0-90) 50 50  Index PIP (0-100) 52 64  Index DIP (0-70)  12 20  Long MCP (0-90)  42 48  Long PIP (0-100)  40 52  Long DIP (0-70)  0 0  Ring MCP (0-90)  30 44  Ring PIP (0-100)  34 54  Ring DIP (0-70)  0 0  Little MCP (0-90)  32 32  Little PIP (0-100)  36 52  Little DIP (0-70)  0 0  (Blank rows = not tested)  UPPER EXTREMITY MMT:       Unable to assess due to pain, limited active movement   12/2: shoulder evaluated on observation; MMT for elbow and wrist  MMT Right eval Right 06/08/23  Shoulder flexion 3-/5 3-/5  Shoulder abduction 3-/5 3-/5  Shoulder internal rotation 3/5 3/5  Shoulder external rotation 3/5 3/5  Elbow flexion 4/5 4+/5  Elbow extension 4-/5 4-/5  Wrist flexion 3+/5 4/5  Wrist extension 3+/5 4-/5  Wrist ulnar deviation 4/5 4/5  Wrist radial deviation 3+/5 4/5  Wrist pronation 4-/5 4+/5  Wrist supination 4-/5 4+/5  (Blank rows = not tested)  HAND FUNCTION: Unable, TBD   COORDINATION: Unable, TBD  SENSATION: WFL  EDEMA:  -Right  -Left Upper arm    32cm 30.5cm Elbow           29cm 24cm Mid-forearm 20cm 20.5cm Wrist            17cm 14.5cm Palm            18.5cm 17cm  05/07/23 -Right   Upper arm    31cm  Elbow            25.5cm Mid-forearm 20cm  Wrist            15.5cm  Palm            17.5cm   06/08/23 -Right   Upper arm    30.5cm  Elbow           26.5cm Mid-forearm  20.75cm  Wrist  15.5cm  Palm            17.25cm  OBSERVATIONS: pt with generalized edema throughout RUE; joint stiffness in digits, wrist, elbow, and shoulder. Consistent with possibility of developing CRPS noting dx of regional pain syndrome of RUE   TODAY'S TREATMENT:                                                                                                                              DATE:  06/12/23 -Paraffin: right hand, 10'  -Manual: retrograde massage and myofascial release to right hand and digits, anterior shoulder, trapezius, and scapular regions to decrease stiffness and increase joint ROM in RUE -P/ROM: passive stretching to right hand and digits, 5 reps -A/ROM: composite digit flexion/extension, 5 reps -AA/ROM: supine; flexion, abduction, protraction, horizontal abduction, er/IR, 10 reps -Functional reaching: pt placing 5 cones on bottom shelf of overhead cabinet in flexion, removing in abduction -Sponges: 10, 9, 11 -Theraputty: yellow-flatten in standing  06/08/23 -Manual: retrograde massage and myofascial release to right hand and digits to decrease stiffness and increase joint ROM in RUE -P/ROM: passive stretching to right hand and digits, 5 reps -AA/ROM: seated-flexion, abduction, protraction, 10 reps -Functional reaching: pt placing 5 cones on bottom shelf of overhead cabinet in flexion, removing in abduction -Cone pass behind back for IR and behind head for er, 10 reps  06/04/23 -Manual: retrograde massage and myofascial release to right hand and digits to decrease stiffness and increase joint ROM in RUE -P/ROM: passive stretching to right hand and digits, 5 reps -Joint Blocking: all digits PIP and DIP joints-5 reps -P/ROM: wrist flexion, extension, ulnar/radial deviation, supination/pronation,  x10 -AA/ROM: supine; flexion, abduction, protraction, horizontal abduction, er/IR, x10 -AA/ROM: seated-flexion, abduction, protraction, horizontal abduction, er/IR, x10 -Wall Climbs: flexion, x10   PATIENT EDUCATION: Education details: grasping and manipulating items such as cotton balls Person educated: Patient Education method: Explanation, Demonstration, Tactile cues, and Verbal cues Education comprehension: verbalized understanding and returned demonstration  HOME EXERCISE PROGRAM: Eval: Provided shoulder length edema glove/sleeve, educated on donning/doffing, wear and care; continue table slides from [facility] therapist 11/8: AA/ROM in supine 11/15: wrist stretch into extension  11/27: flexion glove wear 12/9: Wall Climbs 12/11: Towel Roll gripping and wringing out 12/20: A/ROM 12/30: shoulder stretch at wall  06/12/23: grasping and manipulating items such as cotton balls  GOALS: Goals reviewed with patient? Yes  SHORT TERM GOALS: Target date: 05/09/23  Pt will be provided with and educated on HEP to improve RUE functioning required for use during ADLs.   Goal status: IN PROGRESS  2.  Pt will increase P/ROM of right shoulder by 50+ degrees to improve ability to perform dressing and bathing tasks independently.   Goal status: IN PROGRESS  3.  Pt will increase A/ROM of right elbow to full extension to improve ability to perform low level functional reaching tasks.   Goal status: IN PROGRESS  4.  Pt will decrease edema in RUE  by 50% or greater to improve mobility in RUE required for active participation in bathing and grooming tasks.   Goal status: MET  5.  Pt will demonstrate ability to make at least 50% of a grasp to promote use when grasping large items such as a cup or water bottle.   Goal status: IN PROGRESS   LONG TERM GOALS: Target date: 06/09/23  Pt will decrease pain in the RUE to 2/10 or less to improve ability to sleep for 3+ consecutive hours without  waking due to pain.   Goal status: IN PROGRESS  2.  Pt will increase RUE A/ROM to at least 120 degrees shoulder flexion/abduction, and wrist extension by 25+ degrees to improve use of RUE during meal preparation tasks.   Goal status: IN PROGRESS  3.  Pt will increase RUE strength to 4-/5 or greater to improve ability to use RUE during housework tasks.   Goal status: IN PROGRESS  4.  Pt will increase grip strength to at least 20# and pinch strength to at least 5# to improve ability to grasp and carry groceries.  Baseline:  Goal status: IN PROGRESS  5.  Pt will decrease RUE fascial restrictions to min amounts or less to improve ability to perform functional reaching tasks.   Goal status: IN PROGRESS   ASSESSMENT:   CLINICAL IMPRESSION: Began session with paraffin to right hand, completed manual techniques to shoulder while hand wrapped in paraffin. Completed passive stretching to digits once paraffin completed, improvement in tolerance to stretching MCPs after paraffin. Continued with AA/ROM in supine and functional reaching. Added hand work with sponges and theraputty. Discussed manipulating objects at home, continuing with reaching into cabinets. Verbal cuing for form and technique during tasks.   PERFORMANCE DEFICITS: in functional skills including ADLs, IADLs, coordination, dexterity, proprioception, edema, ROM, strength, pain, fascial restrictions, Fine motor control, Gross motor control, and UE functional use    PLAN:  OT FREQUENCY: 2x/week  OT DURATION: 8 weeks  PLANNED INTERVENTIONS: 97168 OT Re-evaluation, 97535 self care/ADL training, 02889 therapeutic exercise, 97530 therapeutic activity, 97140 manual therapy, 97035 ultrasound, 97018 paraffin, 02989 moist heat, 97010 cryotherapy, 97032 electrical stimulation (manual), 97014 electrical stimulation unattended, 97760 Splinting (initial encounter), and DME and/or AE instructions  CONSULTED AND AGREED WITH PLAN OF CARE:  Patient  PLAN FOR NEXT SESSION: retrograde massage and manual techniques, passive stretching progressing to AA/ROM and A/ROM as able to tolerate. Follow up on HEP; adjust flexion glove as able. Begin session with paraffin to hand    Sonny Cory, OTR/L  [phone] 06/12/2023, 1:43 PM

## 2023-06-14 ENCOUNTER — Encounter (HOSPITAL_COMMUNITY): Payer: Self-pay | Admitting: Occupational Therapy

## 2023-06-14 ENCOUNTER — Ambulatory Visit (HOSPITAL_COMMUNITY): Payer: No Typology Code available for payment source | Admitting: Occupational Therapy

## 2023-06-14 DIAGNOSIS — M25511 Pain in right shoulder: Secondary | ICD-10-CM

## 2023-06-14 DIAGNOSIS — M25611 Stiffness of right shoulder, not elsewhere classified: Secondary | ICD-10-CM

## 2023-06-14 DIAGNOSIS — M25621 Stiffness of right elbow, not elsewhere classified: Secondary | ICD-10-CM

## 2023-06-14 DIAGNOSIS — M25641 Stiffness of right hand, not elsewhere classified: Secondary | ICD-10-CM

## 2023-06-14 DIAGNOSIS — R29898 Other symptoms and signs involving the musculoskeletal system: Secondary | ICD-10-CM

## 2023-06-14 NOTE — Therapy (Signed)
 OUTPATIENT OCCUPATIONAL THERAPY ORTHO TREATMENT   Patient Name: Stephanie Ramos MRN: [id number] DOB:[birth year], 78 y.o., female Today's Date: 06/14/2023    END OF SESSION:  OT End of Session - 06/14/23 1344     Visit Number 23    Number of Visits 37    Date for OT Re-Evaluation 08/07/23    Authorization Type [employer], copay: $15    Authorization Time Period no auth required    OT Start Time 1259    OT Stop Time 1340    OT Time Calculation (min) 41 min    Activity Tolerance Patient tolerated treatment well    Behavior During Therapy WFL for tasks assessed/performed                 Past Medical History:  Diagnosis Date   High iron content of liver determined by magnetic resonance imaging 08/02/2021   Hyperlipemia    Past Surgical History:  Procedure Laterality Date   BREAST SURGERY     CATARACT EXTRACTION W/PHACO Left 08/11/2019   Procedure: CATARACT EXTRACTION PHACO AND INTRAOCULAR LENS PLACEMENT (IOC) (CDE: 9.68);  Surgeon: Harrie Agent, MD;  Location: [facility];  Service: Ophthalmology;  Laterality: Left;   CATARACT EXTRACTION W/PHACO Right 09/29/2019   Procedure: CATARACT EXTRACTION PHACO AND INTRAOCULAR LENS PLACEMENT RIGHT EYE;  Surgeon: Harrie Agent, MD;  Location: [facility];  Service: Ophthalmology;  Laterality: Right;  CDE: 10.60   DILITATION & CURRETTAGE/HYSTROSCOPY WITH ESSURE     Patient Active Problem List   Diagnosis Date Noted   Comminuted fracture of right humerus 03/29/2023   Preventative health care 03/10/2022   Hypercalcemia 09/08/2021   High iron content of liver determined by magnetic resonance imaging 08/02/2021   Hyperkalemia 07/21/2021   Elevated ferritin level 06/24/2021   Fatigue 05/20/2021   Iron deficiency 01/18/2021   Elevated BP without diagnosis of hypertension 01/18/2021   Overweight with body mass index (BMI) of 26 to 26.9 in adult 03/18/2020   Refused influenza vaccine 03/18/2020   Vertigo 12/28/2017   Hyperlipemia  12/28/2017   PCP: Dr. Manus Fireman REFERRING PROVIDER: Dr. Fonda Olmsted  ONSET DATE: 02/15/23  REFERRING DIAG: rt prox humerus fx with regional pain syndrome of right upper etremity   THERAPY DIAG:  Acute pain of right shoulder  Stiffness of right shoulder, not elsewhere classified  Stiffness of right hand, not elsewhere classified  Stiffness of right elbow, not elsewhere classified  Other symptoms and signs involving the musculoskeletal system  Rationale for Evaluation and Treatment: Rehabilitation  SUBJECTIVE:   SUBJECTIVE STATEMENT: S: I have a dent in this one now. (Index volar PIP)  PERTINENT HISTORY: Pt is a 78 y/o female presenting s/p right proximal humerus fracture after falling when [place] in the woods on 02/15/23. Pt attended therapy at [facility] approximately 4 times before transferring to this clinic.   PRECAUTIONS: Shoulder  WEIGHT BEARING RESTRICTIONS: Yes NWB  PAIN:  Are you having pain? No  FALLS: Has patient fallen in last 6 months? Yes. Number of falls 1  PLOF: Independent  PATIENT GOALS: To get the right arm moving again.   NEXT MD VISIT: 6 weeks from 06/11/23  OBJECTIVE:  Note: Objective measures were completed at Evaluation unless otherwise noted.  HAND DOMINANCE: Right  ADLs: Overall ADLs: Pt is unable to use the RUE for ADLs. She can hold paper plate between her fingers. She is unable to make a fist or use her hand and arm. Pt is using the LUE for all  tasks, using hemi-dressing techniques and strategies for independence.    FUNCTIONAL OUTCOME MEASURES: Quick Dash: 72.73 06/08/23: 18.18  UPPER EXTREMITY ROM:       Assessed in sitting, er/IR adducted  Passive ROM Right eval Right 05/07/23 Right 06/08/23  Shoulder flexion 61 84 88  Shoulder abduction 54 66 77  Shoulder internal rotation 90 90 90  Shoulder external rotation 19 28 28   Elbow flexion 136 145 145  Elbow extension -30 -5 -5  Wrist flexion 55 70 75  Wrist  extension 8 22 48  Wrist ulnar deviation 25    Wrist radial deviation 8 11 15   Wrist pronation 85 90   Wrist supination 85 90   (Blank rows = not tested)   Assessed in sitting, er/IR adducted  Active ROM Right eval Right 05/07/23 Right 06/08/23  Shoulder flexion 33 68 81  Shoulder abduction 41 56 71  Shoulder internal rotation 90 90 90  Shoulder external rotation 20 34 35  Elbow flexion 128 142 146  Elbow extension -45 -10 -10  Wrist flexion 55 60 62  Wrist extension 8 12 30   Wrist ulnar deviation 25    Wrist radial deviation 4 5 8   Wrist pronation 70 90 90  Wrist supination 80 88 88  (Blank rows = not tested)      Active ROM Right 05/07/23 Right 06/08/23  Thumb MCP (0-60) 34 36  Thumb IP (0-80) 12 26  Index MCP (0-90) 50 50  Index PIP (0-100) 52 64  Index DIP (0-70)  12 20  Long MCP (0-90)  42 48  Long PIP (0-100)  40 52  Long DIP (0-70)  0 0  Ring MCP (0-90)  30 44  Ring PIP (0-100)  34 54  Ring DIP (0-70)  0 0  Little MCP (0-90)  32 32  Little PIP (0-100)  36 52  Little DIP (0-70)  0 0  (Blank rows = not tested)  UPPER EXTREMITY MMT:       Unable to assess due to pain, limited active movement   12/2: shoulder evaluated on observation; MMT for elbow and wrist  MMT Right eval Right 06/08/23  Shoulder flexion 3-/5 3-/5  Shoulder abduction 3-/5 3-/5  Shoulder internal rotation 3/5 3/5  Shoulder external rotation 3/5 3/5  Elbow flexion 4/5 4+/5  Elbow extension 4-/5 4-/5  Wrist flexion 3+/5 4/5  Wrist extension 3+/5 4-/5  Wrist ulnar deviation 4/5 4/5  Wrist radial deviation 3+/5 4/5  Wrist pronation 4-/5 4+/5  Wrist supination 4-/5 4+/5  (Blank rows = not tested)  HAND FUNCTION: Unable, TBD   COORDINATION: Unable, TBD  SENSATION: WFL  EDEMA:  -Right  -Left Upper arm    32cm 30.5cm Elbow           29cm 24cm Mid-forearm 20cm 20.5cm Wrist            17cm 14.5cm Palm            18.5cm 17cm  05/07/23 -Right   Upper arm    31cm  Elbow            25.5cm Mid-forearm 20cm  Wrist            15.5cm  Palm            17.5cm   06/08/23 -Right   Upper arm    30.5cm  Elbow           26.5cm Mid-forearm  20.75cm  Wrist  15.5cm  Palm            17.25cm  OBSERVATIONS: pt with generalized edema throughout RUE; joint stiffness in digits, wrist, elbow, and shoulder. Consistent with possibility of developing CRPS noting dx of regional pain syndrome of RUE   TODAY'S TREATMENT:                                                                                                                              DATE:  06/14/23 -Paraffin: right hand, 10'  -Manual: retrograde massage and myofascial release to right hand and digits, anterior shoulder, trapezius, and scapular regions to decrease stiffness and increase joint ROM in RUE -P/ROM: passive stretching to right hand and digits, 5 reps -A/ROM: supine; flexion, abduction, protraction, horizontal abduction, er/IR, 10 reps -A/ROM: standing-flexion, abduction, protraction, horizontal abduction, er/IR, 10 reps -Proximal shoulder strengthening: supine-paddles, criss cross, circles each direction, 10 reps each   06/12/23 -Paraffin: right hand, 10'  -Manual: retrograde massage and myofascial release to right hand and digits, anterior shoulder, trapezius, and scapular regions to decrease stiffness and increase joint ROM in RUE -P/ROM: passive stretching to right hand and digits, 5 reps -A/ROM: composite digit flexion/extension, 5 reps -AA/ROM: supine; flexion, abduction, protraction, horizontal abduction, er/IR, 10 reps -Functional reaching: pt placing 5 cones on bottom shelf of overhead cabinet in flexion, removing in abduction -Sponges: 10, 9, 11 -Theraputty: yellow-flatten in standing  06/08/23 -Manual: retrograde massage and myofascial release to right hand and digits to decrease stiffness and increase joint ROM in RUE -P/ROM: passive stretching to right hand and digits, 5 reps -AA/ROM:  seated-flexion, abduction, protraction, 10 reps -Functional reaching: pt placing 5 cones on bottom shelf of overhead cabinet in flexion, removing in abduction -Cone pass behind back for IR and behind head for er, 10 reps    PATIENT EDUCATION: Education details: grasping and manipulating items such as cotton balls Person educated: Patient Education method: Programmer, Multimedia, Demonstration, Tactile cues, and Verbal cues Education comprehension: verbalized understanding and returned demonstration  HOME EXERCISE PROGRAM: Eval: Provided shoulder length edema glove/sleeve, educated on donning/doffing, wear and care; continue table slides from [facility] therapist 11/8: AA/ROM in supine 11/15: wrist stretch into extension  11/27: flexion glove wear 12/9: Wall Climbs 12/11: Towel Roll gripping and wringing out 12/20: A/ROM 12/30: shoulder stretch at wall  06/12/23: grasping and manipulating items such as cotton balls  GOALS: Goals reviewed with patient? Yes  SHORT TERM GOALS: Target date: 05/09/23  Pt will be provided with and educated on HEP to improve RUE functioning required for use during ADLs.   Goal status: IN PROGRESS  2.  Pt will increase P/ROM of right shoulder by 50+ degrees to improve ability to perform dressing and bathing tasks independently.   Goal status: IN PROGRESS  3.  Pt will increase A/ROM of right elbow to full extension to improve ability to perform low level functional reaching tasks.   Goal status: IN PROGRESS  4.  Pt will decrease edema in RUE by 50% or greater to improve mobility in RUE required for active participation in bathing and grooming tasks.   Goal status: MET  5.  Pt will demonstrate ability to make at least 50% of a grasp to promote use when grasping large items such as a cup or water bottle.   Goal status: IN PROGRESS   LONG TERM GOALS: Target date: 06/09/23  Pt will decrease pain in the RUE to 2/10 or less to improve ability to sleep for 3+  consecutive hours without waking due to pain.   Goal status: IN PROGRESS  2.  Pt will increase RUE A/ROM to at least 120 degrees shoulder flexion/abduction, and wrist extension by 25+ degrees to improve use of RUE during meal preparation tasks.   Goal status: IN PROGRESS  3.  Pt will increase RUE strength to 4-/5 or greater to improve ability to use RUE during housework tasks.   Goal status: IN PROGRESS  4.  Pt will increase grip strength to at least 20# and pinch strength to at least 5# to improve ability to grasp and carry groceries.  Baseline:  Goal status: IN PROGRESS  5.  Pt will decrease RUE fascial restrictions to min amounts or less to improve ability to perform functional reaching tasks.   Goal status: IN PROGRESS   ASSESSMENT:   CLINICAL IMPRESSION: Began session with paraffin to right hand, completed manual techniques to shoulder while hand wrapped in paraffin. Completed passive stretching to digits once paraffin completed, improvement in tolerance to stretching MCPs after paraffin. Progressed to A/ROM in supine and sitting, focusing on strengthening within available ROM of the shoulder. ROM continues to be 40-50% against gravity. Added proximal shoulder strengthening today as well. Pt dizzy upon sitting up, rest break provided to allow dizziness to resolve. Verbal and visual cuing for form and technique.   PERFORMANCE DEFICITS: in functional skills including ADLs, IADLs, coordination, dexterity, proprioception, edema, ROM, strength, pain, fascial restrictions, Fine motor control, Gross motor control, and UE functional use    PLAN:  OT FREQUENCY: 2x/week  OT DURATION: 8 weeks  PLANNED INTERVENTIONS: 97168 OT Re-evaluation, 97535 self care/ADL training, 02889 therapeutic exercise, 97530 therapeutic activity, 97140 manual therapy, 97035 ultrasound, 97018 paraffin, 02989 moist heat, 97010 cryotherapy, 97032 electrical stimulation (manual), 97014 electrical stimulation  unattended, 97760 Splinting (initial encounter), and DME and/or AE instructions  CONSULTED AND AGREED WITH PLAN OF CARE: Patient  PLAN FOR NEXT SESSION: retrograde massage and manual techniques, A/ROM, functional reaching. Follow up on HEP; adjust flexion glove as able. Begin session with paraffin to hand    Sonny Cory, OTR/L  [phone] 06/14/2023, 1:44 PM

## 2023-06-16 IMAGING — US US ABDOMEN LIMITED
1 series · 14 of 25 positions shown · non-contrast
Comparison: None.

CLINICAL DATA: Elevated ferritin.

EXAM:
ULTRASOUND ABDOMEN LIMITED RIGHT UPPER QUADRANT

[Series 1: us abdomen limited ruq (liver/gb) · 14 of 115 slices shown]
[im 1/115]
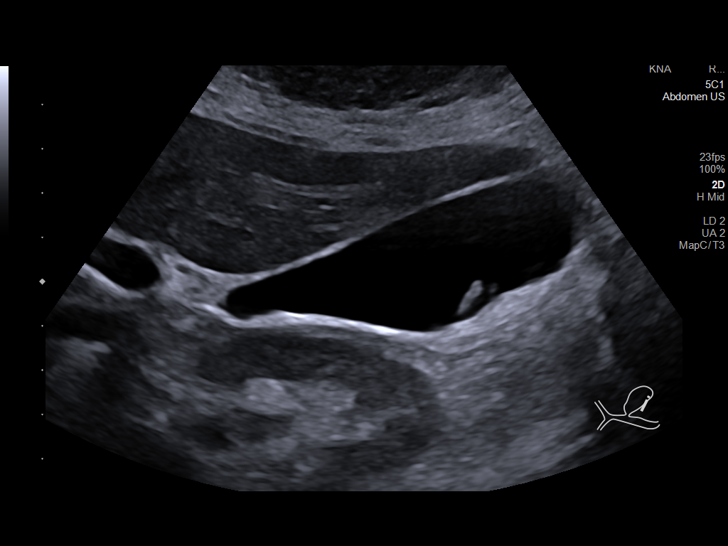
[im 10/115]
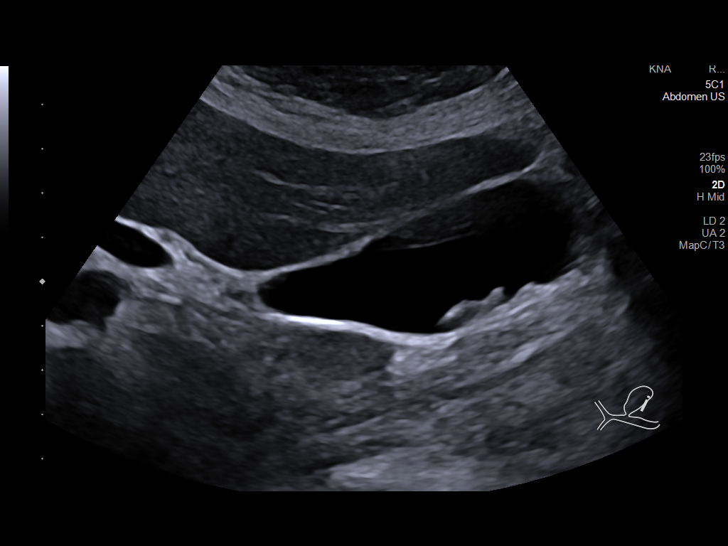
[im 20/115]
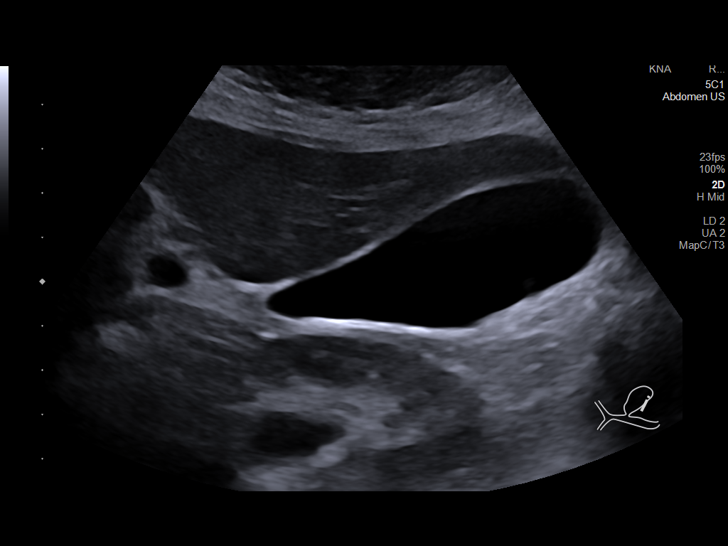
[im 29/115]
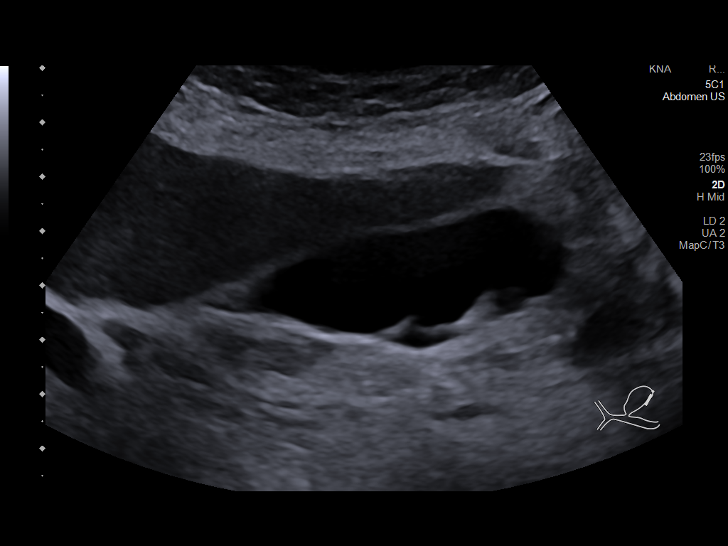
[im 39/115]
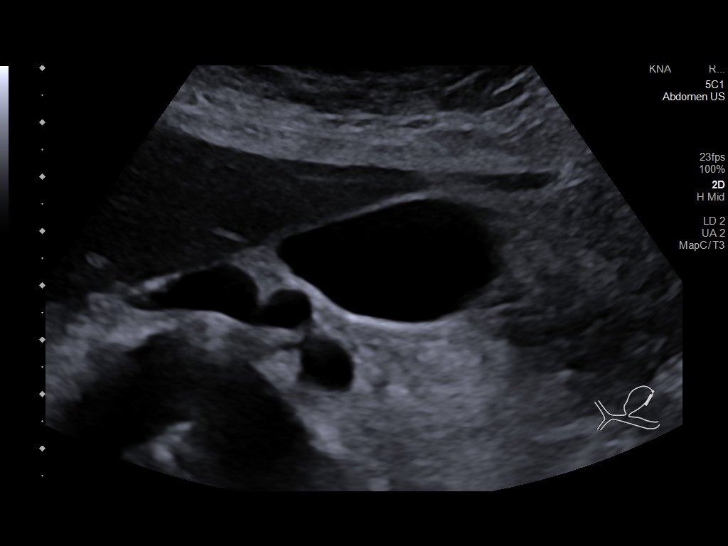
[im 43/115]
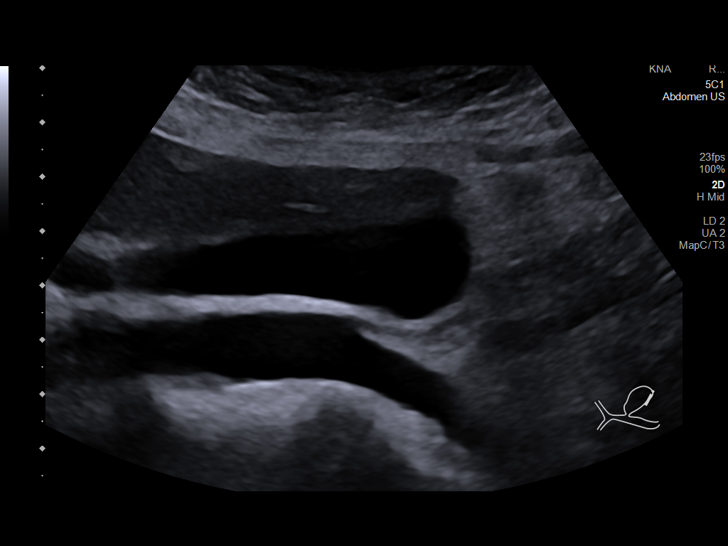
[im 53/115]
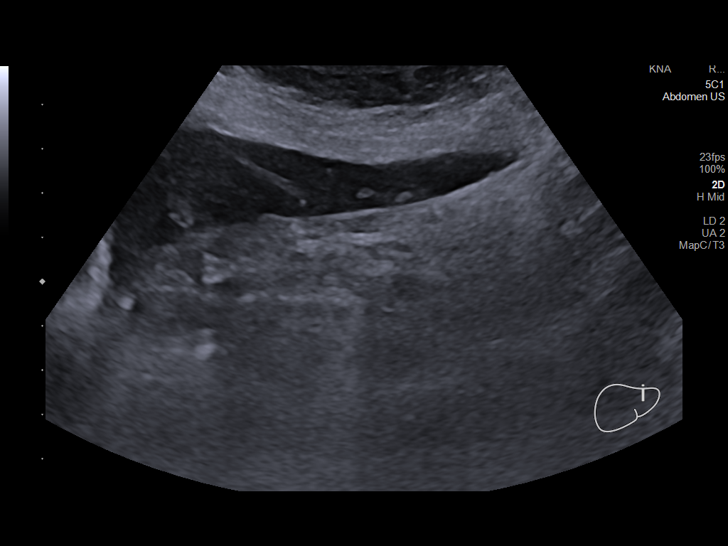
[im 62/115]
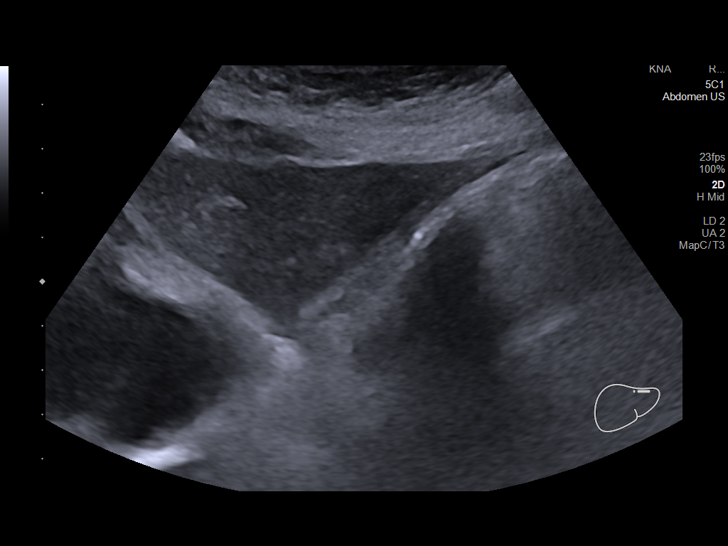
[im 72/115]
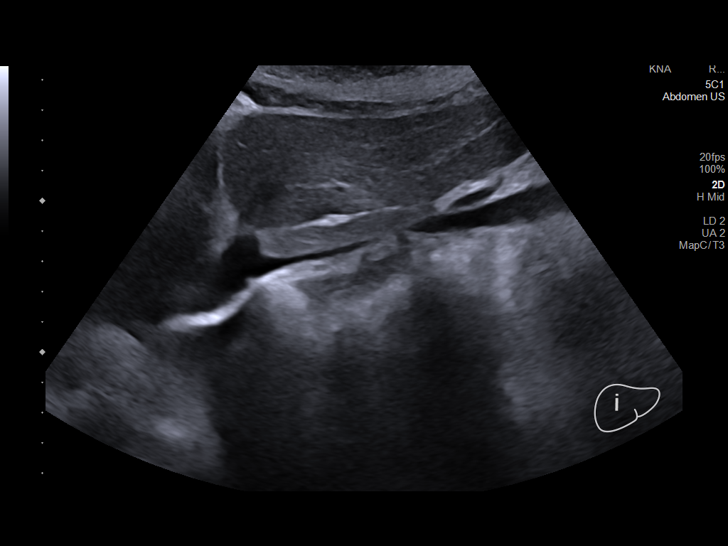
[im 77/115]
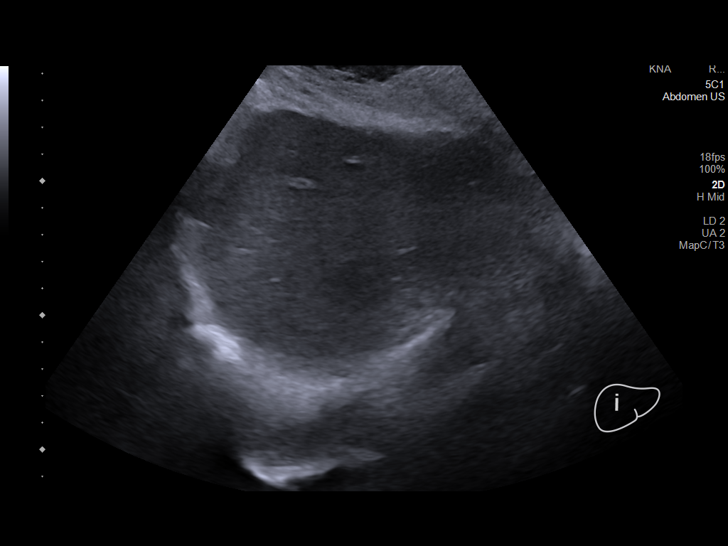
[im 86/115]
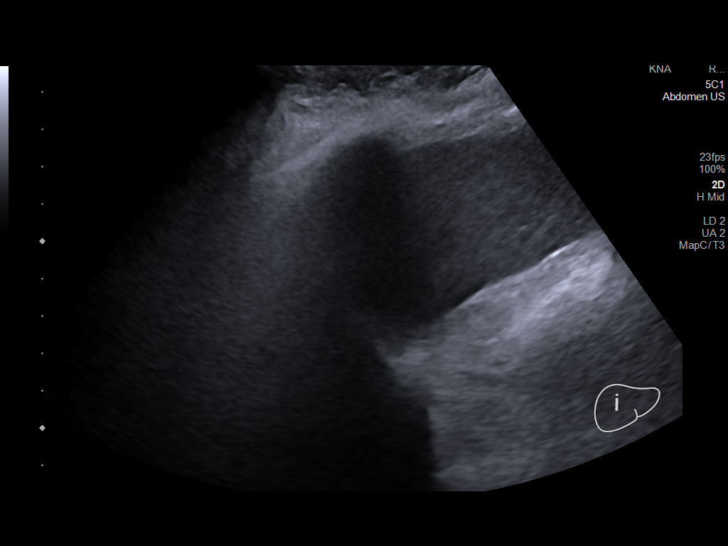
[im 96/115]
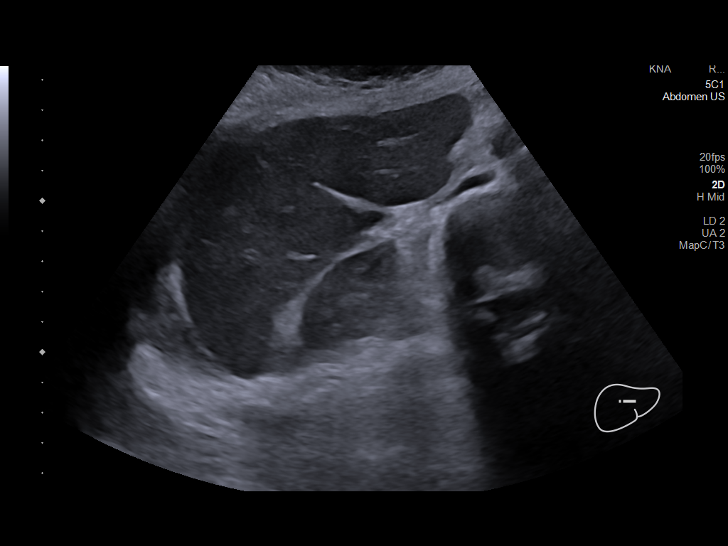
[im 105/115]
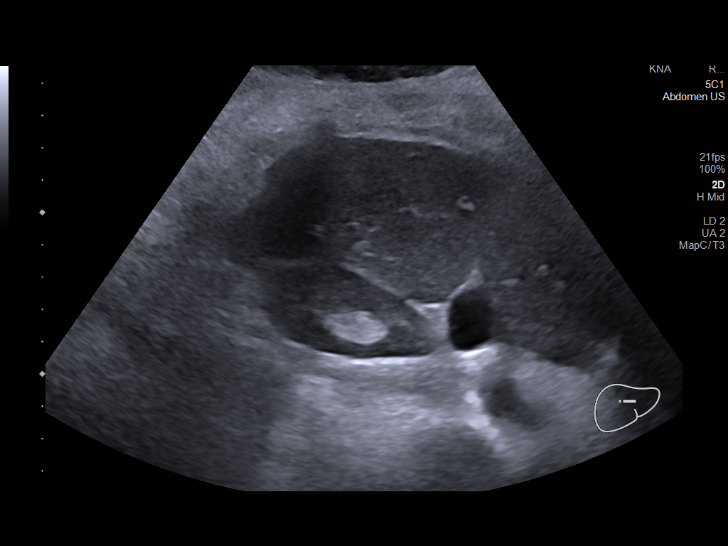
[im 115/115]
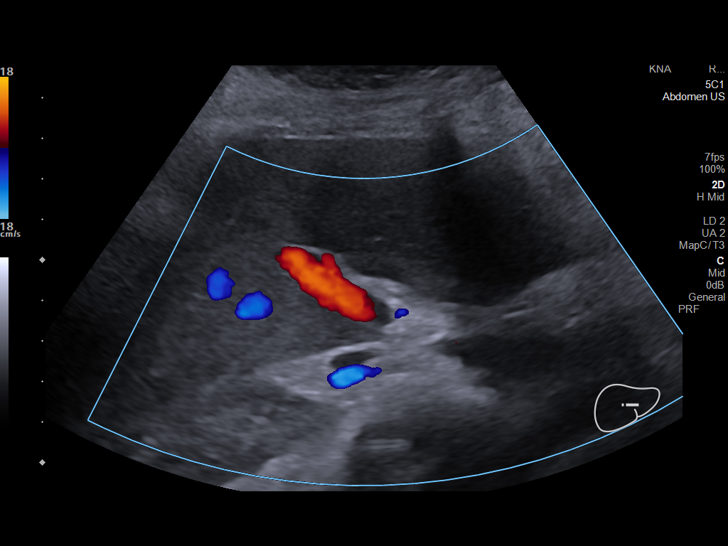

[14 of 25 positions shown; findings below may reference images not displayed]

FINDINGS: Gallbladder:

Mobile gallstones are present measuring up to 1.8 cm. There is no
gallbladder wall thickening. No sonographic Murphy sign noted by
sonographer.

Common bile duct:

Diameter: 3.2 mm.

Liver:

No focal lesion identified. Within normal limits in parenchymal
echogenicity. Portal vein is patent on color Doppler imaging with
normal direction of blood flow towards the liver.

Other: None.
IMPRESSION: 1. Cholelithiasis. No additional sonographic evidence for acute
cholecystitis.

## 2023-06-18 ENCOUNTER — Encounter (HOSPITAL_COMMUNITY): Payer: Self-pay | Admitting: Occupational Therapy

## 2023-06-18 ENCOUNTER — Ambulatory Visit (HOSPITAL_COMMUNITY): Payer: No Typology Code available for payment source | Admitting: Occupational Therapy

## 2023-06-18 DIAGNOSIS — M25621 Stiffness of right elbow, not elsewhere classified: Secondary | ICD-10-CM

## 2023-06-18 DIAGNOSIS — M25511 Pain in right shoulder: Secondary | ICD-10-CM

## 2023-06-18 DIAGNOSIS — M25641 Stiffness of right hand, not elsewhere classified: Secondary | ICD-10-CM

## 2023-06-18 DIAGNOSIS — M25611 Stiffness of right shoulder, not elsewhere classified: Secondary | ICD-10-CM

## 2023-06-18 DIAGNOSIS — R29898 Other symptoms and signs involving the musculoskeletal system: Secondary | ICD-10-CM

## 2023-06-18 NOTE — Therapy (Signed)
 OUTPATIENT OCCUPATIONAL THERAPY ORTHO TREATMENT   Patient Name: LESLE FARON MRN: [id number] DOB:[birth year], 78 y.o., female Today's Date: 06/18/2023    END OF SESSION:  OT End of Session - 06/18/23 1352     Visit Number 24    Number of Visits 37    Date for OT Re-Evaluation 08/07/23    Authorization Type [employer], copay: $15    Authorization Time Period no auth required    OT Start Time 1303    OT Stop Time 1343    OT Time Calculation (min) 40 min    Activity Tolerance Patient tolerated treatment well    Behavior During Therapy [facility] for tasks assessed/performed                  Past Medical History:  Diagnosis Date   High iron content of liver determined by magnetic resonance imaging 08/02/2021   Hyperlipemia    Past Surgical History:  Procedure Laterality Date   BREAST SURGERY     CATARACT EXTRACTION W/PHACO Left 08/11/2019   Procedure: CATARACT EXTRACTION PHACO AND INTRAOCULAR LENS PLACEMENT (IOC) (CDE: 9.68);  Surgeon: Harrie Agent, MD;  Location: [facility];  Service: Ophthalmology;  Laterality: Left;   CATARACT EXTRACTION W/PHACO Right 09/29/2019   Procedure: CATARACT EXTRACTION PHACO AND INTRAOCULAR LENS PLACEMENT RIGHT EYE;  Surgeon: Harrie Agent, MD;  Location: [facility];  Service: Ophthalmology;  Laterality: Right;  CDE: 10.60   DILITATION & CURRETTAGE/HYSTROSCOPY WITH ESSURE     Patient Active Problem List   Diagnosis Date Noted   Comminuted fracture of right humerus 03/29/2023   Preventative health care 03/10/2022   Hypercalcemia 09/08/2021   High iron content of liver determined by magnetic resonance imaging 08/02/2021   Hyperkalemia 07/21/2021   Elevated ferritin level 06/24/2021   Fatigue 05/20/2021   Iron deficiency 01/18/2021   Elevated BP without diagnosis of hypertension 01/18/2021   Overweight with body mass index (BMI) of 26 to 26.9 in adult 03/18/2020   Refused influenza vaccine 03/18/2020   Vertigo 12/28/2017   Hyperlipemia  12/28/2017   PCP: Dr. Manus Fireman REFERRING PROVIDER: Dr. Fonda Olmsted  ONSET DATE: 02/15/23  REFERRING DIAG: rt prox humerus fx with regional pain syndrome of right upper etremity   THERAPY DIAG:  Acute pain of right shoulder  Stiffness of right shoulder, not elsewhere classified  Stiffness of right hand, not elsewhere classified  Stiffness of right elbow, not elsewhere classified  Other symptoms and signs involving the musculoskeletal system  Rationale for Evaluation and Treatment: Rehabilitation  SUBJECTIVE:   SUBJECTIVE STATEMENT: S: It's about the same.   PERTINENT HISTORY: Pt is a 79 y/o female presenting s/p right proximal humerus fracture after falling when [place] in the woods on 02/15/23. Pt attended therapy at [facility] approximately 4 times before transferring to this clinic.   PRECAUTIONS: Shoulder  WEIGHT BEARING RESTRICTIONS: Yes NWB  PAIN:  Are you having pain? No  FALLS: Has patient fallen in last 6 months? Yes. Number of falls 1  PLOF: Independent  PATIENT GOALS: To get the right arm moving again.   NEXT MD VISIT: 6 weeks from 06/11/23  OBJECTIVE:  Note: Objective measures were completed at Evaluation unless otherwise noted.  HAND DOMINANCE: Right  ADLs: Overall ADLs: Pt is unable to use the RUE for ADLs. She can hold paper plate between her fingers. She is unable to make a fist or use her hand and arm. Pt is using the LUE for all tasks, using hemi-dressing techniques and  strategies for independence.    FUNCTIONAL OUTCOME MEASURES: Quick Dash: 72.73 06/08/23: 18.18  UPPER EXTREMITY ROM:       Assessed in sitting, er/IR adducted  Passive ROM Right eval Right 05/07/23 Right 06/08/23  Shoulder flexion 61 84 88  Shoulder abduction 54 66 77  Shoulder internal rotation 90 90 90  Shoulder external rotation 19 28 28   Elbow flexion 136 145 145  Elbow extension -30 -5 -5  Wrist flexion 55 70 75  Wrist extension 8 22 48  Wrist ulnar  deviation 25    Wrist radial deviation 8 11 15   Wrist pronation 85 90   Wrist supination 85 90   (Blank rows = not tested)   Assessed in sitting, er/IR adducted  Active ROM Right eval Right 05/07/23 Right 06/08/23  Shoulder flexion 33 68 81  Shoulder abduction 41 56 71  Shoulder internal rotation 90 90 90  Shoulder external rotation 20 34 35  Elbow flexion 128 142 146  Elbow extension -45 -10 -10  Wrist flexion 55 60 62  Wrist extension 8 12 30   Wrist ulnar deviation 25    Wrist radial deviation 4 5 8   Wrist pronation 70 90 90  Wrist supination 80 88 88  (Blank rows = not tested)      Active ROM Right 05/07/23 Right 06/08/23  Thumb MCP (0-60) 34 36  Thumb IP (0-80) 12 26  Index MCP (0-90) 50 50  Index PIP (0-100) 52 64  Index DIP (0-70)  12 20  Long MCP (0-90)  42 48  Long PIP (0-100)  40 52  Long DIP (0-70)  0 0  Ring MCP (0-90)  30 44  Ring PIP (0-100)  34 54  Ring DIP (0-70)  0 0  Little MCP (0-90)  32 32  Little PIP (0-100)  36 52  Little DIP (0-70)  0 0  (Blank rows = not tested)  UPPER EXTREMITY MMT:       Unable to assess due to pain, limited active movement   12/2: shoulder evaluated on observation; MMT for elbow and wrist  MMT Right eval Right 06/08/23  Shoulder flexion 3-/5 3-/5  Shoulder abduction 3-/5 3-/5  Shoulder internal rotation 3/5 3/5  Shoulder external rotation 3/5 3/5  Elbow flexion 4/5 4+/5  Elbow extension 4-/5 4-/5  Wrist flexion 3+/5 4/5  Wrist extension 3+/5 4-/5  Wrist ulnar deviation 4/5 4/5  Wrist radial deviation 3+/5 4/5  Wrist pronation 4-/5 4+/5  Wrist supination 4-/5 4+/5  (Blank rows = not tested)  HAND FUNCTION: Unable, TBD   COORDINATION: Unable, TBD  SENSATION: WFL  EDEMA:  -Right  -Left Upper arm    32cm 30.5cm Elbow           29cm 24cm Mid-forearm 20cm 20.5cm Wrist            17cm 14.5cm Palm            18.5cm 17cm  05/07/23 -Right   Upper arm    31cm  Elbow           25.5cm Mid-forearm  20cm  Wrist            15.5cm  Palm            17.5cm   06/08/23 -Right   Upper arm    30.5cm  Elbow           26.5cm Mid-forearm  20.75cm  Wrist  15.5cm  Palm            17.25cm  OBSERVATIONS: pt with generalized edema throughout RUE; joint stiffness in digits, wrist, elbow, and shoulder. Consistent with possibility of developing CRPS noting dx of regional pain syndrome of RUE   TODAY'S TREATMENT:                                                                                                                              DATE:  06/18/23 -Paraffin: right hand, 10'  -Manual: retrograde massage and myofascial release to right hand and digits, anterior shoulder, trapezius, and scapular regions to decrease stiffness and increase joint ROM in RUE -P/ROM: passive stretching to right hand and digits, 5 reps -A/ROM: supine-flexion, abduction, protraction, horizontal abduction, er/IR, 10 reps -Proximal shoulder strengthening: supine-paddles, criss cross, circles each direction, 10 reps each  -Functional reaching: pt placing 5 cones on middle shelf of overhead cabinet in flexion, then removing -Scrub and carry: 3' scrub followed by 3' carry round 1, 2' scrub followed by 2' carry round 2  06/14/23 -Paraffin: right hand, 10'  -Manual: retrograde massage and myofascial release to right hand and digits, anterior shoulder, trapezius, and scapular regions to decrease stiffness and increase joint ROM in RUE -P/ROM: passive stretching to right hand and digits, 5 reps -A/ROM: supine; flexion, abduction, protraction, horizontal abduction, er/IR, 10 reps -A/ROM: standing-flexion, abduction, protraction, horizontal abduction, er/IR, 10 reps -Proximal shoulder strengthening: supine-paddles, criss cross, circles each direction, 10 reps each   06/12/23 -Paraffin: right hand, 10'  -Manual: retrograde massage and myofascial release to right hand and digits, anterior shoulder, trapezius, and scapular  regions to decrease stiffness and increase joint ROM in RUE -P/ROM: passive stretching to right hand and digits, 5 reps -A/ROM: composite digit flexion/extension, 5 reps -AA/ROM: supine; flexion, abduction, protraction, horizontal abduction, er/IR, 10 reps -Functional reaching: pt placing 5 cones on bottom shelf of overhead cabinet in flexion, removing in abduction -Sponges: 10, 9, 11 -Theraputty: yellow-flatten in standing    PATIENT EDUCATION: Education details: reviewed HEP Person educated: Patient Education method: Programmer, Multimedia, Demonstration, Tactile cues, and Verbal cues Education comprehension: verbalized understanding and returned demonstration  HOME EXERCISE PROGRAM: Eval: Provided shoulder length edema glove/sleeve, educated on donning/doffing, wear and care; continue table slides from Murphy-Wainer therapist 11/8: AA/ROM in supine 11/15: wrist stretch into extension  11/27: flexion glove wear 12/9: Wall Climbs 12/11: Towel Roll gripping and wringing out 12/20: A/ROM 12/30: shoulder stretch at wall  06/12/23: grasping and manipulating items such as cotton balls  GOALS: Goals reviewed with patient? Yes  SHORT TERM GOALS: Target date: 05/09/23  Pt will be provided with and educated on HEP to improve RUE functioning required for use during ADLs.   Goal status: IN PROGRESS  2.  Pt will increase P/ROM of right shoulder by 50+ degrees to improve ability to perform dressing and bathing tasks independently.   Goal status: IN PROGRESS  3.  Pt will increase  A/ROM of right elbow to full extension to improve ability to perform low level functional reaching tasks.   Goal status: IN PROGRESS  4.  Pt will decrease edema in RUE by 50% or greater to improve mobility in RUE required for active participation in bathing and grooming tasks.   Goal status: MET  5.  Pt will demonstrate ability to make at least 50% of a grasp to promote use when grasping large items such as a cup or  water bottle.   Goal status: IN PROGRESS   LONG TERM GOALS: Target date: 06/09/23  Pt will decrease pain in the RUE to 2/10 or less to improve ability to sleep for 3+ consecutive hours without waking due to pain.   Goal status: IN PROGRESS  2.  Pt will increase RUE A/ROM to at least 120 degrees shoulder flexion/abduction, and wrist extension by 25+ degrees to improve use of RUE during meal preparation tasks.   Goal status: IN PROGRESS  3.  Pt will increase RUE strength to 4-/5 or greater to improve ability to use RUE during housework tasks.   Goal status: IN PROGRESS  4.  Pt will increase grip strength to at least 20# and pinch strength to at least 5# to improve ability to grasp and carry groceries.  Baseline:  Goal status: IN PROGRESS  5.  Pt will decrease RUE fascial restrictions to min amounts or less to improve ability to perform functional reaching tasks.   Goal status: IN PROGRESS   ASSESSMENT:   CLINICAL IMPRESSION: Began session with paraffin to right hand, completed manual techniques to shoulder while hand wrapped in paraffin. Completed passive stretching to digits once paraffin completed, improvement in tolerance to stretching MCPs after paraffin. Continued with A/ROM in supine and sitting, proximal shoulder strengthening. Added scrub and carry CRPS task today, will assess and provide home protocol at next session if appropriate.  Verbal and visual cuing for form and technique.   PERFORMANCE DEFICITS: in functional skills including ADLs, IADLs, coordination, dexterity, proprioception, edema, ROM, strength, pain, fascial restrictions, Fine motor control, Gross motor control, and UE functional use    PLAN:  OT FREQUENCY: 2x/week  OT DURATION: 8 weeks  PLANNED INTERVENTIONS: 97168 OT Re-evaluation, 97535 self care/ADL training, 02889 therapeutic exercise, 97530 therapeutic activity, 97140 manual therapy, 97035 ultrasound, 97018 paraffin, 02989 moist heat, 97010  cryotherapy, 97032 electrical stimulation (manual), 97014 electrical stimulation unattended, 97760 Splinting (initial encounter), and DME and/or AE instructions  CONSULTED AND AGREED WITH PLAN OF CARE: Patient  PLAN FOR NEXT SESSION: retrograde massage and manual techniques, A/ROM, functional reaching. Follow up on HEP; adjust flexion glove as able. Begin session with paraffin to hand; continue with scrub and carry and provide home protocol   Sonny Cory, OTR/L  [phone] 06/18/2023, 1:53 PM

## 2023-06-20 ENCOUNTER — Encounter (HOSPITAL_COMMUNITY): Payer: Self-pay | Admitting: Occupational Therapy

## 2023-06-20 ENCOUNTER — Ambulatory Visit (HOSPITAL_COMMUNITY): Payer: No Typology Code available for payment source | Admitting: Occupational Therapy

## 2023-06-20 DIAGNOSIS — R29898 Other symptoms and signs involving the musculoskeletal system: Secondary | ICD-10-CM

## 2023-06-20 DIAGNOSIS — M25611 Stiffness of right shoulder, not elsewhere classified: Secondary | ICD-10-CM

## 2023-06-20 DIAGNOSIS — M25511 Pain in right shoulder: Secondary | ICD-10-CM

## 2023-06-20 DIAGNOSIS — M25621 Stiffness of right elbow, not elsewhere classified: Secondary | ICD-10-CM

## 2023-06-20 DIAGNOSIS — M25641 Stiffness of right hand, not elsewhere classified: Secondary | ICD-10-CM

## 2023-06-20 NOTE — Therapy (Signed)
 OUTPATIENT OCCUPATIONAL THERAPY ORTHO TREATMENT   Patient Name: TATEUM ROBERTSON MRN: [id number] DOB:1946/05/19, 78 y.o., female Today's Date: 06/20/2023    END OF SESSION:  OT End of Session - 06/20/23 1131     Visit Number 25    Number of Visits 37    Date for OT Re-Evaluation 08/07/23    Authorization Type [employer], copay: $15    Authorization Time Period no auth required    OT Start Time 1104    OT Stop Time 1154    OT Time Calculation (min) 50 min    Activity Tolerance Patient tolerated treatment well    Behavior During Therapy WFL for tasks assessed/performed                   Past Medical History:  Diagnosis Date   High iron content of liver determined by magnetic resonance imaging 08/02/2021   Hyperlipemia    Past Surgical History:  Procedure Laterality Date   BREAST SURGERY     CATARACT EXTRACTION W/PHACO Left 08/11/2019   Procedure: CATARACT EXTRACTION PHACO AND INTRAOCULAR LENS PLACEMENT (IOC) (CDE: 9.68);  Surgeon: Tarri Farm, MD;  Location: [facility];  Service: Ophthalmology;  Laterality: Left;   CATARACT EXTRACTION W/PHACO Right 09/29/2019   Procedure: CATARACT EXTRACTION PHACO AND INTRAOCULAR LENS PLACEMENT RIGHT EYE;  Surgeon: Tarri Farm, MD;  Location: [facility];  Service: Ophthalmology;  Laterality: Right;  CDE: 10.60   DILITATION & CURRETTAGE/HYSTROSCOPY WITH ESSURE     Patient Active Problem List   Diagnosis Date Noted   Comminuted fracture of right humerus 03/29/2023   Preventative health care 03/10/2022   Hypercalcemia 09/08/2021   High iron content of liver determined by magnetic resonance imaging 08/02/2021   Hyperkalemia 07/21/2021   Elevated ferritin level 06/24/2021   Fatigue 05/20/2021   Iron deficiency 01/18/2021   Elevated BP without diagnosis of hypertension 01/18/2021   Overweight with body mass index (BMI) of 26 to 26.9 in adult 03/18/2020   Refused influenza vaccine 03/18/2020   Vertigo 12/28/2017   Hyperlipemia  12/28/2017   PCP: Dr. Saint Cranker REFERRING PROVIDER: Dr. Osa Blase  ONSET DATE: 02/15/23  REFERRING DIAG: rt prox humerus fx with regional pain syndrome of right upper etremity   THERAPY DIAG:  Acute pain of right shoulder  Stiffness of right shoulder, not elsewhere classified  Stiffness of right hand, not elsewhere classified  Stiffness of right elbow, not elsewhere classified  Other symptoms and signs involving the musculoskeletal system  Rationale for Evaluation and Treatment: Rehabilitation  SUBJECTIVE:   SUBJECTIVE STATEMENT: S: I've been carrying a weight.  PERTINENT HISTORY: Pt is a 78 y/o female presenting s/p right proximal humerus fracture after falling when [place] up in the woods on 02/15/23. Pt attended therapy at [facility] approximately 4 times before transferring to this clinic.   PRECAUTIONS: Shoulder  WEIGHT BEARING RESTRICTIONS: Yes NWB  PAIN:  Are you having pain? No  FALLS: Has patient fallen in last 6 months? Yes. Number of falls 1  PLOF: Independent  PATIENT GOALS: To get the right arm moving again.   NEXT MD VISIT: 6 weeks from 06/11/23  OBJECTIVE:  Note: Objective measures were completed at Evaluation unless otherwise noted.  HAND DOMINANCE: Right  ADLs: Overall ADLs: Pt is unable to use the RUE for ADLs. She can hold paper plate between her fingers. She is unable to make a fist or use her hand and arm. Pt is using the LUE for all tasks, using hemi-dressing techniques  and strategies for independence.    FUNCTIONAL OUTCOME MEASURES: Quick Dash: 72.73 06/08/23: 18.18  UPPER EXTREMITY ROM:       Assessed in sitting, er/IR adducted  Passive ROM Right eval Right 05/07/23 Right 06/08/23  Shoulder flexion 61 84 88  Shoulder abduction 54 66 77  Shoulder internal rotation 90 90 90  Shoulder external rotation 19 28 28   Elbow flexion 136 145 145  Elbow extension -30 -5 -5  Wrist flexion 55 70 75  Wrist extension 8 22 48  Wrist  ulnar deviation 25    Wrist radial deviation 8 11 15   Wrist pronation 85 90   Wrist supination 85 90   (Blank rows = not tested)   Assessed in sitting, er/IR adducted  Active ROM Right eval Right 05/07/23 Right 06/08/23  Shoulder flexion 33 68 81  Shoulder abduction 41 56 71  Shoulder internal rotation 90 90 90  Shoulder external rotation 20 34 35  Elbow flexion 128 142 146  Elbow extension -45 -10 -10  Wrist flexion 55 60 62  Wrist extension 8 12 30   Wrist ulnar deviation 25    Wrist radial deviation 4 5 8   Wrist pronation 70 90 90  Wrist supination 80 88 88  (Blank rows = not tested)      Active ROM Right 05/07/23 Right 06/08/23  Thumb MCP (0-60) 34 36  Thumb IP (0-80) 12 26  Index MCP (0-90) 50 50  Index PIP (0-100) 52 64  Index DIP (0-70)  12 20  Long MCP (0-90)  42 48  Long PIP (0-100)  40 52  Long DIP (0-70)  0 0  Ring MCP (0-90)  30 44  Ring PIP (0-100)  34 54  Ring DIP (0-70)  0 0  Little MCP (0-90)  32 32  Little PIP (0-100)  36 52  Little DIP (0-70)  0 0  (Blank rows = not tested)  UPPER EXTREMITY MMT:       Unable to assess due to pain, limited active movement   12/2: shoulder evaluated on observation; MMT for elbow and wrist  MMT Right eval Right 06/08/23  Shoulder flexion 3-/5 3-/5  Shoulder abduction 3-/5 3-/5  Shoulder internal rotation 3/5 3/5  Shoulder external rotation 3/5 3/5  Elbow flexion 4/5 4+/5  Elbow extension 4-/5 4-/5  Wrist flexion 3+/5 4/5  Wrist extension 3+/5 4-/5  Wrist ulnar deviation 4/5 4/5  Wrist radial deviation 3+/5 4/5  Wrist pronation 4-/5 4+/5  Wrist supination 4-/5 4+/5  (Blank rows = not tested)  HAND FUNCTION: Unable, TBD   COORDINATION: Unable, TBD  SENSATION: WFL  EDEMA:  -Right  -Left Upper arm    32cm 30.5cm Elbow           29cm 24cm Mid-forearm 20cm 20.5cm Wrist            17cm 14.5cm Palm            18.5cm 17cm  05/07/23 -Right   Upper arm    31cm  Elbow           25.5cm Mid-forearm  20cm  Wrist            15.5cm  Palm            17.5cm   06/08/23 -Right   Upper arm    30.5cm  Elbow           26.5cm Mid-forearm  20.75cm  Wrist  15.5cm  Palm            17.25cm  OBSERVATIONS: pt with generalized edema throughout RUE; joint stiffness in digits, wrist, elbow, and shoulder. Consistent with possibility of developing CRPS noting dx of regional pain syndrome of RUE   TODAY'S TREATMENT:                                                                                                                              DATE:  06/30/23 -Paraffin: right hand, 10'  -Manual: retrograde massage and myofascial release to right hand and digits, anterior shoulder, trapezius, and scapular regions to decrease stiffness and increase joint ROM in RUE -P/ROM: passive stretching to right hand and digits, 5 reps -A/ROM: standing-flexion, abduction, protraction, horizontal abduction, er/IR, 10 reps -Scrub and carry: 3' scrub using towel roll followed by 3' carry with 8# weight, 3 rounds  06/18/23 -Paraffin: right hand, 10'  -Manual: retrograde massage and myofascial release to right hand and digits, anterior shoulder, trapezius, and scapular regions to decrease stiffness and increase joint ROM in RUE -P/ROM: passive stretching to right hand and digits, 5 reps -A/ROM: supine-flexion, abduction, protraction, horizontal abduction, er/IR, 10 reps -Proximal shoulder strengthening: supine-paddles, criss cross, circles each direction, 10 reps each  -Functional reaching: pt placing 5 cones on middle shelf of overhead cabinet in flexion, then removing -Scrub and carry: 3' scrub followed by 3' carry round 1, 2' scrub followed by 2' carry round 2  06/14/23 -Paraffin: right hand, 10'  -Manual: retrograde massage and myofascial release to right hand and digits, anterior shoulder, trapezius, and scapular regions to decrease stiffness and increase joint ROM in RUE -P/ROM: passive stretching to right hand  and digits, 5 reps -A/ROM: supine; flexion, abduction, protraction, horizontal abduction, er/IR, 10 reps -A/ROM: standing-flexion, abduction, protraction, horizontal abduction, er/IR, 10 reps -Proximal shoulder strengthening: supine-paddles, criss cross, circles each direction, 10 reps each       PATIENT EDUCATION: Education details: scrub and carry protocol Person educated: Patient Education method: Explanation, Demonstration, Tactile cues, and Verbal cues Education comprehension: verbalized understanding and returned demonstration  HOME EXERCISE PROGRAM: Eval: Provided shoulder length edema glove/sleeve, educated on donning/doffing, wear and care; continue table slides from [facility] therapist 11/8: AA/ROM in supine 11/15: wrist stretch into extension  11/27: flexion glove wear 12/9: Wall Climbs 12/11: Towel Roll gripping and wringing out 12/20: A/ROM 12/30: shoulder stretch at wall  06/12/23: grasping and manipulating items such as cotton balls 06/20/23: scrub and carry protocol  GOALS: Goals reviewed with patient? Yes  SHORT TERM GOALS: Target date: 05/09/23  Pt will be provided with and educated on HEP to improve RUE functioning required for use during ADLs.   Goal status: IN PROGRESS  2.  Pt will increase P/ROM of right shoulder by 50+ degrees to improve ability to perform dressing and bathing tasks independently.   Goal status: IN PROGRESS  3.  Pt will increase A/ROM of right elbow to full  extension to improve ability to perform low level functional reaching tasks.   Goal status: IN PROGRESS  4.  Pt will decrease edema in RUE by 50% or greater to improve mobility in RUE required for active participation in bathing and grooming tasks.   Goal status: MET  5.  Pt will demonstrate ability to make at least 50% of a grasp to promote use when grasping large items such as a cup or water bottle.   Goal status: IN PROGRESS   LONG TERM GOALS: Target date: 06/09/23  Pt  will decrease pain in the RUE to 2/10 or less to improve ability to sleep for 3+ consecutive hours without waking due to pain.   Goal status: IN PROGRESS  2.  Pt will increase RUE A/ROM to at least 120 degrees shoulder flexion/abduction, and wrist extension by 25+ degrees to improve use of RUE during meal preparation tasks.   Goal status: IN PROGRESS  3.  Pt will increase RUE strength to 4-/5 or greater to improve ability to use RUE during housework tasks.   Goal status: IN PROGRESS  4.  Pt will increase grip strength to at least 20# and pinch strength to at least 5# to improve ability to grasp and carry groceries.  Baseline:  Goal status: IN PROGRESS  5.  Pt will decrease RUE fascial restrictions to min amounts or less to improve ability to perform functional reaching tasks.   Goal status: IN PROGRESS   ASSESSMENT:   CLINICAL IMPRESSION: Pt reports she has been trying to carry weight at home after previous session. Continued with paraffin and manual techniques to RUE. Shoulder A/ROM completed then transitioned to scrub and carry protocol, increasing to 3 full rounds. Pt with min/mod fatigue, tolerating well. Provided protocol for HEP. Verbal cuing for form and technique during tasks.   PERFORMANCE DEFICITS: in functional skills including ADLs, IADLs, coordination, dexterity, proprioception, edema, ROM, strength, pain, fascial restrictions, Fine motor control, Gross motor control, and UE functional use    PLAN:  OT FREQUENCY: 2x/week  OT DURATION: 8 weeks  PLANNED INTERVENTIONS: 97168 OT Re-evaluation, 97535 self care/ADL training, 16109 therapeutic exercise, 97530 therapeutic activity, 97140 manual therapy, 97035 ultrasound, 97018 paraffin, 60454 moist heat, 97010 cryotherapy, 97032 electrical stimulation (manual), 97014 electrical stimulation unattended, 97760 Splinting (initial encounter), and DME and/or AE instructions  CONSULTED AND AGREED WITH PLAN OF CARE: Patient  PLAN  FOR NEXT SESSION: retrograde massage and manual techniques, A/ROM, functional reaching. Follow up on HEP; adjust flexion glove as able. Begin session with paraffin to hand; continue with scrub and carry   Lafonda Piety, OTR/L  [phone] 06/20/2023, 11:55 AM

## 2023-06-25 ENCOUNTER — Ambulatory Visit (HOSPITAL_COMMUNITY): Payer: No Typology Code available for payment source | Admitting: Occupational Therapy

## 2023-06-25 ENCOUNTER — Encounter (HOSPITAL_COMMUNITY): Payer: Self-pay | Admitting: Occupational Therapy

## 2023-06-25 DIAGNOSIS — M25641 Stiffness of right hand, not elsewhere classified: Secondary | ICD-10-CM

## 2023-06-25 DIAGNOSIS — R29898 Other symptoms and signs involving the musculoskeletal system: Secondary | ICD-10-CM

## 2023-06-25 DIAGNOSIS — M25611 Stiffness of right shoulder, not elsewhere classified: Secondary | ICD-10-CM

## 2023-06-25 DIAGNOSIS — M25621 Stiffness of right elbow, not elsewhere classified: Secondary | ICD-10-CM

## 2023-06-25 DIAGNOSIS — M25511 Pain in right shoulder: Secondary | ICD-10-CM

## 2023-06-25 NOTE — Therapy (Signed)
OUTPATIENT OCCUPATIONAL THERAPY ORTHO TREATMENT   Patient Name: Stephanie Ramos MRN: [id number] DOB:[birth year], 78 y.o., female Today's Date: 06/25/2023    END OF SESSION:  OT End of Session - 06/25/23 1435     Visit Number 26    Number of Visits 37    Date for OT Re-Evaluation 08/07/23    Authorization Type [employer], copay: $15    Authorization Time Period no auth required    OT Start Time 1348    OT Stop Time 1433    OT Time Calculation (min) 45 min    Activity Tolerance Patient tolerated treatment well    Behavior During Therapy WFL for tasks assessed/performed             Past Medical History:  Diagnosis Date   High iron content of liver determined by magnetic resonance imaging 08/02/2021   Hyperlipemia    Past Surgical History:  Procedure Laterality Date   BREAST SURGERY     CATARACT EXTRACTION W/PHACO Left 08/11/2019   Procedure: CATARACT EXTRACTION PHACO AND INTRAOCULAR LENS PLACEMENT (IOC) (CDE: 9.68);  Surgeon: [name], MD;  Location: [facility];  Service: Ophthalmology;  Laterality: Left;   CATARACT EXTRACTION W/PHACO Right 09/29/2019   Procedure: CATARACT EXTRACTION PHACO AND INTRAOCULAR LENS PLACEMENT RIGHT EYE;  Surgeon: [name], MD;  Location: [facility];  Service: Ophthalmology;  Laterality: Right;  CDE: 10.60   DILITATION & CURRETTAGE/HYSTROSCOPY WITH ESSURE     Patient Active Problem List   Diagnosis Date Noted   Comminuted fracture of right humerus 03/29/2023   Preventative health care 03/10/2022   Hypercalcemia 09/08/2021   High iron content of liver determined by magnetic resonance imaging 08/02/2021   Hyperkalemia 07/21/2021   Elevated ferritin level 06/24/2021   Fatigue 05/20/2021   Iron deficiency 01/18/2021   Elevated BP without diagnosis of hypertension 01/18/2021   Overweight with body mass index (BMI) of 26 to 26.9 in adult 03/18/2020   Refused influenza vaccine 03/18/2020   Vertigo 12/28/2017   Hyperlipemia 12/28/2017    PCP: Dr. Christel Mormon REFERRING PROVIDER: Dr. Teryl Lucy  ONSET DATE: 02/15/23  REFERRING DIAG: rt prox humerus fx with regional pain syndrome of right upper etremity   THERAPY DIAG:  Acute pain of right shoulder  Stiffness of right shoulder, not elsewhere classified  Stiffness of right hand, not elsewhere classified  Stiffness of right elbow, not elsewhere classified  Other symptoms and signs involving the musculoskeletal system  Rationale for Evaluation and Treatment: Rehabilitation  SUBJECTIVE:   SUBJECTIVE STATEMENT: S: I've been carrying a weight.  PERTINENT HISTORY: Pt is a 78 y/o female presenting s/p right proximal humerus fracture after falling when [place] the woods on 02/15/23. Pt attended therapy at [facility] approximately 4 times before transferring to this clinic.   PRECAUTIONS: Shoulder  WEIGHT BEARING RESTRICTIONS: Yes NWB  PAIN:  Are you having pain? No  FALLS: Has patient fallen in last 6 months? Yes. Number of falls 1  PLOF: Independent  PATIENT GOALS: To get the right arm moving again.   NEXT MD VISIT: 6 weeks from 06/11/23  OBJECTIVE:  Note: Objective measures were completed at Evaluation unless otherwise noted.  HAND DOMINANCE: Right  ADLs: Overall ADLs: Pt is unable to use the RUE for ADLs. She can hold paper plate between her fingers. She is unable to make a fist or use her hand and arm. Pt is using the LUE for all tasks, using hemi-dressing techniques and strategies for independence.  FUNCTIONAL OUTCOME MEASURES: Quick Dash: 72.73 06/08/23: 18.18  UPPER EXTREMITY ROM:       Assessed in sitting, er/IR adducted  Passive ROM Right eval Right 05/07/23 Right 06/08/23  Shoulder flexion 61 84 88  Shoulder abduction 54 66 77  Shoulder internal rotation 90 90 90  Shoulder external rotation 19 28 28   Elbow flexion 136 145 145  Elbow extension -30 -5 -5  Wrist flexion 55 70 75  Wrist extension 8 22 48  Wrist ulnar  deviation 25    Wrist radial deviation 8 11 15   Wrist pronation 85 90   Wrist supination 85 90   (Blank rows = not tested)   Assessed in sitting, er/IR adducted  Active ROM Right eval Right 05/07/23 Right 06/08/23  Shoulder flexion 33 68 81  Shoulder abduction 41 56 71  Shoulder internal rotation 90 90 90  Shoulder external rotation 20 34 35  Elbow flexion 128 142 146  Elbow extension -45 -10 -10  Wrist flexion 55 60 62  Wrist extension 8 12 30   Wrist ulnar deviation 25    Wrist radial deviation 4 5 8   Wrist pronation 70 90 90  Wrist supination 80 88 88  (Blank rows = not tested)      Active ROM Right 05/07/23 Right 06/08/23  Thumb MCP (0-60) 34 36  Thumb IP (0-80) 12 26  Index MCP (0-90) 50 50  Index PIP (0-100) 52 64  Index DIP (0-70)  12 20  Long MCP (0-90)  42 48  Long PIP (0-100)  40 52  Long DIP (0-70)  0 0  Ring MCP (0-90)  30 44  Ring PIP (0-100)  34 54  Ring DIP (0-70)  0 0  Little MCP (0-90)  32 32  Little PIP (0-100)  36 52  Little DIP (0-70)  0 0  (Blank rows = not tested)  UPPER EXTREMITY MMT:       Unable to assess due to pain, limited active movement   12/2: shoulder evaluated on observation; MMT for elbow and wrist  MMT Right eval Right 06/08/23  Shoulder flexion 3-/5 3-/5  Shoulder abduction 3-/5 3-/5  Shoulder internal rotation 3/5 3/5  Shoulder external rotation 3/5 3/5  Elbow flexion 4/5 4+/5  Elbow extension 4-/5 4-/5  Wrist flexion 3+/5 4/5  Wrist extension 3+/5 4-/5  Wrist ulnar deviation 4/5 4/5  Wrist radial deviation 3+/5 4/5  Wrist pronation 4-/5 4+/5  Wrist supination 4-/5 4+/5  (Blank rows = not tested)  HAND FUNCTION: Unable, TBD   COORDINATION: Unable, TBD  SENSATION: WFL  EDEMA:  -Right  -Left Upper arm    32cm 30.5cm Elbow           29cm 24cm Mid-forearm 20cm 20.5cm Wrist            17cm 14.5cm Palm            18.5cm 17cm  05/07/23 -Right   Upper arm    31cm  Elbow           25.5cm Mid-forearm  20cm  Wrist            15.5cm  Palm            17.5cm   06/08/23 -Right   Upper arm    30.5cm  Elbow           26.5cm Mid-forearm  20.75cm  Wrist            15.5cm  Palm  17.25cm  OBSERVATIONS: pt with generalized edema throughout RUE; joint stiffness in digits, wrist, elbow, and shoulder. Consistent with possibility of developing CRPS noting dx of regional pain syndrome of RUE   TODAY'S TREATMENT:                                                                                                                              DATE:   06/25/23 -Manual: retrograde massage and myofascial release to right hand and digits, anterior shoulder, trapezius, and scapular regions to decrease stiffness and increase joint ROM in RUE -A/ROM: standing-flexion, abduction, protraction, horizontal abduction, er/IR, x15 -Functional Reaching: 1# and 2# x10 to shoulder height shelf, no weight to head height shelf -Paraffin: right hand, 10'  -P/ROM: passive stretching to right hand and digits, 5 reps -Digit ROM: composite flexion, opposition, x10 -Scrub and carry: 3' scrub using towel roll followed by 3' carry with 8# weight  06/30/23 -Paraffin: right hand, 10'  -Manual: retrograde massage and myofascial release to right hand and digits, anterior shoulder, trapezius, and scapular regions to decrease stiffness and increase joint ROM in RUE -P/ROM: passive stretching to right hand and digits, 5 reps -A/ROM: standing-flexion, abduction, protraction, horizontal abduction, er/IR, 10 reps -Scrub and carry: 3' scrub using towel roll followed by 3' carry with 8# weight, 3 rounds  06/18/23 -Paraffin: right hand, 10'  -Manual: retrograde massage and myofascial release to right hand and digits, anterior shoulder, trapezius, and scapular regions to decrease stiffness and increase joint ROM in RUE -P/ROM: passive stretching to right hand and digits, 5 reps -A/ROM: supine-flexion, abduction, protraction, horizontal  abduction, er/IR, 10 reps -Proximal shoulder strengthening: supine-paddles, criss cross, circles each direction, 10 reps each  -Functional reaching: pt placing 5 cones on middle shelf of overhead cabinet in flexion, then removing -Scrub and carry: 3' scrub followed by 3' carry round 1, 2' scrub followed by 2' carry round 2    PATIENT EDUCATION: Education details: Continue HEP Person educated: Patient Education method: Explanation, Demonstration, Tactile cues, and Verbal cues Education comprehension: verbalized understanding and returned demonstration  HOME EXERCISE PROGRAM: Eval: Provided shoulder length edema glove/sleeve, educated on donning/doffing, wear and care; continue table slides from [facility] therapist 11/8: AA/ROM in supine 11/15: wrist stretch into extension  11/27: flexion glove wear 12/9: Wall Climbs 12/11: Towel Roll gripping and wringing out 12/20: A/ROM 12/30: shoulder stretch at wall  06/12/23: grasping and manipulating items such as cotton balls 06/20/23: scrub and carry protocol  GOALS: Goals reviewed with patient? Yes  SHORT TERM GOALS: Target date: 05/09/23  Pt will be provided with and educated on HEP to improve RUE functioning required for use during ADLs.   Goal status: IN PROGRESS  2.  Pt will increase P/ROM of right shoulder by 50+ degrees to improve ability to perform dressing and bathing tasks independently.   Goal status: IN PROGRESS  3.  Pt will increase A/ROM of right elbow to full extension to improve ability  to perform low level functional reaching tasks.   Goal status: IN PROGRESS  4.  Pt will decrease edema in RUE by 50% or greater to improve mobility in RUE required for active participation in bathing and grooming tasks.   Goal status: MET  5.  Pt will demonstrate ability to make at least 50% of a grasp to promote use when grasping large items such as a cup or water bottle.   Goal status: IN PROGRESS   LONG TERM GOALS: Target  date: 06/09/23  Pt will decrease pain in the RUE to 2/10 or less to improve ability to sleep for 3+ consecutive hours without waking due to pain.   Goal status: IN PROGRESS  2.  Pt will increase RUE A/ROM to at least 120 degrees shoulder flexion/abduction, and wrist extension by 25+ degrees to improve use of RUE during meal preparation tasks.   Goal status: IN PROGRESS  3.  Pt will increase RUE strength to 4-/5 or greater to improve ability to use RUE during housework tasks.   Goal status: IN PROGRESS  4.  Pt will increase grip strength to at least 20# and pinch strength to at least 5# to improve ability to grasp and carry groceries.  Baseline:  Goal status: IN PROGRESS  5.  Pt will decrease RUE fascial restrictions to min amounts or less to improve ability to perform functional reaching tasks.   Goal status: IN PROGRESS   ASSESSMENT:   CLINICAL IMPRESSION: This session, pt continuing to report that overall she is not having any pain only stiffness. She was able to continue working on improving her functional endurance, as well as strength and mobility in her shoulder with functional reaching. Continued with paraffin and manual techniques to RUE. OT providing verbal and tactile cuing throughout for positioning and technique.   PERFORMANCE DEFICITS: in functional skills including ADLs, IADLs, coordination, dexterity, proprioception, edema, ROM, strength, pain, fascial restrictions, Fine motor control, Gross motor control, and UE functional use    PLAN:  OT FREQUENCY: 2x/week  OT DURATION: 8 weeks  PLANNED INTERVENTIONS: 97168 OT Re-evaluation, 97535 self care/ADL training, 06301 therapeutic exercise, 97530 therapeutic activity, 97140 manual therapy, 97035 ultrasound, 97018 paraffin, 60109 moist heat, 97010 cryotherapy, 97032 electrical stimulation (manual), 97014 electrical stimulation unattended, 97760 Splinting (initial encounter), and DME and/or AE instructions  CONSULTED AND  AGREED WITH PLAN OF CARE: Patient  PLAN FOR NEXT SESSION: retrograde massage and manual techniques, A/ROM, functional reaching. Follow up on HEP; adjust flexion glove as able. Begin session with paraffin to hand; continue with scrub and carry   Trish Mage, OTR/L [phone] 06/25/2023, 2:36 PM

## 2023-06-28 ENCOUNTER — Ambulatory Visit (HOSPITAL_COMMUNITY): Payer: No Typology Code available for payment source | Admitting: Occupational Therapy

## 2023-06-28 ENCOUNTER — Encounter (HOSPITAL_COMMUNITY): Payer: Self-pay | Admitting: Occupational Therapy

## 2023-06-28 DIAGNOSIS — M25511 Pain in right shoulder: Secondary | ICD-10-CM | POA: Diagnosis not present

## 2023-06-28 DIAGNOSIS — R29898 Other symptoms and signs involving the musculoskeletal system: Secondary | ICD-10-CM

## 2023-06-28 DIAGNOSIS — M25641 Stiffness of right hand, not elsewhere classified: Secondary | ICD-10-CM

## 2023-06-28 DIAGNOSIS — M25621 Stiffness of right elbow, not elsewhere classified: Secondary | ICD-10-CM

## 2023-06-28 DIAGNOSIS — M25611 Stiffness of right shoulder, not elsewhere classified: Secondary | ICD-10-CM

## 2023-06-28 NOTE — Therapy (Signed)
OUTPATIENT OCCUPATIONAL THERAPY ORTHO TREATMENT   Patient Name: Stephanie Ramos MRN: [id number] DOB:1946-03-22, 78 y.o., female Today's Date: 06/28/2023    END OF SESSION:  OT End of Session - 06/28/23 1431     Visit Number 27    Number of Visits 37    Date for OT Re-Evaluation 08/07/23    Authorization Type [employer], copay: $15    Authorization Time Period no auth required    OT Start Time 1351    OT Stop Time 1431    OT Time Calculation (min) 40 min    Activity Tolerance Patient tolerated treatment well    Behavior During Therapy WFL for tasks assessed/performed             Past Medical History:  Diagnosis Date   High iron content of liver determined by magnetic resonance imaging 08/02/2021   Hyperlipemia    Past Surgical History:  Procedure Laterality Date   BREAST SURGERY     CATARACT EXTRACTION W/PHACO Left 08/11/2019   Procedure: CATARACT EXTRACTION PHACO AND INTRAOCULAR LENS PLACEMENT (IOC) (CDE: 9.68);  Surgeon: [name], MD;  Location: [facility];  Service: Ophthalmology;  Laterality: Left;   CATARACT EXTRACTION W/PHACO Right 09/29/2019   Procedure: CATARACT EXTRACTION PHACO AND INTRAOCULAR LENS PLACEMENT RIGHT EYE;  Surgeon: [name], MD;  Location: [facility];  Service: Ophthalmology;  Laterality: Right;  CDE: 10.60   DILITATION & CURRETTAGE/HYSTROSCOPY WITH ESSURE     Patient Active Problem List   Diagnosis Date Noted   Comminuted fracture of right humerus 03/29/2023   Preventative health care 03/10/2022   Hypercalcemia 09/08/2021   High iron content of liver determined by magnetic resonance imaging 08/02/2021   Hyperkalemia 07/21/2021   Elevated ferritin level 06/24/2021   Fatigue 05/20/2021   Iron deficiency 01/18/2021   Elevated BP without diagnosis of hypertension 01/18/2021   Overweight with body mass index (BMI) of 26 to 26.9 in adult 03/18/2020   Refused influenza vaccine 03/18/2020   Vertigo 12/28/2017   Hyperlipemia 12/28/2017    PCP: Dr. Christel Mormon REFERRING PROVIDER: Dr. Teryl Lucy  ONSET DATE: 02/15/23  REFERRING DIAG: rt prox humerus fx with regional pain syndrome of right upper etremity   THERAPY DIAG:  Acute pain of right shoulder  Stiffness of right shoulder, not elsewhere classified  Stiffness of right hand, not elsewhere classified  Stiffness of right elbow, not elsewhere classified  Other symptoms and signs involving the musculoskeletal system  Rationale for Evaluation and Treatment: Rehabilitation  SUBJECTIVE:   SUBJECTIVE STATEMENT: S: I think my fingers are moving a bit better  PERTINENT HISTORY: Pt is a 78 y/o female presenting s/p right proximal humerus fracture after falling when [place] up in the woods on 02/15/23. Pt attended therapy at [facility] approximately 4 times before transferring to this clinic.   PRECAUTIONS: Shoulder  WEIGHT BEARING RESTRICTIONS: Yes NWB  PAIN:  Are you having pain? No  FALLS: Has patient fallen in last 6 months? Yes. Number of falls 1  PLOF: Independent  PATIENT GOALS: To get the right arm moving again.   NEXT MD VISIT: 6 weeks from 06/11/23  OBJECTIVE:  Note: Objective measures were completed at Evaluation unless otherwise noted.  HAND DOMINANCE: Right  ADLs: Overall ADLs: Pt is unable to use the RUE for ADLs. She can hold paper plate between her fingers. She is unable to make a fist or use her hand and arm. Pt is using the LUE for all tasks, using hemi-dressing techniques and strategies  for independence.    FUNCTIONAL OUTCOME MEASURES: Quick Dash: 72.73 06/08/23: 18.18  UPPER EXTREMITY ROM:       Assessed in sitting, er/IR adducted  Passive ROM Right eval Right 05/07/23 Right 06/08/23  Shoulder flexion 61 84 88  Shoulder abduction 54 66 77  Shoulder internal rotation 90 90 90  Shoulder external rotation 19 28 28   Elbow flexion 136 145 145  Elbow extension -30 -5 -5  Wrist flexion 55 70 75  Wrist extension 8 22 48   Wrist ulnar deviation 25    Wrist radial deviation 8 11 15   Wrist pronation 85 90   Wrist supination 85 90   (Blank rows = not tested)   Assessed in sitting, er/IR adducted  Active ROM Right eval Right 05/07/23 Right 06/08/23  Shoulder flexion 33 68 81  Shoulder abduction 41 56 71  Shoulder internal rotation 90 90 90  Shoulder external rotation 20 34 35  Elbow flexion 128 142 146  Elbow extension -45 -10 -10  Wrist flexion 55 60 62  Wrist extension 8 12 30   Wrist ulnar deviation 25    Wrist radial deviation 4 5 8   Wrist pronation 70 90 90  Wrist supination 80 88 88  (Blank rows = not tested)      Active ROM Right 05/07/23 Right 06/08/23  Thumb MCP (0-60) 34 36  Thumb IP (0-80) 12 26  Index MCP (0-90) 50 50  Index PIP (0-100) 52 64  Index DIP (0-70)  12 20  Long MCP (0-90)  42 48  Long PIP (0-100)  40 52  Long DIP (0-70)  0 0  Ring MCP (0-90)  30 44  Ring PIP (0-100)  34 54  Ring DIP (0-70)  0 0  Little MCP (0-90)  32 32  Little PIP (0-100)  36 52  Little DIP (0-70)  0 0  (Blank rows = not tested)  UPPER EXTREMITY MMT:       Unable to assess due to pain, limited active movement   12/2: shoulder evaluated on observation; MMT for elbow and wrist  MMT Right eval Right 06/08/23  Shoulder flexion 3-/5 3-/5  Shoulder abduction 3-/5 3-/5  Shoulder internal rotation 3/5 3/5  Shoulder external rotation 3/5 3/5  Elbow flexion 4/5 4+/5  Elbow extension 4-/5 4-/5  Wrist flexion 3+/5 4/5  Wrist extension 3+/5 4-/5  Wrist ulnar deviation 4/5 4/5  Wrist radial deviation 3+/5 4/5  Wrist pronation 4-/5 4+/5  Wrist supination 4-/5 4+/5  (Blank rows = not tested)  HAND FUNCTION: Unable, TBD   COORDINATION: Unable, TBD  SENSATION: WFL  EDEMA:  -Right  -Left Upper arm    32cm 30.5cm Elbow           29cm 24cm Mid-forearm 20cm 20.5cm Wrist            17cm 14.5cm Palm            18.5cm 17cm  05/07/23 -Right   Upper arm    31cm  Elbow            25.5cm Mid-forearm 20cm  Wrist            15.5cm  Palm            17.5cm   06/08/23 -Right   Upper arm    30.5cm  Elbow           26.5cm Mid-forearm  20.75cm  Wrist  15.5cm  Palm            17.25cm  OBSERVATIONS: pt with generalized edema throughout RUE; joint stiffness in digits, wrist, elbow, and shoulder. Consistent with possibility of developing CRPS noting dx of regional pain syndrome of RUE   TODAY'S TREATMENT:                                                                                                                              DATE:   06/28/23 -Pulleys: flexion, abduction, x60" -A/ROM: standing-flexion, abduction, protraction, horizontal abduction, er/IR, x15 -X to V arms, x10 -Goal Post Arms, x10 -Scapular Strengthening: red band, extension, retraction, rows, x15 -Paraffin: right hand, 10'  -P/ROM: passive stretching to right hand and digits, 5 reps -Digit ROM: composite flexion, abduction/adduction, opposition, x10 -Digit Blocking: each digit at MCP, PIP, and DIP, x5  06/25/23 -Manual: retrograde massage and myofascial release to right hand and digits, anterior shoulder, trapezius, and scapular regions to decrease stiffness and increase joint ROM in RUE -A/ROM: standing-flexion, abduction, protraction, horizontal abduction, er/IR, x15 -Functional Reaching: 1# and 2# x10 to shoulder height shelf, no weight to head height shelf -Paraffin: right hand, 10'  -P/ROM: passive stretching to right hand and digits, 5 reps -Digit ROM: composite flexion, opposition, x10 -Scrub and carry: 3' scrub using towel roll followed by 3' carry with 8# weight  06/30/23 -Paraffin: right hand, 10'  -Manual: retrograde massage and myofascial release to right hand and digits, anterior shoulder, trapezius, and scapular regions to decrease stiffness and increase joint ROM in RUE -P/ROM: passive stretching to right hand and digits, 5 reps -A/ROM: standing-flexion, abduction,  protraction, horizontal abduction, er/IR, 10 reps -Scrub and carry: 3' scrub using towel roll followed by 3' carry with 8# weight, 3 rounds   PATIENT EDUCATION: Education details: Reviewed Self Digit Blocking Person educated: Patient Education method: Explanation, Demonstration, Tactile cues, and Verbal cues Education comprehension: verbalized understanding and returned demonstration  HOME EXERCISE PROGRAM: Eval: Provided shoulder length edema glove/sleeve, educated on donning/doffing, wear and care; continue table slides from [facility] therapist 11/8: AA/ROM in supine 11/15: wrist stretch into extension  11/27: flexion glove wear 12/9: Wall Climbs 12/11: Towel Roll gripping and wringing out 12/20: A/ROM 12/30: shoulder stretch at wall  06/12/23: grasping and manipulating items such as cotton balls 06/20/23: scrub and carry protocol 1/23: Self Digit Blocking  GOALS: Goals reviewed with patient? Yes  SHORT TERM GOALS: Target date: 05/09/23  Pt will be provided with and educated on HEP to improve RUE functioning required for use during ADLs.   Goal status: IN PROGRESS  2.  Pt will increase P/ROM of right shoulder by 50+ degrees to improve ability to perform dressing and bathing tasks independently.   Goal status: IN PROGRESS  3.  Pt will increase A/ROM of right elbow to full extension to improve ability to perform low level functional reaching tasks.   Goal status: IN PROGRESS  4.  Pt will  decrease edema in RUE by 50% or greater to improve mobility in RUE required for active participation in bathing and grooming tasks.   Goal status: MET  5.  Pt will demonstrate ability to make at least 50% of a grasp to promote use when grasping large items such as a cup or water bottle.   Goal status: IN PROGRESS   LONG TERM GOALS: Target date: 06/09/23  Pt will decrease pain in the RUE to 2/10 or less to improve ability to sleep for 3+ consecutive hours without waking due to pain.    Goal status: IN PROGRESS  2.  Pt will increase RUE A/ROM to at least 120 degrees shoulder flexion/abduction, and wrist extension by 25+ degrees to improve use of RUE during meal preparation tasks.   Goal status: IN PROGRESS  3.  Pt will increase RUE strength to 4-/5 or greater to improve ability to use RUE during housework tasks.   Goal status: IN PROGRESS  4.  Pt will increase grip strength to at least 20# and pinch strength to at least 5# to improve ability to grasp and carry groceries.  Baseline:  Goal status: IN PROGRESS  5.  Pt will decrease RUE fascial restrictions to min amounts or less to improve ability to perform functional reaching tasks.   Goal status: IN PROGRESS   ASSESSMENT:   CLINICAL IMPRESSION: This session, pt reports no pain and improving mobility in her hand. She tolerated all shoulder exercises well with incremental improvements in both ROM and strength. She is achieving approximately 60% of full ROM actively and tolerated the red theraband well for resistance exercises. Continued paraffin bath and passive ROM of each finger to stretch them out and improve mobility. Verbal and tactile cuing provided for positioning and technique throughout session.   PERFORMANCE DEFICITS: in functional skills including ADLs, IADLs, coordination, dexterity, proprioception, edema, ROM, strength, pain, fascial restrictions, Fine motor control, Gross motor control, and UE functional use    PLAN:  OT FREQUENCY: 2x/week  OT DURATION: 8 weeks  PLANNED INTERVENTIONS: 97168 OT Re-evaluation, 97535 self care/ADL training, 16109 therapeutic exercise, 97530 therapeutic activity, 97140 manual therapy, 97035 ultrasound, 97018 paraffin, 60454 moist heat, 97010 cryotherapy, 97032 electrical stimulation (manual), 97014 electrical stimulation unattended, 97760 Splinting (initial encounter), and DME and/or AE instructions  CONSULTED AND AGREED WITH PLAN OF CARE: Patient  PLAN FOR NEXT  SESSION: retrograde massage and manual techniques, A/ROM, functional reaching. Follow up on HEP; adjust flexion glove as able. Begin session with paraffin to hand; continue with scrub and carry   Trish Mage, OTR/L [phone] 06/28/2023, 2:58 PM

## 2023-07-02 ENCOUNTER — Other Ambulatory Visit: Payer: Self-pay | Admitting: Internal Medicine

## 2023-07-02 ENCOUNTER — Ambulatory Visit (HOSPITAL_COMMUNITY): Payer: No Typology Code available for payment source | Admitting: Occupational Therapy

## 2023-07-02 ENCOUNTER — Encounter (HOSPITAL_COMMUNITY): Payer: Self-pay | Admitting: Occupational Therapy

## 2023-07-02 DIAGNOSIS — M25511 Pain in right shoulder: Secondary | ICD-10-CM

## 2023-07-02 DIAGNOSIS — M25621 Stiffness of right elbow, not elsewhere classified: Secondary | ICD-10-CM

## 2023-07-02 DIAGNOSIS — M25641 Stiffness of right hand, not elsewhere classified: Secondary | ICD-10-CM

## 2023-07-02 DIAGNOSIS — E7841 Elevated Lipoprotein(a): Secondary | ICD-10-CM

## 2023-07-02 DIAGNOSIS — M25611 Stiffness of right shoulder, not elsewhere classified: Secondary | ICD-10-CM

## 2023-07-02 DIAGNOSIS — R29898 Other symptoms and signs involving the musculoskeletal system: Secondary | ICD-10-CM

## 2023-07-02 NOTE — Therapy (Signed)
OUTPATIENT OCCUPATIONAL THERAPY ORTHO TREATMENT   Patient Name: Stephanie Ramos MRN: [id number] DOB:Jul 03, 1945, 78 y.o., female Today's Date: 07/02/2023    END OF SESSION:  OT End of Session - 07/02/23 1547     Visit Number 28    Number of Visits 37    Date for OT Re-Evaluation 08/07/23    Authorization Type [employer], copay: $15    Authorization Time Period no auth required    OT Start Time 1350    OT Stop Time 1432    OT Time Calculation (min) 42 min    Activity Tolerance Patient tolerated treatment well    Behavior During Therapy WFL for tasks assessed/performed             Past Medical History:  Diagnosis Date   High iron content of liver determined by magnetic resonance imaging 08/02/2021   Hyperlipemia    Past Surgical History:  Procedure Laterality Date   BREAST SURGERY     CATARACT EXTRACTION W/PHACO Left 08/11/2019   Procedure: CATARACT EXTRACTION PHACO AND INTRAOCULAR LENS PLACEMENT (IOC) (CDE: 9.68);  Surgeon: [name], MD;  Location: [facility];  Service: Ophthalmology;  Laterality: Left;   CATARACT EXTRACTION W/PHACO Right 09/29/2019   Procedure: CATARACT EXTRACTION PHACO AND INTRAOCULAR LENS PLACEMENT RIGHT EYE;  Surgeon: [name], MD;  Location: [facility];  Service: Ophthalmology;  Laterality: Right;  CDE: 10.60   DILITATION & CURRETTAGE/HYSTROSCOPY WITH ESSURE     Patient Active Problem List   Diagnosis Date Noted   Comminuted fracture of right humerus 03/29/2023   Preventative health care 03/10/2022   Hypercalcemia 09/08/2021   High iron content of liver determined by magnetic resonance imaging 08/02/2021   Hyperkalemia 07/21/2021   Elevated ferritin level 06/24/2021   Fatigue 05/20/2021   Iron deficiency 01/18/2021   Elevated BP without diagnosis of hypertension 01/18/2021   Overweight with body mass index (BMI) of 26 to 26.9 in adult 03/18/2020   Refused influenza vaccine 03/18/2020   Vertigo 12/28/2017   Hyperlipemia 12/28/2017    PCP: Dr. Christel Mormon REFERRING PROVIDER: Dr. Teryl Lucy  ONSET DATE: 02/15/23  REFERRING DIAG: rt prox humerus fx with regional pain syndrome of right upper etremity   THERAPY DIAG:  Acute pain of right shoulder  Stiffness of right shoulder, not elsewhere classified  Stiffness of right hand, not elsewhere classified  Stiffness of right elbow, not elsewhere classified  Other symptoms and signs involving the musculoskeletal system  Rationale for Evaluation and Treatment: Rehabilitation  SUBJECTIVE:   SUBJECTIVE STATEMENT: S: "I had a busy weekend."  PERTINENT HISTORY: Pt is a 78 y/o female presenting s/p right proximal humerus fracture after falling when [place] in the woods on 02/15/23. Pt attended therapy at [facility] approximately 4 times before transferring to this clinic.   PRECAUTIONS: Shoulder  WEIGHT BEARING RESTRICTIONS: Yes NWB  PAIN:  Are you having pain? No  FALLS: Has patient fallen in last 6 months? Yes. Number of falls 1  PLOF: Independent  PATIENT GOALS: To get the right arm moving again.   NEXT MD VISIT: 6 weeks from 06/11/23  OBJECTIVE:  Note: Objective measures were completed at Evaluation unless otherwise noted.  HAND DOMINANCE: Right  ADLs: Overall ADLs: Pt is unable to use the RUE for ADLs. She can hold paper plate between her fingers. She is unable to make a fist or use her hand and arm. Pt is using the LUE for all tasks, using hemi-dressing techniques and strategies for independence.  FUNCTIONAL OUTCOME MEASURES: Quick Dash: 72.73 06/08/23: 18.18  UPPER EXTREMITY ROM:       Assessed in sitting, er/IR adducted  Passive ROM Right eval Right 05/07/23 Right 06/08/23  Shoulder flexion 61 84 88  Shoulder abduction 54 66 77  Shoulder internal rotation 90 90 90  Shoulder external rotation 19 28 28   Elbow flexion 136 145 145  Elbow extension -30 -5 -5  Wrist flexion 55 70 75  Wrist extension 8 22 48  Wrist ulnar deviation  25    Wrist radial deviation 8 11 15   Wrist pronation 85 90   Wrist supination 85 90   (Blank rows = not tested)   Assessed in sitting, er/IR adducted  Active ROM Right eval Right 05/07/23 Right 06/08/23  Shoulder flexion 33 68 81  Shoulder abduction 41 56 71  Shoulder internal rotation 90 90 90  Shoulder external rotation 20 34 35  Elbow flexion 128 142 146  Elbow extension -45 -10 -10  Wrist flexion 55 60 62  Wrist extension 8 12 30   Wrist ulnar deviation 25    Wrist radial deviation 4 5 8   Wrist pronation 70 90 90  Wrist supination 80 88 88  (Blank rows = not tested)      Active ROM Right 05/07/23 Right 06/08/23  Thumb MCP (0-60) 34 36  Thumb IP (0-80) 12 26  Index MCP (0-90) 50 50  Index PIP (0-100) 52 64  Index DIP (0-70)  12 20  Long MCP (0-90)  42 48  Long PIP (0-100)  40 52  Long DIP (0-70)  0 0  Ring MCP (0-90)  30 44  Ring PIP (0-100)  34 54  Ring DIP (0-70)  0 0  Little MCP (0-90)  32 32  Little PIP (0-100)  36 52  Little DIP (0-70)  0 0  (Blank rows = not tested)  UPPER EXTREMITY MMT:       Unable to assess due to pain, limited active movement   12/2: shoulder evaluated on observation; MMT for elbow and wrist  MMT Right eval Right 06/08/23  Shoulder flexion 3-/5 3-/5  Shoulder abduction 3-/5 3-/5  Shoulder internal rotation 3/5 3/5  Shoulder external rotation 3/5 3/5  Elbow flexion 4/5 4+/5  Elbow extension 4-/5 4-/5  Wrist flexion 3+/5 4/5  Wrist extension 3+/5 4-/5  Wrist ulnar deviation 4/5 4/5  Wrist radial deviation 3+/5 4/5  Wrist pronation 4-/5 4+/5  Wrist supination 4-/5 4+/5  (Blank rows = not tested)  HAND FUNCTION: Unable, TBD   COORDINATION: Unable, TBD  SENSATION: WFL  EDEMA:  -Right  -Left Upper arm    32cm 30.5cm Elbow           29cm 24cm Mid-forearm 20cm 20.5cm Wrist            17cm 14.5cm Palm            18.5cm 17cm  05/07/23 -Right   Upper arm    31cm  Elbow           25.5cm Mid-forearm 20cm  Wrist             15.5cm  Palm            17.5cm   06/08/23 -Right   Upper arm    30.5cm  Elbow           26.5cm Mid-forearm  20.75cm  Wrist            15.5cm  Palm  17.25cm  OBSERVATIONS: pt with generalized edema throughout RUE; joint stiffness in digits, wrist, elbow, and shoulder. Consistent with possibility of developing CRPS noting dx of regional pain syndrome of RUE   TODAY'S TREATMENT:                                                                                                                              DATE:   07/02/23 -Paraffin: right hand, 10'  -P/ROM: passive stretching to right hand and digits, 5 reps -Digit ROM: composite flexion, abduction/adduction, opposition, x10 -Digit Blocking: each digit at MCP, PIP, and DIP, x5 -Gripper: 7# picking up 8 medium beads, attempted 11#, unable to fully close -Digiflex: 3#, full squeeze x10, each finger squeeze x10 -Pinch strengthening: red resistance clip, green resistance clip, picking up and stacking 6 cubes  06/28/23 -Pulleys: flexion, abduction, x60" -A/ROM: standing-flexion, abduction, protraction, horizontal abduction, er/IR, x15 -X to V arms, x10 -Goal Post Arms, x10 -Scapular Strengthening: red band, extension, retraction, rows, x15 -Paraffin: right hand, 10'  -P/ROM: passive stretching to right hand and digits, 5 reps -Digit ROM: composite flexion, abduction/adduction, opposition, x10 -Digit Blocking: each digit at MCP, PIP, and DIP, x5  06/25/23 -Manual: retrograde massage and myofascial release to right hand and digits, anterior shoulder, trapezius, and scapular regions to decrease stiffness and increase joint ROM in RUE -A/ROM: standing-flexion, abduction, protraction, horizontal abduction, er/IR, x15 -Functional Reaching: 1# and 2# x10 to shoulder height shelf, no weight to head height shelf -Paraffin: right hand, 10'  -P/ROM: passive stretching to right hand and digits, 5 reps -Digit ROM: composite flexion,  opposition, x10 -Scrub and carry: 3' scrub using towel roll followed by 3' carry with 8# weight   PATIENT EDUCATION: Education details: Continue HEP Person educated: Patient Education method: Explanation, Demonstration, Tactile cues, and Verbal cues Education comprehension: verbalized understanding and returned demonstration  HOME EXERCISE PROGRAM: Eval: Provided shoulder length edema glove/sleeve, educated on donning/doffing, wear and care; continue table slides from [facility] therapist 11/8: AA/ROM in supine 11/15: wrist stretch into extension  11/27: flexion glove wear 12/9: Wall Climbs 12/11: Towel Roll gripping and wringing out 12/20: A/ROM 12/30: shoulder stretch at wall  06/12/23: grasping and manipulating items such as cotton balls 06/20/23: scrub and carry protocol 1/23: Self Digit Blocking  GOALS: Goals reviewed with patient? Yes  SHORT TERM GOALS: Target date: 05/09/23  Pt will be provided with and educated on HEP to improve RUE functioning required for use during ADLs.   Goal status: IN PROGRESS  2.  Pt will increase P/ROM of right shoulder by 50+ degrees to improve ability to perform dressing and bathing tasks independently.   Goal status: IN PROGRESS  3.  Pt will increase A/ROM of right elbow to full extension to improve ability to perform low level functional reaching tasks.   Goal status: IN PROGRESS  4.  Pt will decrease edema in RUE by 50% or greater to improve mobility in RUE required for  active participation in bathing and grooming tasks.   Goal status: MET  5.  Pt will demonstrate ability to make at least 50% of a grasp to promote use when grasping large items such as a cup or water bottle.   Goal status: IN PROGRESS   LONG TERM GOALS: Target date: 06/09/23  Pt will decrease pain in the RUE to 2/10 or less to improve ability to sleep for 3+ consecutive hours without waking due to pain.   Goal status: IN PROGRESS  2.  Pt will increase RUE A/ROM  to at least 120 degrees shoulder flexion/abduction, and wrist extension by 25+ degrees to improve use of RUE during meal preparation tasks.   Goal status: IN PROGRESS  3.  Pt will increase RUE strength to 4-/5 or greater to improve ability to use RUE during housework tasks.   Goal status: IN PROGRESS  4.  Pt will increase grip strength to at least 20# and pinch strength to at least 5# to improve ability to grasp and carry groceries.  Baseline:  Goal status: IN PROGRESS  5.  Pt will decrease RUE fascial restrictions to min amounts or less to improve ability to perform functional reaching tasks.   Goal status: IN PROGRESS   ASSESSMENT:   CLINICAL IMPRESSION: This session focused on hand, digit, and grip mobility. She continues to have severely tight/stiff DIP joints of each finger. OT continued paraffin and P/ROM of each joint to improve mobility and maintain low pain levels. Gripper and pinch strengthening added this session. She was able to tolerate 7# with the gripper and up to green resistance clips for pinching with fair movement pattern and no pain. Verbal and tactile cuing provided for positioning and technique throughout session.   PERFORMANCE DEFICITS: in functional skills including ADLs, IADLs, coordination, dexterity, proprioception, edema, ROM, strength, pain, fascial restrictions, Fine motor control, Gross motor control, and UE functional use    PLAN:  OT FREQUENCY: 2x/week  OT DURATION: 8 weeks  PLANNED INTERVENTIONS: 97168 OT Re-evaluation, 97535 self care/ADL training, 16109 therapeutic exercise, 97530 therapeutic activity, 97140 manual therapy, 97035 ultrasound, 97018 paraffin, 60454 moist heat, 97010 cryotherapy, 97032 electrical stimulation (manual), 97014 electrical stimulation unattended, 97760 Splinting (initial encounter), and DME and/or AE instructions  CONSULTED AND AGREED WITH PLAN OF CARE: Patient  PLAN FOR NEXT SESSION: retrograde massage and manual  techniques, A/ROM, functional reaching. Follow up on HEP; adjust flexion glove as able. Begin session with paraffin to hand; continue with scrub and carry   Trish Mage, OTR/L [phone] 07/02/2023, 3:50 PM

## 2023-07-06 ENCOUNTER — Ambulatory Visit (HOSPITAL_COMMUNITY): Payer: No Typology Code available for payment source | Admitting: Occupational Therapy

## 2023-07-06 ENCOUNTER — Encounter (HOSPITAL_COMMUNITY): Payer: Self-pay | Admitting: Occupational Therapy

## 2023-07-06 DIAGNOSIS — M25641 Stiffness of right hand, not elsewhere classified: Secondary | ICD-10-CM

## 2023-07-06 DIAGNOSIS — M25511 Pain in right shoulder: Secondary | ICD-10-CM

## 2023-07-06 DIAGNOSIS — R29898 Other symptoms and signs involving the musculoskeletal system: Secondary | ICD-10-CM

## 2023-07-06 DIAGNOSIS — M25611 Stiffness of right shoulder, not elsewhere classified: Secondary | ICD-10-CM

## 2023-07-06 DIAGNOSIS — M25621 Stiffness of right elbow, not elsewhere classified: Secondary | ICD-10-CM

## 2023-07-06 NOTE — Therapy (Signed)
OUTPATIENT OCCUPATIONAL THERAPY ORTHO TREATMENT   Patient Name: Stephanie Ramos MRN: [id number] DOB:1945-09-09, 78 y.o., female Today's Date: 07/06/2023    END OF SESSION:  OT End of Session - 07/06/23 1419     Visit Number 29    Number of Visits 37    Date for OT Re-Evaluation 08/07/23    Authorization Type [employer], copay: $15    Authorization Time Period no auth required    OT Start Time 1349    OT Stop Time 1429    OT Time Calculation (min) 40 min    Activity Tolerance Patient tolerated treatment well    Behavior During Therapy [facility] for tasks assessed/performed              Past Medical History:  Diagnosis Date   High iron content of liver determined by magnetic resonance imaging 08/02/2021   Hyperlipemia    Past Surgical History:  Procedure Laterality Date   BREAST SURGERY     CATARACT EXTRACTION W/PHACO Left 08/11/2019   Procedure: CATARACT EXTRACTION PHACO AND INTRAOCULAR LENS PLACEMENT (IOC) (CDE: 9.68);  Surgeon: [name], MD;  Location: [facility];  Service: Ophthalmology;  Laterality: Left;   CATARACT EXTRACTION W/PHACO Right 09/29/2019   Procedure: CATARACT EXTRACTION PHACO AND INTRAOCULAR LENS PLACEMENT RIGHT EYE;  Surgeon: [name], MD;  Location: [facility];  Service: Ophthalmology;  Laterality: Right;  CDE: 10.60   DILITATION & CURRETTAGE/HYSTROSCOPY WITH ESSURE     Patient Active Problem List   Diagnosis Date Noted   Comminuted fracture of right humerus 03/29/2023   Preventative health care 03/10/2022   Hypercalcemia 09/08/2021   High iron content of liver determined by magnetic resonance imaging 08/02/2021   Hyperkalemia 07/21/2021   Elevated ferritin level 06/24/2021   Fatigue 05/20/2021   Iron deficiency 01/18/2021   Elevated BP without diagnosis of hypertension 01/18/2021   Overweight with body mass index (BMI) of 26 to 26.9 in adult 03/18/2020   Refused influenza vaccine 03/18/2020   Vertigo 12/28/2017   Hyperlipemia 12/28/2017    PCP: Dr. Christel Mormon REFERRING PROVIDER: Dr. Teryl Lucy  ONSET DATE: 02/15/23  REFERRING DIAG: rt prox humerus fx with regional pain syndrome of right upper etremity   THERAPY DIAG:  Acute pain of right shoulder  Stiffness of right shoulder, not elsewhere classified  Stiffness of right hand, not elsewhere classified  Stiffness of right elbow, not elsewhere classified  Other symptoms and signs involving the musculoskeletal system  Rationale for Evaluation and Treatment: Rehabilitation  SUBJECTIVE:   SUBJECTIVE STATEMENT: S: "I've been doing the scrubbing five times a day."   PERTINENT HISTORY: Pt is a 78 y/o female presenting s/p right proximal humerus fracture after falling when cleaning up in the woods on 02/15/23. Pt attended therapy at [facility] approximately 4 times before transferring to this clinic.   PRECAUTIONS: Shoulder  WEIGHT BEARING RESTRICTIONS: Yes NWB  PAIN:  Are you having pain? No  FALLS: Has patient fallen in last 6 months? Yes. Number of falls 1  PLOF: Independent  PATIENT GOALS: To get the right arm moving again.   NEXT MD VISIT: 6 weeks from 06/11/23  OBJECTIVE:  Note: Objective measures were completed at Evaluation unless otherwise noted.  HAND DOMINANCE: Right  ADLs: Overall ADLs: Pt is unable to use the RUE for ADLs. She can hold paper plate between her fingers. She is unable to make a fist or use her hand and arm. Pt is using the LUE for all tasks, using hemi-dressing techniques  and strategies for independence.    FUNCTIONAL OUTCOME MEASURES: Quick Dash: 72.73 06/08/23: 18.18  UPPER EXTREMITY ROM:       Assessed in sitting, er/IR adducted  Passive ROM Right eval Right 05/07/23 Right 06/08/23  Shoulder flexion 61 84 88  Shoulder abduction 54 66 77  Shoulder internal rotation 90 90 90  Shoulder external rotation 19 28 28   Elbow flexion 136 145 145  Elbow extension -30 -5 -5  Wrist flexion 55 70 75  Wrist extension 8 22  48  Wrist ulnar deviation 25    Wrist radial deviation 8 11 15   Wrist pronation 85 90   Wrist supination 85 90   (Blank rows = not tested)   Assessed in sitting, er/IR adducted  Active ROM Right eval Right 05/07/23 Right 06/08/23  Shoulder flexion 33 68 81  Shoulder abduction 41 56 71  Shoulder internal rotation 90 90 90  Shoulder external rotation 20 34 35  Elbow flexion 128 142 146  Elbow extension -45 -10 -10  Wrist flexion 55 60 62  Wrist extension 8 12 30   Wrist ulnar deviation 25    Wrist radial deviation 4 5 8   Wrist pronation 70 90 90  Wrist supination 80 88 88  (Blank rows = not tested)      Active ROM Right 05/07/23 Right 06/08/23  Thumb MCP (0-60) 34 36  Thumb IP (0-80) 12 26  Index MCP (0-90) 50 50  Index PIP (0-100) 52 64  Index DIP (0-70)  12 20  Long MCP (0-90)  42 48  Long PIP (0-100)  40 52  Long DIP (0-70)  0 0  Ring MCP (0-90)  30 44  Ring PIP (0-100)  34 54  Ring DIP (0-70)  0 0  Little MCP (0-90)  32 32  Little PIP (0-100)  36 52  Little DIP (0-70)  0 0  (Blank rows = not tested)  UPPER EXTREMITY MMT:       Unable to assess due to pain, limited active movement   12/2: shoulder evaluated on observation; MMT for elbow and wrist  MMT Right eval Right 06/08/23  Shoulder flexion 3-/5 3-/5  Shoulder abduction 3-/5 3-/5  Shoulder internal rotation 3/5 3/5  Shoulder external rotation 3/5 3/5  Elbow flexion 4/5 4+/5  Elbow extension 4-/5 4-/5  Wrist flexion 3+/5 4/5  Wrist extension 3+/5 4-/5  Wrist ulnar deviation 4/5 4/5  Wrist radial deviation 3+/5 4/5  Wrist pronation 4-/5 4+/5  Wrist supination 4-/5 4+/5  (Blank rows = not tested)  HAND FUNCTION: Unable, TBD   COORDINATION: Unable, TBD  SENSATION: WFL  EDEMA:  -Right  -Left Upper arm    32cm 30.5cm Elbow           29cm 24cm Mid-forearm 20cm 20.5cm Wrist            17cm 14.5cm Palm            18.5cm 17cm  05/07/23 -Right   Upper arm    31cm  Elbow            25.5cm Mid-forearm 20cm  Wrist            15.5cm  Palm            17.5cm   06/08/23 -Right   Upper arm    30.5cm  Elbow           26.5cm Mid-forearm  20.75cm  Wrist  15.5cm  Palm            17.25cm  OBSERVATIONS: pt with generalized edema throughout RUE; joint stiffness in digits, wrist, elbow, and shoulder. Consistent with possibility of developing CRPS noting dx of regional pain syndrome of RUE   TODAY'S TREATMENT:                                                                                                                              DATE:  07/06/23 -Scrub and carry: 3' scrub using towel roll followed by 3' carry with 8# weight -Digit A/ROM: composite flexion, abduction/adduction, opposition, x10 -Sponges: 10, 9, 10 -Theraputty: flatten in standing, using pvc pipe to grasp and push into putty making circles-focusing on grip strength and wrist ROM as well as sustained grasp, rolling, gripping pronated  07/02/23 -Paraffin: right hand, 10'  -P/ROM: passive stretching to right hand and digits, 5 reps -Digit ROM: composite flexion, abduction/adduction, opposition, x10 -Digit Blocking: each digit at MCP, PIP, and DIP, x5 -Gripper: 7# picking up 8 medium beads, attempted 11#, unable to fully close -Digiflex: 3#, full squeeze x10, each finger squeeze x10 -Pinch strengthening: red resistance clip, green resistance clip, picking up and stacking 6 cubes  06/28/23 -Pulleys: flexion, abduction, x60" -A/ROM: standing-flexion, abduction, protraction, horizontal abduction, er/IR, x15 -X to V arms, x10 -Goal Post Arms, x10 -Scapular Strengthening: red band, extension, retraction, rows, x15 -Paraffin: right hand, 10'  -P/ROM: passive stretching to right hand and digits, 5 reps -Digit ROM: composite flexion, abduction/adduction, opposition, x10 -Digit Blocking: each digit at MCP, PIP, and DIP, x5    PATIENT EDUCATION: Education details: Continue HEP Person educated:  Patient Education method: Programmer, multimedia, Demonstration, Tactile cues, and Verbal cues Education comprehension: verbalized understanding and returned demonstration  HOME EXERCISE PROGRAM: Eval: Provided shoulder length edema glove/sleeve, educated on donning/doffing, wear and care; continue table slides from [facility] therapist 11/8: AA/ROM in supine 11/15: wrist stretch into extension  11/27: flexion glove wear 12/9: Wall Climbs 12/11: Towel Roll gripping and wringing out 12/20: A/ROM 12/30: shoulder stretch at wall  06/12/23: grasping and manipulating items such as cotton balls 06/20/23: scrub and carry protocol 1/23: Self Digit Blocking  GOALS: Goals reviewed with patient? Yes  SHORT TERM GOALS: Target date: 05/09/23  Pt will be provided with and educated on HEP to improve RUE functioning required for use during ADLs.   Goal status: IN PROGRESS  2.  Pt will increase P/ROM of right shoulder by 50+ degrees to improve ability to perform dressing and bathing tasks independently.   Goal status: IN PROGRESS  3.  Pt will increase A/ROM of right elbow to full extension to improve ability to perform low level functional reaching tasks.   Goal status: IN PROGRESS  4.  Pt will decrease edema in RUE by 50% or greater to improve mobility in RUE required for active participation in bathing and grooming tasks.   Goal status: MET  5.  Pt will  demonstrate ability to make at least 50% of a grasp to promote use when grasping large items such as a cup or water bottle.   Goal status: IN PROGRESS   LONG TERM GOALS: Target date: 06/09/23  Pt will decrease pain in the RUE to 2/10 or less to improve ability to sleep for 3+ consecutive hours without waking due to pain.   Goal status: IN PROGRESS  2.  Pt will increase RUE A/ROM to at least 120 degrees shoulder flexion/abduction, and wrist extension by 25+ degrees to improve use of RUE during meal preparation tasks.   Goal status: IN  PROGRESS  3.  Pt will increase RUE strength to 4-/5 or greater to improve ability to use RUE during housework tasks.   Goal status: IN PROGRESS  4.  Pt will increase grip strength to at least 20# and pinch strength to at least 5# to improve ability to grasp and carry groceries.  Baseline:  Goal status: IN PROGRESS  5.  Pt will decrease RUE fascial restrictions to min amounts or less to improve ability to perform functional reaching tasks.   Goal status: IN PROGRESS   ASSESSMENT:   CLINICAL IMPRESSION: Began session with scrub and carry for proprioceptive and postural input, pt reports she is completing at home 5x/day as well. Continued with 8# weight during carry portion. Pt completing A/ROM of hand, cuing to touch to tips of fingers and not sides of fingers during opposition. Theraputty work for improved functional grasp and coordination, also incorporating ROM. Pt Verbal and tactile cuing provided for positioning and technique throughout session. Pt with good activity tolerance, no reports of pain during session. Mod difficulty with grasp greater than 50%.   PERFORMANCE DEFICITS: in functional skills including ADLs, IADLs, coordination, dexterity, proprioception, edema, ROM, strength, pain, fascial restrictions, Fine motor control, Gross motor control, and UE functional use    PLAN:  OT FREQUENCY: 2x/week  OT DURATION: 8 weeks  PLANNED INTERVENTIONS: 97168 OT Re-evaluation, 97535 self care/ADL training, 29562 therapeutic exercise, 97530 therapeutic activity, 97140 manual therapy, 97035 ultrasound, 97018 paraffin, 13086 moist heat, 97010 cryotherapy, 97032 electrical stimulation (manual), 97014 electrical stimulation unattended, 97760 Splinting (initial encounter), and DME and/or AE instructions  CONSULTED AND AGREED WITH PLAN OF CARE: Patient  PLAN FOR NEXT SESSION: retrograde massage and manual techniques, A/ROM, functional reaching. Follow up on HEP; adjust flexion glove as  able. Paraffin prn; continue with scrub and carry   Ezra Sites, OTR/L  [phone] 07/06/2023, 2:31 PM

## 2023-07-09 ENCOUNTER — Ambulatory Visit (HOSPITAL_COMMUNITY): Payer: No Typology Code available for payment source | Attending: Orthopedic Surgery | Admitting: Occupational Therapy

## 2023-07-09 ENCOUNTER — Encounter (HOSPITAL_COMMUNITY): Payer: Self-pay | Admitting: Occupational Therapy

## 2023-07-09 DIAGNOSIS — R29898 Other symptoms and signs involving the musculoskeletal system: Secondary | ICD-10-CM | POA: Diagnosis not present

## 2023-07-09 DIAGNOSIS — M25621 Stiffness of right elbow, not elsewhere classified: Secondary | ICD-10-CM | POA: Insufficient documentation

## 2023-07-09 DIAGNOSIS — M25511 Pain in right shoulder: Secondary | ICD-10-CM | POA: Insufficient documentation

## 2023-07-09 DIAGNOSIS — M25611 Stiffness of right shoulder, not elsewhere classified: Secondary | ICD-10-CM | POA: Diagnosis not present

## 2023-07-09 DIAGNOSIS — M25641 Stiffness of right hand, not elsewhere classified: Secondary | ICD-10-CM | POA: Diagnosis not present

## 2023-07-09 NOTE — Therapy (Signed)
OUTPATIENT OCCUPATIONAL THERAPY ORTHO TREATMENT   Patient Name: Stephanie Ramos MRN: [id number] DOB:April 10, 1946, 78 y.o., female Today's Date: 07/09/2023    END OF SESSION:  OT End of Session - 07/09/23 1431     Visit Number 30    Number of Visits 37    Date for OT Re-Evaluation 08/07/23    Authorization Type [employer], copay: $15    Authorization Time Period no auth required    OT Start Time 1355    OT Stop Time 1433    OT Time Calculation (min) 38 min    Activity Tolerance Patient tolerated treatment well    Behavior During Therapy WFL for tasks assessed/performed               Past Medical History:  Diagnosis Date   High iron content of liver determined by magnetic resonance imaging 08/02/2021   Hyperlipemia    Past Surgical History:  Procedure Laterality Date   BREAST SURGERY     CATARACT EXTRACTION W/PHACO Left 08/11/2019   Procedure: CATARACT EXTRACTION PHACO AND INTRAOCULAR LENS PLACEMENT (IOC) (CDE: 9.68);  Surgeon: [name], MD;  Location: [facility];  Service: Ophthalmology;  Laterality: Left;   CATARACT EXTRACTION W/PHACO Right 09/29/2019   Procedure: CATARACT EXTRACTION PHACO AND INTRAOCULAR LENS PLACEMENT RIGHT EYE;  Surgeon: [name], MD;  Location: [facility];  Service: Ophthalmology;  Laterality: Right;  CDE: 10.60   DILITATION & CURRETTAGE/HYSTROSCOPY WITH ESSURE     Patient Active Problem List   Diagnosis Date Noted   Comminuted fracture of right humerus 03/29/2023   Preventative health care 03/10/2022   Hypercalcemia 09/08/2021   High iron content of liver determined by magnetic resonance imaging 08/02/2021   Hyperkalemia 07/21/2021   Elevated ferritin level 06/24/2021   Fatigue 05/20/2021   Iron deficiency 01/18/2021   Elevated BP without diagnosis of hypertension 01/18/2021   Overweight with body mass index (BMI) of 26 to 26.9 in adult 03/18/2020   Refused influenza vaccine 03/18/2020   Vertigo 12/28/2017   Hyperlipemia  12/28/2017   PCP: Dr. Christel Mormon REFERRING PROVIDER: Dr. Teryl Lucy  ONSET DATE: 02/15/23  REFERRING DIAG: rt prox humerus fx with regional pain syndrome of right upper etremity   THERAPY DIAG:  Acute pain of right shoulder  Stiffness of right shoulder, not elsewhere classified  Stiffness of right hand, not elsewhere classified  Stiffness of right elbow, not elsewhere classified  Other symptoms and signs involving the musculoskeletal system  Rationale for Evaluation and Treatment: Rehabilitation  SUBJECTIVE:   SUBJECTIVE STATEMENT: S: "I can move this ring finger more"   PERTINENT HISTORY: Pt is a 78 y/o female presenting s/p right proximal humerus fracture after falling when [place] the woods on 02/15/23. Pt attended therapy at [facility] approximately 4 times before transferring to this clinic.   PRECAUTIONS: Shoulder  WEIGHT BEARING RESTRICTIONS: Yes NWB  PAIN:  Are you having pain? No  FALLS: Has patient fallen in last 6 months? Yes. Number of falls 1  PLOF: Independent  PATIENT GOALS: To get the right arm moving again.   NEXT MD VISIT: 6 weeks from 06/11/23  OBJECTIVE:  Note: Objective measures were completed at Evaluation unless otherwise noted.  HAND DOMINANCE: Right  ADLs: Overall ADLs: Pt is unable to use the RUE for ADLs. She can hold paper plate between her fingers. She is unable to make a fist or use her hand and arm. Pt is using the LUE for all tasks, using hemi-dressing techniques and  strategies for independence.    FUNCTIONAL OUTCOME MEASURES: Quick Dash: 72.73 06/08/23: 18.18  UPPER EXTREMITY ROM:       Assessed in sitting, er/IR adducted  Passive ROM Right eval Right 05/07/23 Right 06/08/23  Shoulder flexion 61 84 88  Shoulder abduction 54 66 77  Shoulder internal rotation 90 90 90  Shoulder external rotation 19 28 28   Elbow flexion 136 145 145  Elbow extension -30 -5 -5  Wrist flexion 55 70 75  Wrist extension 8 22 48   Wrist ulnar deviation 25    Wrist radial deviation 8 11 15   Wrist pronation 85 90   Wrist supination 85 90   (Blank rows = not tested)   Assessed in sitting, er/IR adducted  Active ROM Right eval Right 05/07/23 Right 06/08/23  Shoulder flexion 33 68 81  Shoulder abduction 41 56 71  Shoulder internal rotation 90 90 90  Shoulder external rotation 20 34 35  Elbow flexion 128 142 146  Elbow extension -45 -10 -10  Wrist flexion 55 60 62  Wrist extension 8 12 30   Wrist ulnar deviation 25    Wrist radial deviation 4 5 8   Wrist pronation 70 90 90  Wrist supination 80 88 88  (Blank rows = not tested)      Active ROM Right 05/07/23 Right 06/08/23  Thumb MCP (0-60) 34 36  Thumb IP (0-80) 12 26  Index MCP (0-90) 50 50  Index PIP (0-100) 52 64  Index DIP (0-70)  12 20  Long MCP (0-90)  42 48  Long PIP (0-100)  40 52  Long DIP (0-70)  0 0  Ring MCP (0-90)  30 44  Ring PIP (0-100)  34 54  Ring DIP (0-70)  0 0  Little MCP (0-90)  32 32  Little PIP (0-100)  36 52  Little DIP (0-70)  0 0  (Blank rows = not tested)  UPPER EXTREMITY MMT:       Unable to assess due to pain, limited active movement   12/2: shoulder evaluated on observation; MMT for elbow and wrist  MMT Right eval Right 06/08/23  Shoulder flexion 3-/5 3-/5  Shoulder abduction 3-/5 3-/5  Shoulder internal rotation 3/5 3/5  Shoulder external rotation 3/5 3/5  Elbow flexion 4/5 4+/5  Elbow extension 4-/5 4-/5  Wrist flexion 3+/5 4/5  Wrist extension 3+/5 4-/5  Wrist ulnar deviation 4/5 4/5  Wrist radial deviation 3+/5 4/5  Wrist pronation 4-/5 4+/5  Wrist supination 4-/5 4+/5  (Blank rows = not tested)  HAND FUNCTION: Unable, TBD   COORDINATION: Unable, TBD  SENSATION: WFL  EDEMA:  -Right  -Left Upper arm    32cm 30.5cm Elbow           29cm 24cm Mid-forearm 20cm 20.5cm Wrist            17cm 14.5cm Palm            18.5cm 17cm  05/07/23 -Right   Upper arm    31cm  Elbow            25.5cm Mid-forearm 20cm  Wrist            15.5cm  Palm            17.5cm   06/08/23 -Right   Upper arm    30.5cm  Elbow           26.5cm Mid-forearm  20.75cm  Wrist  15.5cm  Palm            17.25cm  OBSERVATIONS: pt with generalized edema throughout RUE; joint stiffness in digits, wrist, elbow, and shoulder. Consistent with possibility of developing CRPS noting dx of regional pain syndrome of RUE   TODAY'S TREATMENT:                                                                                                                              DATE:  07/09/23 -Scrub and carry: 3' scrub using towel roll followed by 3' carry with 8# weight -Digit A/ROM: composite flexion, opposition, 10 reps -Sponges: 12, 14, 18 -Gripper: large and medium beads with gripper vertical at 15# -Cards: pt holding 1/2 deck of cards in right palm, using thumb to push the top card of the pile while holding remaining deck with digits, working on grasp and in-hand manipulation. Min difficulty with task, increased time to prevent dropping cards -Coin manipulation: pt holding Sequence chips in palm and working on translating to fingertips to then place on towel. Mod difficulty to refrain from shaking the chips forward. Pt requiring max cuing not to shake and to use her fingers and thumb. 5 rounds completed.   07/06/23 -Scrub and carry: 3' scrub using towel roll followed by 3' carry with 8# weight -Digit A/ROM: composite flexion, abduction/adduction, opposition, x10 -Sponges: 10, 9, 10 -Theraputty: flatten in standing, using pvc pipe to grasp and push into putty making circles-focusing on grip strength and wrist ROM as well as sustained grasp, rolling, gripping pronated  07/02/23 -Paraffin: right hand, 10'  -P/ROM: passive stretching to right hand and digits, 5 reps -Digit ROM: composite flexion, abduction/adduction, opposition, x10 -Digit Blocking: each digit at MCP, PIP, and DIP, x5 -Gripper: 7# picking up 8  medium beads, attempted 11#, unable to fully close -Digiflex: 3#, full squeeze x10, each finger squeeze x10 -Pinch strengthening: red resistance clip, green resistance clip, picking up and stacking 6 cubes    PATIENT EDUCATION: Education details: Continue HEP Person educated: Patient Education method: Programmer, multimedia, Demonstration, Tactile cues, and Verbal cues Education comprehension: verbalized understanding and returned demonstration  HOME EXERCISE PROGRAM: Eval: Provided shoulder length edema glove/sleeve, educated on donning/doffing, wear and care; continue table slides from [facility] therapist 11/8: AA/ROM in supine 11/15: wrist stretch into extension  11/27: flexion glove wear 12/9: Wall Climbs 12/11: Towel Roll gripping and wringing out 12/20: A/ROM 12/30: shoulder stretch at wall  06/12/23: grasping and manipulating items such as cotton balls 06/20/23: scrub and carry protocol 1/23: Self Digit Blocking  GOALS: Goals reviewed with patient? Yes  SHORT TERM GOALS: Target date: 05/09/23  Pt will be provided with and educated on HEP to improve RUE functioning required for use during ADLs.   Goal status: IN PROGRESS  2.  Pt will increase P/ROM of right shoulder by 50+ degrees to improve ability to perform dressing and bathing tasks independently.   Goal status: IN PROGRESS  3.  Pt will increase A/ROM of right elbow to full extension to improve ability to perform low level functional reaching tasks.   Goal status: IN PROGRESS  4.  Pt will decrease edema in RUE by 50% or greater to improve mobility in RUE required for active participation in bathing and grooming tasks.   Goal status: MET  5.  Pt will demonstrate ability to make at least 50% of a grasp to promote use when grasping large items such as a cup or water bottle.   Goal status: IN PROGRESS   LONG TERM GOALS: Target date: 06/09/23  Pt will decrease pain in the RUE to 2/10 or less to improve ability to sleep  for 3+ consecutive hours without waking due to pain.   Goal status: IN PROGRESS  2.  Pt will increase RUE A/ROM to at least 120 degrees shoulder flexion/abduction, and wrist extension by 25+ degrees to improve use of RUE during meal preparation tasks.   Goal status: IN PROGRESS  3.  Pt will increase RUE strength to 4-/5 or greater to improve ability to use RUE during housework tasks.   Goal status: IN PROGRESS  4.  Pt will increase grip strength to at least 20# and pinch strength to at least 5# to improve ability to grasp and carry groceries.  Baseline:  Goal status: IN PROGRESS  5.  Pt will decrease RUE fascial restrictions to min amounts or less to improve ability to perform functional reaching tasks.   Goal status: IN PROGRESS   ASSESSMENT:   CLINICAL IMPRESSION: Began session with scrub and carry for proprioceptive and postural input, pt reports she has completed 3x today so far. Also reports she has more mobility in the 4th digit than she has had. Pt completing A/ROM of hand, attempted to touch fingertips instead of sides of fingers but was unable to successfully achieve. Resumed hand gripper activity for functional grasp work and was able to increase resistance to 15# today versus 7# on previous attempt. Rest breaks provided as needed during tasks. Added in-hand manipulation tasks, mod to max difficulty, requiring consistent cuing for technique. Verbal cuing for form and technique throughout tasks.   PERFORMANCE DEFICITS: in functional skills including ADLs, IADLs, coordination, dexterity, proprioception, edema, ROM, strength, pain, fascial restrictions, Fine motor control, Gross motor control, and UE functional use    PLAN:  OT FREQUENCY: 2x/week  OT DURATION: 8 weeks  PLANNED INTERVENTIONS: 97168 OT Re-evaluation, 97535 self care/ADL training, 16109 therapeutic exercise, 97530 therapeutic activity, 97140 manual therapy, 97035 ultrasound, 97018 paraffin, 60454 moist heat,  97010 cryotherapy, 97032 electrical stimulation (manual), 97014 electrical stimulation unattended, 97760 Splinting (initial encounter), and DME and/or AE instructions  CONSULTED AND AGREED WITH PLAN OF CARE: Patient  PLAN FOR NEXT SESSION: retrograde massage and manual techniques, A/ROM, functional reaching. Follow up on HEP; adjust flexion glove as able. Paraffin prn; continue with scrub and carry; PROGRESS NOTE   Ezra Sites, OTR/L  [phone] 07/09/2023, 2:34 PM

## 2023-07-12 IMAGING — MR MR ABDOMEN WO/W CM
27 of 38 series · 36 of 48 positions shown · IV contrast (gadavist)
Comparison: None.

CLINICAL DATA: Elevated ferritin, assess liver iron concentration

EXAM:
MRI ABDOMEN WITHOUT AND WITH CONTRAST
TECHNIQUE: Multiplanar multisequence MR imaging of the abdomen was performed
both before and after the administration of intravenous contrast.
CONTRAST:  6mL GADAVIST GADOBUTROL 1 MMOL/ML IV SOLN

[Series 4: cor haste · coronal · 6.0mm · 1.19mm/px · 2 of 28 slices shown]
[im 1/28]
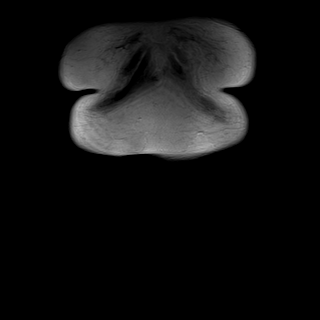
[im 28/28]
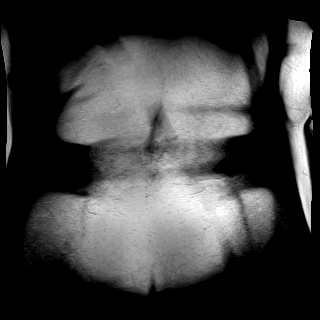

[Series 5: ax haste · axial · 6.0mm · 1.19mm/px · 1 of 32 slices shown]
[im 1/32]
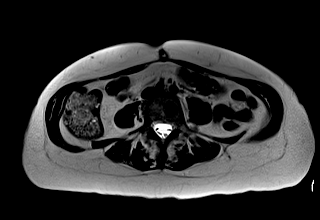

[Series 7: T2 fat-sat · axial · 6.0mm · 1.19mm/px · 1 of 30 slices shown]
[im 1/30]
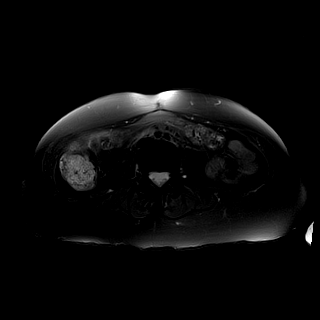

[Series 8: ax in and · axial · 3.0mm · 1.19mm/px · 1 of 72 slices shown (1 of 2)]
[im 1/72]
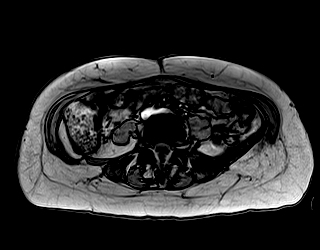

[Series 9: ax in and · axial · 3.0mm · 1.19mm/px · 1 of 72 slices shown (2 of 2)]
[im 1/72]
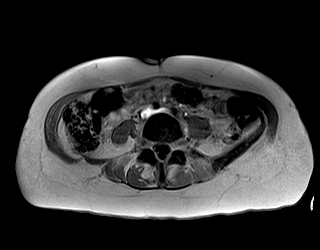

[Series 10: DWI · axial · 6.0mm · 1.42mm/px · 1 of 34 slices shown (1 of 4)]
[im 1/34]
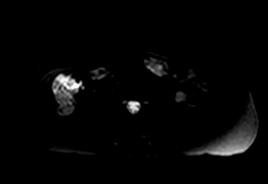

[Series 10: DWI · axial · 6.0mm · 1.42mm/px · 1 of 34 slices shown (2 of 4)]
[im 1/34]
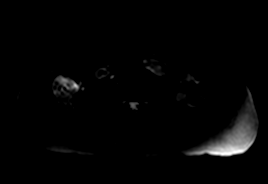

[Series 10: DWI · axial · 6.0mm · 1.42mm/px · 1 of 34 slices shown (3 of 4)]
[im 1/34]
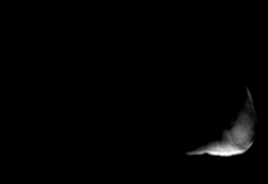

[Series 11: DWI · axial · 6.0mm · 1.42mm/px · 1 of 34 slices shown (4 of 4)]
[im 1/34]
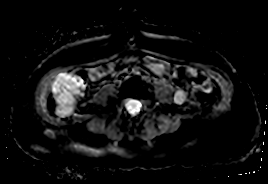

[Series 44: t1_vibe_e-dixon_tra_bh_pre_opp · axial · 3.0mm · 1.19mm/px · 1 of 72 slices shown]
[im 1/72]
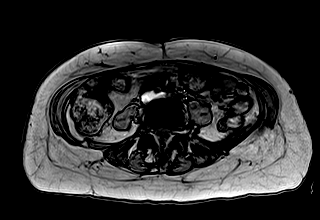

[Series 45: t1_vibe_e-dixon_tra_bh_pre_in · axial · 3.0mm · 1.19mm/px · 1 of 72 slices shown]
[im 1/72]
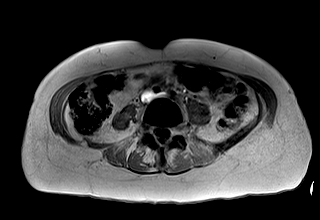

[Series 46: t1_vibe_e-dixon_tra_bh_pre_f · axial · 3.0mm · 1.19mm/px · 1 of 72 slices shown]
[im 1/72]
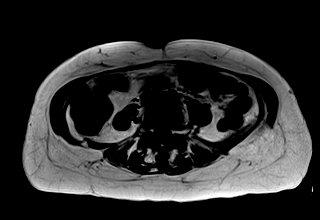

[Series 47: t1_vibe_e-dixon_tra_bh_pre_w · axial · 3.0mm · 1.19mm/px · 1 of 72 slices shown]
[im 1/72]
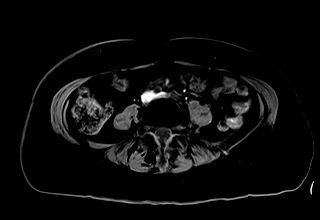

[Series 48: t1_vibe_e-dixon_tra_bh_pre_w_seg · axial · 3.0mm · 1.19mm/px · 2 of 128 slices shown]
[im 1/128]
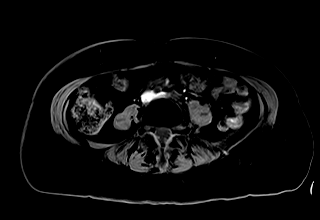
[im 128/128]
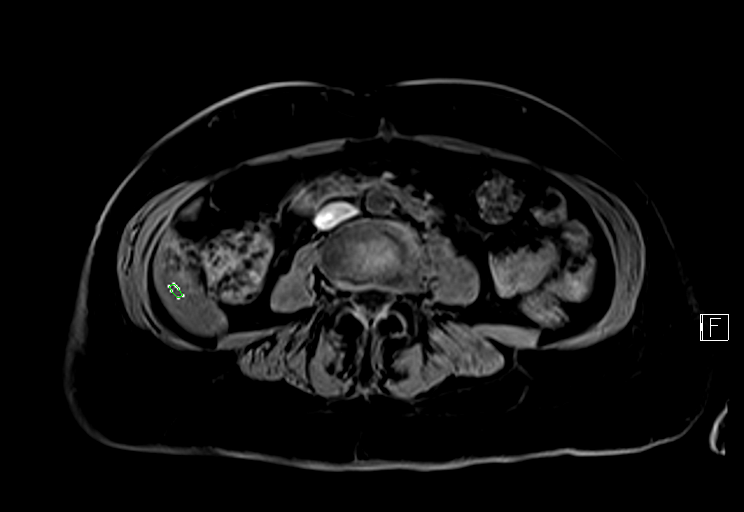

[Series 52: t1_vibe_e-dixon_tra_bh_pre_w_mpr_cor · coronal · 3.0mm · 0.74mm/px · 2 of 87 slices shown]
[im 1/87]
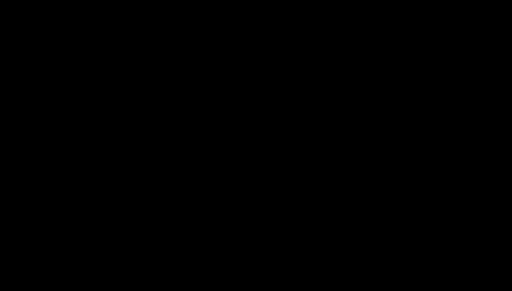
[im 87/87]
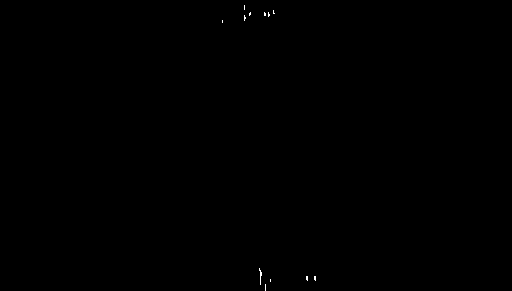

[Series 53: t1_vibe_e-dixon_tra_bh_pre_w_mpr_sag · sagittal · 3.0mm · 0.51mm/px · 2 of 127 slices shown]
[im 1/127]
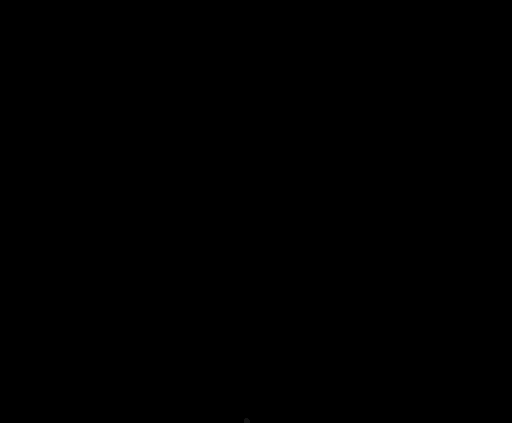
[im 127/127]
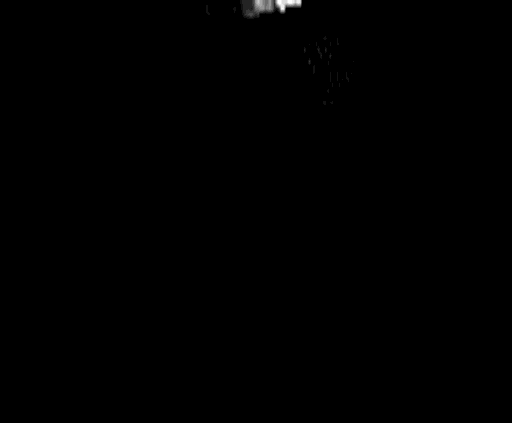

[Series 54: t1_vibe_e-dixon_tra_bh_pre_w_mpr_cor_nd · coronal · 3.0mm · 0.74mm/px · 2 of 87 slices shown]
[im 1/87]
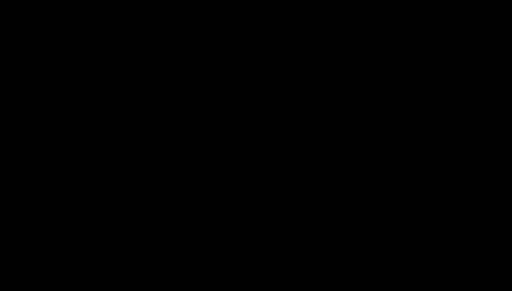
[im 87/87]
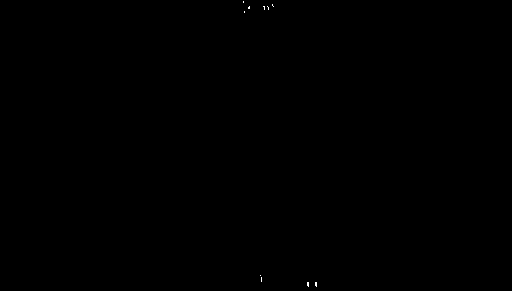

[Series 55: t1_vibe_e-dixon_tra_bh_pre_w_mpr_sag_nd · sagittal · 3.0mm · 0.51mm/px · 2 of 127 slices shown]
[im 1/127]
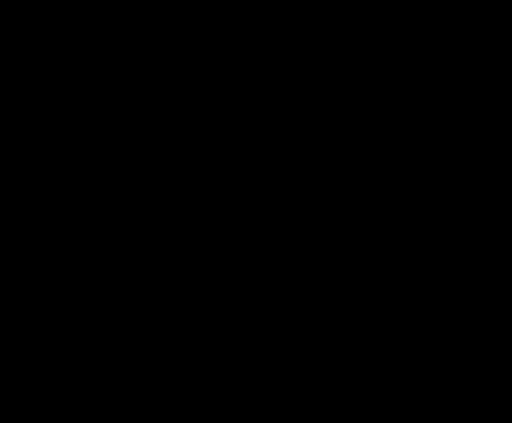
[im 127/127]
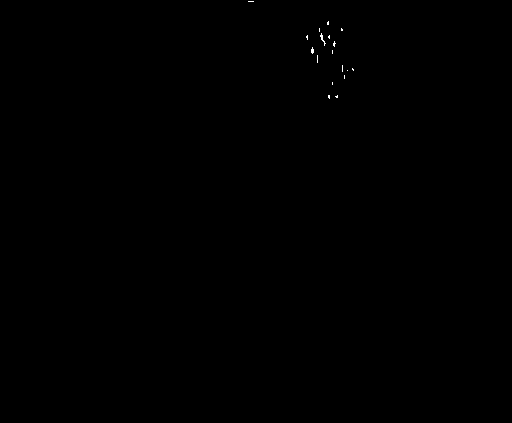

[Series 56: t1_vibe_e-dixon_tra_bh_pre_w_nd · axial · 3.0mm · 1.19mm/px · 1 of 72 slices shown]
[im 1/72]
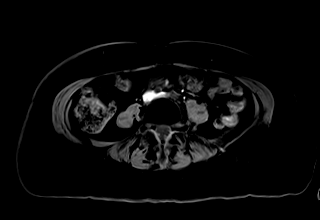

[Series 57: vibe_q-dixon_tra_bh_w · axial · 3.5mm · 1.19mm/px · 1 of 64 slices shown]
[im 1/64]
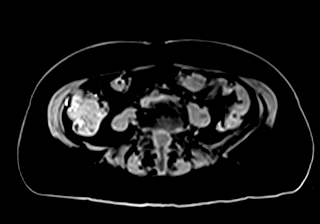

[Series 58: vibe_q-dixon_tra_bh_f · axial · 3.5mm · 1.19mm/px · 1 of 64 slices shown]
[im 1/64]
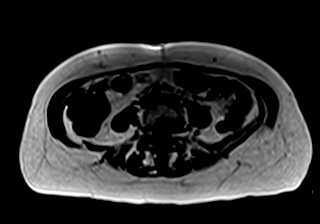

[Series 61: vibe_q-dixon_tra_bh_ff · axial · 3.5mm · 1.19mm/px · 2 of 112 slices shown]
[im 1/112]
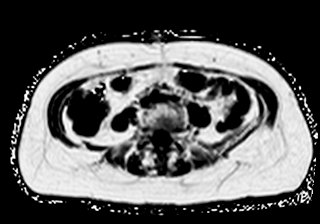
[im 112/112]
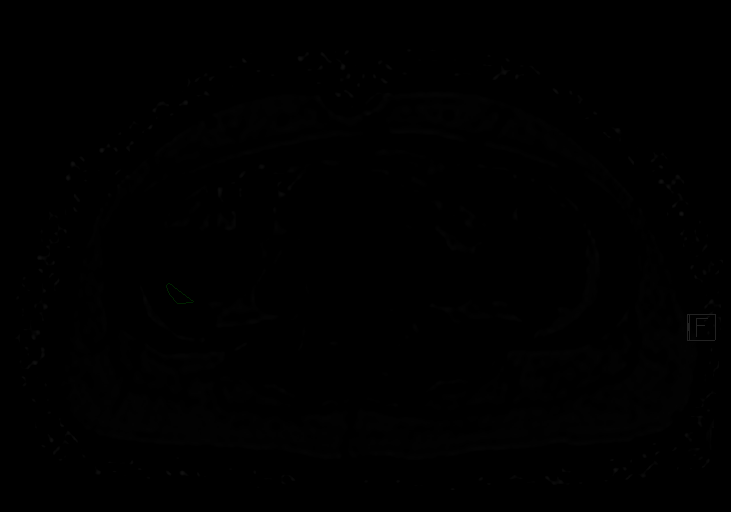

[Series 63: vibe_q-dixon_tra_bh_goodnessoffit · axial · 3.5mm · 1.19mm/px · 2 of 112 slices shown]
[im 1/112]
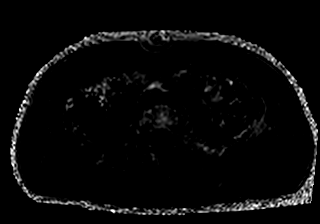
[im 112/112]
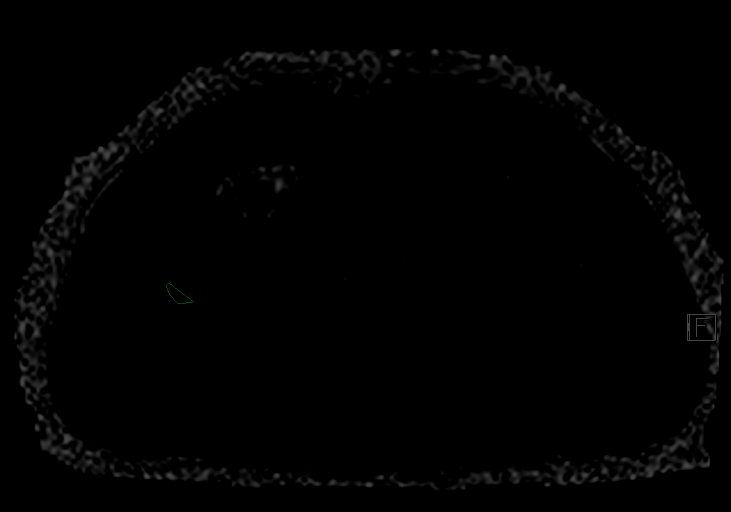

[Series 65: vibe_q-dixon_tra_bh_r2s_eff · axial · 3.5mm · 1.19mm/px · 2 of 112 slices shown]
[im 1/112]
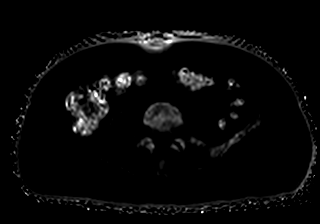
[im 112/112]
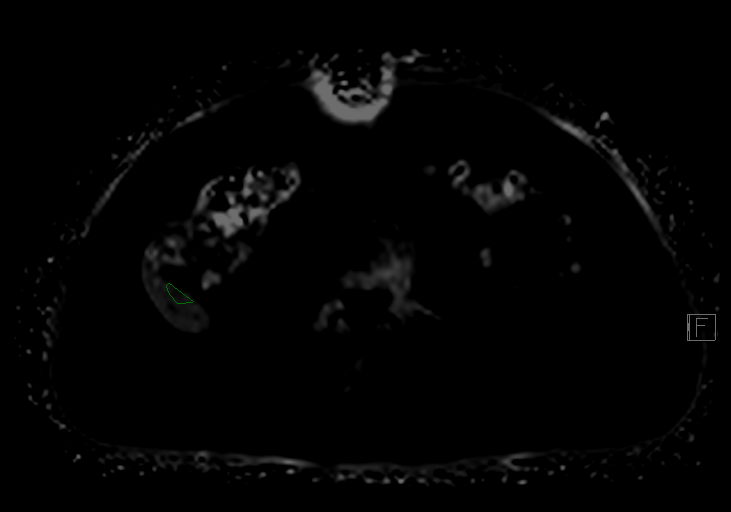

[Series 67: vibe_q-dixon_tra_bh_t2s_eff · axial · 3.5mm · 1.19mm/px · 1 of 64 slices shown]
[im 1/64]
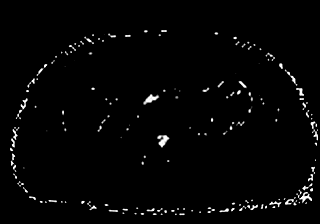

[Series 68: vibe_q-dixon_tra_bh_wf · axial · 3.5mm · 1.19mm/px · 1 of 64 slices shown]
[im 1/64]
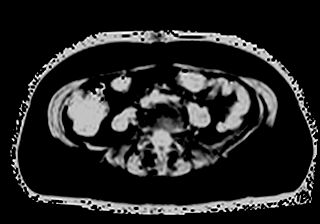

[Series 69: vibe_q-dixon_tra_bh_mpr_cor · coronal · 3.5mm · 0.74mm/px · 1 of 74 slices shown]
[im 1/74]
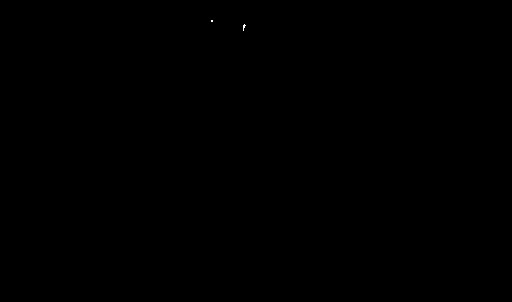

[36 of 48 positions shown; findings below may reference images not displayed]

FINDINGS: Lower chest: No acute findings.

Hepatobiliary: Hepatic parenchymal signal inversion on in and out of
phase sequences. No mass or other parenchymal abnormality
identified. Small gallstones and or sludge in the gallbladder. No
biliary ductal dilatation.

Pancreas: No mass, inflammatory changes, or other parenchymal
abnormality identified.No pancreatic ductal dilatation.

Spleen: Within normal limits in size. Signal loss on out of phase
imaging.

Adrenals/Urinary Tract: Normal adrenal glands. No renal masses or
suspicious contrast enhancement identified. No evidence of
hydronephrosis.

Stomach/Bowel: Visualized portions within the abdomen are
unremarkable.

Vascular/Lymphatic: No pathologically enlarged lymph nodes
identified. No abdominal aortic aneurysm demonstrated.

Other:  None.

Musculoskeletal: No suspicious osseous lesions identified. Mild
marrow signal loss on out of phase imaging.
IMPRESSION: 1. Hepatic parenchymal signal inversion on in and out of phase
sequences, consistent with hepatic iron deposition. There is
additionally splenic and bone marrow signal loss suggesting
reticuloendothelial iron deposition. Technical note: Quantitative
sequences for determination of liver iron concentration were not
provided for review.
2. Cholelithiasis.

## 2023-07-13 ENCOUNTER — Ambulatory Visit (HOSPITAL_COMMUNITY): Payer: No Typology Code available for payment source | Admitting: Occupational Therapy

## 2023-07-13 ENCOUNTER — Encounter (HOSPITAL_COMMUNITY): Payer: Self-pay | Admitting: Occupational Therapy

## 2023-07-13 DIAGNOSIS — M25641 Stiffness of right hand, not elsewhere classified: Secondary | ICD-10-CM

## 2023-07-13 DIAGNOSIS — M25611 Stiffness of right shoulder, not elsewhere classified: Secondary | ICD-10-CM

## 2023-07-13 DIAGNOSIS — M25511 Pain in right shoulder: Secondary | ICD-10-CM | POA: Diagnosis not present

## 2023-07-13 DIAGNOSIS — R29898 Other symptoms and signs involving the musculoskeletal system: Secondary | ICD-10-CM

## 2023-07-13 NOTE — Therapy (Signed)
 OUTPATIENT OCCUPATIONAL THERAPY ORTHO TREATMENT   Patient Name: Stephanie Ramos MRN: [id number] DOB:1946/01/21, 78 y.o., female Today's Date: 07/13/2023    END OF SESSION:  OT End of Session - 07/13/23 1431     Visit Number 31    Number of Visits 37    Date for OT Re-Evaluation 08/07/23    Authorization Type [employer], copay: $15    Authorization Time Period no auth required    OT Start Time 1351    OT Stop Time 1431    OT Time Calculation (min) 40 min    Activity Tolerance Patient tolerated treatment well    Behavior During Therapy WFL for tasks assessed/performed                Past Medical History:  Diagnosis Date   High iron content of liver determined by magnetic resonance imaging 08/02/2021   Hyperlipemia    Past Surgical History:  Procedure Laterality Date   BREAST SURGERY     CATARACT EXTRACTION W/PHACO Left 08/11/2019   Procedure: CATARACT EXTRACTION PHACO AND INTRAOCULAR LENS PLACEMENT (IOC) (CDE: 9.68);  Surgeon: Harrie Agent, MD;  Location: [facility];  Service: Ophthalmology;  Laterality: Left;   CATARACT EXTRACTION W/PHACO Right 09/29/2019   Procedure: CATARACT EXTRACTION PHACO AND INTRAOCULAR LENS PLACEMENT RIGHT EYE;  Surgeon: Harrie Agent, MD;  Location: [facility];  Service: Ophthalmology;  Laterality: Right;  CDE: 10.60   DILITATION & CURRETTAGE/HYSTROSCOPY WITH ESSURE     Patient Active Problem List   Diagnosis Date Noted   Comminuted fracture of right humerus 03/29/2023   Preventative health care 03/10/2022   Hypercalcemia 09/08/2021   High iron content of liver determined by magnetic resonance imaging 08/02/2021   Hyperkalemia 07/21/2021   Elevated ferritin level 06/24/2021   Fatigue 05/20/2021   Iron deficiency 01/18/2021   Elevated BP without diagnosis of hypertension 01/18/2021   Overweight with body mass index (BMI) of 26 to 26.9 in adult 03/18/2020   Refused influenza vaccine 03/18/2020   Vertigo 12/28/2017   Hyperlipemia  12/28/2017   PCP: Dr. Manus Fireman REFERRING PROVIDER: Dr. Fonda Olmsted  ONSET DATE: 02/15/23  REFERRING DIAG: rt prox humerus fx with regional pain syndrome of right upper etremity   THERAPY DIAG:  Acute pain of right shoulder  Stiffness of right shoulder, not elsewhere classified  Stiffness of right hand, not elsewhere classified  Other symptoms and signs involving the musculoskeletal system  Rationale for Evaluation and Treatment: Rehabilitation  SUBJECTIVE:   SUBJECTIVE STATEMENT: S: I can move this ring finger more   PERTINENT HISTORY: Pt is a 78 y/o female presenting s/p right proximal humerus fracture after falling when [place] in the woods on 02/15/23. Pt attended therapy at [facility] approximately 4 times before transferring to this clinic.   PRECAUTIONS: Shoulder  WEIGHT BEARING RESTRICTIONS: Yes NWB  PAIN:  Are you having pain? No  FALLS: Has patient fallen in last 6 months? Yes. Number of falls 1  PLOF: Independent  PATIENT GOALS: To get the right arm moving again.   NEXT MD VISIT: 6 weeks from 06/11/23  OBJECTIVE:  Note: Objective measures were completed at Evaluation unless otherwise noted.  HAND DOMINANCE: Right  ADLs: Overall ADLs: Pt is unable to use the RUE for ADLs. She can hold paper plate between her fingers. She is unable to make a fist or use her hand and arm. Pt is using the LUE for all tasks, using hemi-dressing techniques and strategies for independence.    FUNCTIONAL  OUTCOME MEASURES: Quick Dash: 72.73 06/08/23: 18.18  UPPER EXTREMITY ROM:       Assessed in sitting, er/IR adducted  Passive ROM Right eval Right 05/07/23 Right 06/08/23  Shoulder flexion 61 84 88  Shoulder abduction 54 66 77  Shoulder internal rotation 90 90 90  Shoulder external rotation 19 28 28   Elbow flexion 136 145 145  Elbow extension -30 -5 -5  Wrist flexion 55 70 75  Wrist extension 8 22 48  Wrist ulnar deviation 25    Wrist radial deviation 8  11 15   Wrist pronation 85 90   Wrist supination 85 90   (Blank rows = not tested)   Assessed in sitting, er/IR adducted  Active ROM Right eval Right 05/07/23 Right 06/08/23  Shoulder flexion 33 68 81  Shoulder abduction 41 56 71  Shoulder internal rotation 90 90 90  Shoulder external rotation 20 34 35  Elbow flexion 128 142 146  Elbow extension -45 -10 -10  Wrist flexion 55 60 62  Wrist extension 8 12 30   Wrist ulnar deviation 25    Wrist radial deviation 4 5 8   Wrist pronation 70 90 90  Wrist supination 80 88 88  (Blank rows = not tested)      Active ROM Right 05/07/23 Right 06/08/23  Thumb MCP (0-60) 34 36  Thumb IP (0-80) 12 26  Index MCP (0-90) 50 50  Index PIP (0-100) 52 64  Index DIP (0-70)  12 20  Long MCP (0-90)  42 48  Long PIP (0-100)  40 52  Long DIP (0-70)  0 0  Ring MCP (0-90)  30 44  Ring PIP (0-100)  34 54  Ring DIP (0-70)  0 0  Little MCP (0-90)  32 32  Little PIP (0-100)  36 52  Little DIP (0-70)  0 0  (Blank rows = not tested)  UPPER EXTREMITY MMT:       Unable to assess due to pain, limited active movement   12/2: shoulder evaluated on observation; MMT for elbow and wrist  MMT Right eval Right 06/08/23  Shoulder flexion 3-/5 3-/5  Shoulder abduction 3-/5 3-/5  Shoulder internal rotation 3/5 3/5  Shoulder external rotation 3/5 3/5  Elbow flexion 4/5 4+/5  Elbow extension 4-/5 4-/5  Wrist flexion 3+/5 4/5  Wrist extension 3+/5 4-/5  Wrist ulnar deviation 4/5 4/5  Wrist radial deviation 3+/5 4/5  Wrist pronation 4-/5 4+/5  Wrist supination 4-/5 4+/5  (Blank rows = not tested)  HAND FUNCTION: Unable, TBD   COORDINATION: Unable, TBD  SENSATION: WFL  EDEMA:  -Right  -Left Upper arm    32cm 30.5cm Elbow           29cm 24cm Mid-forearm 20cm 20.5cm Wrist            17cm 14.5cm Palm            18.5cm 17cm  05/07/23 -Right   Upper arm    31cm  Elbow           25.5cm Mid-forearm 20cm  Wrist            15.5cm  Palm             17.5cm   06/08/23 -Right   Upper arm    30.5cm  Elbow           26.5cm Mid-forearm  20.75cm  Wrist            15.5cm  Palm  17.25cm  OBSERVATIONS: pt with generalized edema throughout RUE; joint stiffness in digits, wrist, elbow, and shoulder. Consistent with possibility of developing CRPS noting dx of regional pain syndrome of RUE   TODAY'S TREATMENT:                                                                                                                              DATE:   07/13/23 -Scrub and carry: 3' scrub using towel roll followed by 3' carry with 8# weight, x2 reps -Digit A/ROM: composite flexion, opposition, finger taps 10 reps -Pinch Tree: yellow, red, green, blue resistance clips, placing on vertical pole by reaching up with mod difficulty as she reached head height due to shoulder mobility -nuts and bolts, unscrewing, then holding 2-3 bolts at 1 time, using in hand translation to replace them.  -Gripper: 15# 8 medium beads in vertical, 20# 8 medium beads in horizontal  07/09/23 -Scrub and carry: 3' scrub using towel roll followed by 3' carry with 8# weight -Digit A/ROM: composite flexion, opposition, 10 reps -Sponges: 12, 14, 18 -Gripper: large and medium beads with gripper vertical at 15# -Cards: pt holding 1/2 deck of cards in right palm, using thumb to push the top card of the pile while holding remaining deck with digits, working on grasp and in-hand manipulation. Min difficulty with task, increased time to prevent dropping cards -Coin manipulation: pt holding Sequence chips in palm and working on translating to fingertips to then place on towel. Mod difficulty to refrain from shaking the chips forward. Pt requiring max cuing not to shake and to use her fingers and thumb. 5 rounds completed.   07/06/23 -Scrub and carry: 3' scrub using towel roll followed by 3' carry with 8# weight -Digit A/ROM: composite flexion, abduction/adduction, opposition,  x10 -Sponges: 10, 9, 10 -Theraputty: flatten in standing, using pvc pipe to grasp and push into putty making circles-focusing on grip strength and wrist ROM as well as sustained grasp, rolling, gripping pronated   PATIENT EDUCATION: Education details: Continue HEP Person educated: Patient Education method: Programmer, Multimedia, Demonstration, Tactile cues, and Verbal cues Education comprehension: verbalized understanding and returned demonstration  HOME EXERCISE PROGRAM: Eval: Provided shoulder length edema glove/sleeve, educated on donning/doffing, wear and care; continue table slides from Murphy-Wainer therapist 11/8: AA/ROM in supine 11/15: wrist stretch into extension  11/27: flexion glove wear 12/9: Wall Climbs 12/11: Towel Roll gripping and wringing out 12/20: A/ROM 12/30: shoulder stretch at wall  06/12/23: grasping and manipulating items such as cotton balls 06/20/23: scrub and carry protocol 1/23: Self Digit Blocking  GOALS: Goals reviewed with patient? Yes  SHORT TERM GOALS: Target date: 05/09/23  Pt will be provided with and educated on HEP to improve RUE functioning required for use during ADLs.   Goal status: IN PROGRESS  2.  Pt will increase P/ROM of right shoulder by 50+ degrees to improve ability to perform dressing and bathing tasks independently.   Goal status: IN PROGRESS  3.  Pt will increase A/ROM of right elbow to full extension to improve ability to perform low level functional reaching tasks.   Goal status: IN PROGRESS  4.  Pt will decrease edema in RUE by 50% or greater to improve mobility in RUE required for active participation in bathing and grooming tasks.   Goal status: MET  5.  Pt will demonstrate ability to make at least 50% of a grasp to promote use when grasping large items such as a cup or water bottle.   Goal status: IN PROGRESS   LONG TERM GOALS: Target date: 06/09/23  Pt will decrease pain in the RUE to 2/10 or less to improve ability to  sleep for 3+ consecutive hours without waking due to pain.   Goal status: IN PROGRESS  2.  Pt will increase RUE A/ROM to at least 120 degrees shoulder flexion/abduction, and wrist extension by 25+ degrees to improve use of RUE during meal preparation tasks.   Goal status: IN PROGRESS  3.  Pt will increase RUE strength to 4-/5 or greater to improve ability to use RUE during housework tasks.   Goal status: IN PROGRESS  4.  Pt will increase grip strength to at least 20# and pinch strength to at least 5# to improve ability to grasp and carry groceries.  Baseline:  Goal status: IN PROGRESS  5.  Pt will decrease RUE fascial restrictions to min amounts or less to improve ability to perform functional reaching tasks.   Goal status: IN PROGRESS   ASSESSMENT:   CLINICAL IMPRESSION: This session pt and OT began session with scrub and carry for proprioceptive and postural input, mimicking home chores, pt reports she has completed 3x today so far. She worked on marketing executive, as well this session, combining functional reaching to continue addressing poor shoulder mobility and stiffness. Pt was able to continuing tolerating 15# from gripping and was able to increase up to 20# with horizontal grip. OT also added nuts and bolts this session to address fine motor skills that she can and will use at home during cooking, cleaning, and other home management tasks. Verbal and tactile cuing provided for positioning and technique throughout session.   PERFORMANCE DEFICITS: in functional skills including ADLs, IADLs, coordination, dexterity, proprioception, edema, ROM, strength, pain, fascial restrictions, Fine motor control, Gross motor control, and UE functional use    PLAN:  OT FREQUENCY: 2x/week  OT DURATION: 8 weeks  PLANNED INTERVENTIONS: 97168 OT Re-evaluation, 97535 self care/ADL training, 02889 therapeutic exercise, 97530 therapeutic activity, 97140 manual therapy, 97035 ultrasound, 97018  paraffin, 02989 moist heat, 97010 cryotherapy, 97032 electrical stimulation (manual), 97014 electrical stimulation unattended, 97760 Splinting (initial encounter), and DME and/or AE instructions  CONSULTED AND AGREED WITH PLAN OF CARE: Patient  PLAN FOR NEXT SESSION: retrograde massage and manual techniques, A/ROM, functional reaching. Follow up on HEP; adjust flexion glove as able. Paraffin prn; continue with scrub and carry; PROGRESS NOTE   Valentin Nightingale, OTR/L [phone] 07/13/2023, 3:04 PM

## 2023-07-16 ENCOUNTER — Ambulatory Visit (HOSPITAL_COMMUNITY): Payer: No Typology Code available for payment source | Admitting: Occupational Therapy

## 2023-07-16 ENCOUNTER — Encounter (HOSPITAL_COMMUNITY): Payer: Self-pay | Admitting: Occupational Therapy

## 2023-07-16 DIAGNOSIS — M25641 Stiffness of right hand, not elsewhere classified: Secondary | ICD-10-CM

## 2023-07-16 DIAGNOSIS — M25511 Pain in right shoulder: Secondary | ICD-10-CM

## 2023-07-16 DIAGNOSIS — M25611 Stiffness of right shoulder, not elsewhere classified: Secondary | ICD-10-CM

## 2023-07-16 DIAGNOSIS — R29898 Other symptoms and signs involving the musculoskeletal system: Secondary | ICD-10-CM

## 2023-07-16 NOTE — Therapy (Signed)
 OUTPATIENT OCCUPATIONAL THERAPY ORTHO TREATMENT   Patient Name: Stephanie Ramos MRN: [id number] DOB:21-Jul-1945, 78 y.o., female Today's Date: 07/16/2023    END OF SESSION:  OT End of Session - 07/16/23 1436     Visit Number 32    Number of Visits 37    Date for OT Re-Evaluation 08/07/23    Authorization Type [employer], copay: $15    Authorization Time Period no auth required    OT Start Time 1351    OT Stop Time 1436    OT Time Calculation (min) 45 min    Activity Tolerance Patient tolerated treatment well    Behavior During Therapy WFL for tasks assessed/performed            Past Medical History:  Diagnosis Date   High iron content of liver determined by magnetic resonance imaging 08/02/2021   Hyperlipemia    Past Surgical History:  Procedure Laterality Date   BREAST SURGERY     CATARACT EXTRACTION W/PHACO Left 08/11/2019   Procedure: CATARACT EXTRACTION PHACO AND INTRAOCULAR LENS PLACEMENT (IOC) (CDE: 9.68);  Surgeon: Tarri Farm, MD;  Location: [facility];  Service: Ophthalmology;  Laterality: Left;   CATARACT EXTRACTION W/PHACO Right 09/29/2019   Procedure: CATARACT EXTRACTION PHACO AND INTRAOCULAR LENS PLACEMENT RIGHT EYE;  Surgeon: Tarri Farm, MD;  Location: [facility];  Service: Ophthalmology;  Laterality: Right;  CDE: 10.60   DILITATION & CURRETTAGE/HYSTROSCOPY WITH ESSURE     Patient Active Problem List   Diagnosis Date Noted   Comminuted fracture of right humerus 03/29/2023   Preventative health care 03/10/2022   Hypercalcemia 09/08/2021   High iron content of liver determined by magnetic resonance imaging 08/02/2021   Hyperkalemia 07/21/2021   Elevated ferritin level 06/24/2021   Fatigue 05/20/2021   Iron deficiency 01/18/2021   Elevated BP without diagnosis of hypertension 01/18/2021   Overweight with body mass index (BMI) of 26 to 26.9 in adult 03/18/2020   Refused influenza vaccine 03/18/2020   Vertigo 12/28/2017   Hyperlipemia 12/28/2017    PCP: Dr. Saint Cranker REFERRING PROVIDER: Dr. Osa Blase  ONSET DATE: 02/15/23  REFERRING DIAG: rt prox humerus fx with regional pain syndrome of right upper etremity   THERAPY DIAG:  Acute pain of right shoulder  Stiffness of right shoulder, not elsewhere classified  Stiffness of right hand, not elsewhere classified  Other symptoms and signs involving the musculoskeletal system  Rationale for Evaluation and Treatment: Rehabilitation  SUBJECTIVE:   SUBJECTIVE STATEMENT: S: "I can move this ring finger more"   PERTINENT HISTORY: Pt is a 78 y/o female presenting s/p right proximal humerus fracture after falling when [place] up in the woods on 02/15/23. Pt attended therapy at [facility] approximately 4 times before transferring to this clinic.   PRECAUTIONS: Shoulder  WEIGHT BEARING RESTRICTIONS: Yes NWB  PAIN:  Are you having pain? No  FALLS: Has patient fallen in last 6 months? Yes. Number of falls 1  PLOF: Independent  PATIENT GOALS: To get the right arm moving again.   NEXT MD VISIT: 6 weeks from 06/11/23  OBJECTIVE:  Note: Objective measures were completed at Evaluation unless otherwise noted.  HAND DOMINANCE: Right  ADLs: Overall ADLs: Pt is unable to use the RUE for ADLs. She can hold paper plate between her fingers. She is unable to make a fist or use her hand and arm. Pt is using the LUE for all tasks, using hemi-dressing techniques and strategies for independence.    FUNCTIONAL OUTCOME MEASURES: Quick Dash:  72.73 06/08/23: 18.18  UPPER EXTREMITY ROM:       Assessed in sitting, er/IR adducted  Passive ROM Right eval Right 05/07/23 Right 06/08/23  Shoulder flexion 61 84 88  Shoulder abduction 54 66 77  Shoulder internal rotation 90 90 90  Shoulder external rotation 19 28 28   Elbow flexion 136 145 145  Elbow extension -30 -5 -5  Wrist flexion 55 70 75  Wrist extension 8 22 48  Wrist ulnar deviation 25    Wrist radial deviation 8 11 15    Wrist pronation 85 90   Wrist supination 85 90   (Blank rows = not tested)   Assessed in sitting, er/IR adducted  Active ROM Right eval Right 05/07/23 Right 06/08/23  Shoulder flexion 33 68 81  Shoulder abduction 41 56 71  Shoulder internal rotation 90 90 90  Shoulder external rotation 20 34 35  Elbow flexion 128 142 146  Elbow extension -45 -10 -10  Wrist flexion 55 60 62  Wrist extension 8 12 30   Wrist ulnar deviation 25    Wrist radial deviation 4 5 8   Wrist pronation 70 90 90  Wrist supination 80 88 88  (Blank rows = not tested)      Active ROM Right 05/07/23 Right 06/08/23  Thumb MCP (0-60) 34 36  Thumb IP (0-80) 12 26  Index MCP (0-90) 50 50  Index PIP (0-100) 52 64  Index DIP (0-70)  12 20  Long MCP (0-90)  42 48  Long PIP (0-100)  40 52  Long DIP (0-70)  0 0  Ring MCP (0-90)  30 44  Ring PIP (0-100)  34 54  Ring DIP (0-70)  0 0  Little MCP (0-90)  32 32  Little PIP (0-100)  36 52  Little DIP (0-70)  0 0  (Blank rows = not tested)  UPPER EXTREMITY MMT:       Unable to assess due to pain, limited active movement   12/2: shoulder evaluated on observation; MMT for elbow and wrist  MMT Right eval Right 06/08/23  Shoulder flexion 3-/5 3-/5  Shoulder abduction 3-/5 3-/5  Shoulder internal rotation 3/5 3/5  Shoulder external rotation 3/5 3/5  Elbow flexion 4/5 4+/5  Elbow extension 4-/5 4-/5  Wrist flexion 3+/5 4/5  Wrist extension 3+/5 4-/5  Wrist ulnar deviation 4/5 4/5  Wrist radial deviation 3+/5 4/5  Wrist pronation 4-/5 4+/5  Wrist supination 4-/5 4+/5  (Blank rows = not tested)  HAND FUNCTION: Unable, TBD   COORDINATION: Unable, TBD  SENSATION: WFL  EDEMA:  -Right  -Left Upper arm    32cm 30.5cm Elbow           29cm 24cm Mid-forearm 20cm 20.5cm Wrist            17cm 14.5cm Palm            18.5cm 17cm  05/07/23 -Right   Upper arm    31cm  Elbow           25.5cm Mid-forearm 20cm  Wrist            15.5cm  Palm             17.5cm   06/08/23 -Right   Upper arm    30.5cm  Elbow           26.5cm Mid-forearm  20.75cm  Wrist            15.5cm  Palm  17.25cm  OBSERVATIONS: pt with generalized edema throughout RUE; joint stiffness in digits, wrist, elbow, and shoulder. Consistent with possibility of developing CRPS noting dx of regional pain syndrome of RUE   TODAY'S TREATMENT:                                                                                                                              DATE:   2/10:  -Pulleys: flexion, abduction, x60" -Functional reaching and opening containers: 1st shelf pulling 10 items down and opening them with RUE only, then putting them back on the shelf -Digit ROM: composite flexion, abduction, opposition, finger taps, x10 each -MCP Stretches: flexion, extension, 2 x15" -Cards: holding 1/2 deck of cards, using thumb to push 10 cards out, then push and flip 10 more cars (Min difficulty). Holding entire deck using thumb to push out 10 cards (mod difficulty) -Coins: flipping 10 pennies, 3 nickels, 2 dimes, holding 10 coins at a time and placing in coins bank  -Rubber band: abduction, digit MCP extension, x10 -Tiny Peg board: holding 5 pegs at a time, working on in hand translation placing them in the peg board  07/13/23 -Scrub and carry: 3' scrub using towel roll followed by 3' carry with 8# weight, x2 reps -Digit A/ROM: composite flexion, opposition, finger taps 10 reps -Pinch Tree: yellow, red, green, blue resistance clips, placing on vertical pole by reaching up with mod difficulty as she reached head height due to shoulder mobility -nuts and bolts, unscrewing, then holding 2-3 bolts at 1 time, using in hand translation to replace them.  -Gripper: 15# 8 medium beads in vertical, 20# 8 medium beads in horizontal  07/09/23 -Scrub and carry: 3' scrub using towel roll followed by 3' carry with 8# weight -Digit A/ROM: composite flexion, opposition, 10 reps -Sponges:  12, 14, 18 -Gripper: large and medium beads with gripper vertical at 15# -Cards: pt holding 1/2 deck of cards in right palm, using thumb to push the top card of the pile while holding remaining deck with digits, working on grasp and in-hand manipulation. Min difficulty with task, increased time to prevent dropping cards -Coin manipulation: pt holding Sequence chips in palm and working on translating to fingertips to then place on towel. Mod difficulty to refrain from shaking the chips forward. Pt requiring max cuing not to shake and to use her fingers and thumb. 5 rounds completed.    PATIENT EDUCATION: Education details: Continue HEP Person educated: Patient Education method: Programmer, multimedia, Demonstration, Tactile cues, and Verbal cues Education comprehension: verbalized understanding and returned demonstration  HOME EXERCISE PROGRAM: Eval: Provided shoulder length edema glove/sleeve, educated on donning/doffing, wear and care; continue table slides from [facility] therapist 11/8: AA/ROM in supine 11/15: wrist stretch into extension  11/27: flexion glove wear 12/9: Wall Climbs 12/11: Towel Roll gripping and wringing out 12/20: A/ROM 12/30: shoulder stretch at wall  06/12/23: grasping and manipulating items such as cotton balls 06/20/23: scrub and carry protocol 1/23:  Self Digit Blocking  GOALS: Goals reviewed with patient? Yes  SHORT TERM GOALS: Target date: 05/09/23  Pt will be provided with and educated on HEP to improve RUE functioning required for use during ADLs.   Goal status: IN PROGRESS  2.  Pt will increase P/ROM of right shoulder by 50+ degrees to improve ability to perform dressing and bathing tasks independently.   Goal status: IN PROGRESS  3.  Pt will increase A/ROM of right elbow to full extension to improve ability to perform low level functional reaching tasks.   Goal status: IN PROGRESS  4.  Pt will decrease edema in RUE by 50% or greater to improve mobility in  RUE required for active participation in bathing and grooming tasks.   Goal status: MET  5.  Pt will demonstrate ability to make at least 50% of a grasp to promote use when grasping large items such as a cup or water bottle.   Goal status: IN PROGRESS   LONG TERM GOALS: Target date: 06/09/23  Pt will decrease pain in the RUE to 2/10 or less to improve ability to sleep for 3+ consecutive hours without waking due to pain.   Goal status: IN PROGRESS  2.  Pt will increase RUE A/ROM to at least 120 degrees shoulder flexion/abduction, and wrist extension by 25+ degrees to improve use of RUE during meal preparation tasks.   Goal status: IN PROGRESS  3.  Pt will increase RUE strength to 4-/5 or greater to improve ability to use RUE during housework tasks.   Goal status: IN PROGRESS  4.  Pt will increase grip strength to at least 20# and pinch strength to at least 5# to improve ability to grasp and carry groceries.  Baseline:  Goal status: IN PROGRESS  5.  Pt will decrease RUE fascial restrictions to min amounts or less to improve ability to perform functional reaching tasks.   Goal status: IN PROGRESS   ASSESSMENT:   CLINICAL IMPRESSION: This session pt started by working on shoulder mobility, while also incorporating fine motor skills for opening different types of containers. She then was able to address her finger mobility with ROM and stretches before continuing to address coordination and fine motor tasks, including in hand translation and working with typical day to day "tools" such as coins and cards. Overall coordination and mobility in her digits are improving well. Verbal and tactile cuing provided for positioning and technique.   PERFORMANCE DEFICITS: in functional skills including ADLs, IADLs, coordination, dexterity, proprioception, edema, ROM, strength, pain, fascial restrictions, Fine motor control, Gross motor control, and UE functional use    PLAN:  OT FREQUENCY:  2x/week  OT DURATION: 8 weeks  PLANNED INTERVENTIONS: 97168 OT Re-evaluation, 97535 self care/ADL training, 16109 therapeutic exercise, 97530 therapeutic activity, 97140 manual therapy, 97035 ultrasound, 97018 paraffin, 60454 moist heat, 97010 cryotherapy, 97032 electrical stimulation (manual), 97014 electrical stimulation unattended, 97760 Splinting (initial encounter), and DME and/or AE instructions  CONSULTED AND AGREED WITH PLAN OF CARE: Patient  PLAN FOR NEXT SESSION: retrograde massage and manual techniques, A/ROM, functional reaching. Follow up on HEP; adjust flexion glove as able. Paraffin prn; continue with scrub and carry   Leticia Raven, OTR/L [phone] 07/16/2023, 2:37 PM

## 2023-07-20 ENCOUNTER — Ambulatory Visit (HOSPITAL_COMMUNITY): Payer: No Typology Code available for payment source | Admitting: Occupational Therapy

## 2023-07-20 ENCOUNTER — Encounter (HOSPITAL_COMMUNITY): Payer: Self-pay | Admitting: Occupational Therapy

## 2023-07-20 DIAGNOSIS — M25511 Pain in right shoulder: Secondary | ICD-10-CM

## 2023-07-20 DIAGNOSIS — M25641 Stiffness of right hand, not elsewhere classified: Secondary | ICD-10-CM

## 2023-07-20 DIAGNOSIS — M25621 Stiffness of right elbow, not elsewhere classified: Secondary | ICD-10-CM

## 2023-07-20 DIAGNOSIS — M25611 Stiffness of right shoulder, not elsewhere classified: Secondary | ICD-10-CM

## 2023-07-20 DIAGNOSIS — R29898 Other symptoms and signs involving the musculoskeletal system: Secondary | ICD-10-CM

## 2023-07-20 NOTE — Therapy (Signed)
OUTPATIENT OCCUPATIONAL THERAPY ORTHO TREATMENT   Patient Name: Stephanie Ramos MRN: [id number] DOB:12/17/1945, 78 y.o., female Today's Date: 07/20/2023    END OF SESSION:  OT End of Session - 07/20/23 1454     Visit Number 33    Number of Visits 37    Date for OT Re-Evaluation 08/07/23    Authorization Type [employer], copay: $15    Authorization Time Period no auth required    OT Start Time 1345    OT Stop Time 1440    OT Time Calculation (min) 55 min    Activity Tolerance Patient tolerated treatment well    Behavior During Therapy WFL for tasks assessed/performed             Past Medical History:  Diagnosis Date   High iron content of liver determined by magnetic resonance imaging 08/02/2021   Hyperlipemia    Past Surgical History:  Procedure Laterality Date   BREAST SURGERY     CATARACT EXTRACTION W/PHACO Left 08/11/2019   Procedure: CATARACT EXTRACTION PHACO AND INTRAOCULAR LENS PLACEMENT (IOC) (CDE: 9.68);  Surgeon: [name], MD;  Location: [facility];  Service: Ophthalmology;  Laterality: Left;   CATARACT EXTRACTION W/PHACO Right 09/29/2019   Procedure: CATARACT EXTRACTION PHACO AND INTRAOCULAR LENS PLACEMENT RIGHT EYE;  Surgeon: [name], MD;  Location: [facility];  Service: Ophthalmology;  Laterality: Right;  CDE: 10.60   DILITATION & CURRETTAGE/HYSTROSCOPY WITH ESSURE     Patient Active Problem List   Diagnosis Date Noted   Comminuted fracture of right humerus 03/29/2023   Preventative health care 03/10/2022   Hypercalcemia 09/08/2021   High iron content of liver determined by magnetic resonance imaging 08/02/2021   Hyperkalemia 07/21/2021   Elevated ferritin level 06/24/2021   Fatigue 05/20/2021   Iron deficiency 01/18/2021   Elevated BP without diagnosis of hypertension 01/18/2021   Overweight with body mass index (BMI) of 26 to 26.9 in adult 03/18/2020   Refused influenza vaccine 03/18/2020   Vertigo 12/28/2017   Hyperlipemia 12/28/2017    PCP: Dr. Christel Mormon REFERRING PROVIDER: Dr. Teryl Lucy  ONSET DATE: 02/15/23  REFERRING DIAG: rt prox humerus fx with regional pain syndrome of right upper etremity   THERAPY DIAG:  Acute pain of right shoulder  Stiffness of right shoulder, not elsewhere classified  Stiffness of right hand, not elsewhere classified  Other symptoms and signs involving the musculoskeletal system  Stiffness of right elbow, not elsewhere classified  Rationale for Evaluation and Treatment: Rehabilitation  SUBJECTIVE:   SUBJECTIVE STATEMENT: S: "I have been doing the scrub and carry at home"   PERTINENT HISTORY: Pt is a 78 y/o female presenting s/p right proximal humerus fracture after falling when [place] up in the woods on 02/15/23. Pt attended therapy at [facility] approximately 4 times before transferring to this clinic.   PRECAUTIONS: Shoulder  WEIGHT BEARING RESTRICTIONS: Yes NWB  PAIN:  Are you having pain? No  FALLS: Has patient fallen in last 6 months? Yes. Number of falls 1  PLOF: Independent  PATIENT GOALS: To get the right arm moving again.   NEXT MD VISIT: 6 weeks from 06/11/23  OBJECTIVE:  Note: Objective measures were completed at Evaluation unless otherwise noted.  HAND DOMINANCE: Right  ADLs: Overall ADLs: Pt is unable to use the RUE for ADLs. She can hold paper plate between her fingers. She is unable to make a fist or use her hand and arm. Pt is using the LUE for all tasks, using hemi-dressing techniques  and strategies for independence.    FUNCTIONAL OUTCOME MEASURES: Quick Dash: 72.73 06/08/23: 18.18  UPPER EXTREMITY ROM:       Assessed in sitting, er/IR adducted  Passive ROM Right eval Right 05/07/23 Right 06/08/23 Right  07/21/23  Shoulder flexion 61 84 88 105  Shoulder abduction 54 66 77 103  Shoulder internal rotation 90 90 90 90  Shoulder external rotation 19 28 28  40  Elbow flexion 136 145 145 145  Elbow extension -30 -5 -5 -5  Wrist  flexion 55 70 75 80  Wrist extension 8 22 48 70  Wrist ulnar deviation 25   10  Wrist radial deviation 8 11 15  60  Wrist pronation 85 90    Wrist supination 85 90    (Blank rows = not tested)   Assessed in sitting, er/IR adducted  Active ROM Right eval Right 05/07/23 Right 06/08/23 Right  2/14  Shoulder flexion 33 68 81 90  Shoulder abduction 41 56 71 88  Shoulder internal rotation 90 90 90 90  Shoulder external rotation 20 34 35 65  Elbow flexion 128 142 146 160  Elbow extension -45 -10 -10 -10  Wrist flexion 55 60 62 62  Wrist extension 8 12 30  33  Wrist ulnar deviation 25     Wrist radial deviation 4 5 8 14   Wrist pronation 70 90 90 90  Wrist supination 80 88 88 90  (Blank rows = not tested)      Active ROM Right 05/07/23 Right 06/08/23 Right 2/14  Thumb MCP (0-60) 34 36 46  Thumb IP (0-80) 12 26 40  Index MCP (0-90) 50 50 58  Index PIP (0-100) 52 64 68  Index DIP (0-70)  12 20 25   Long MCP (0-90)  42 48 55  Long PIP (0-100)  40 52 70  Long DIP (0-70)  0 0 7  Ring MCP (0-90)  30 44 62  Ring PIP (0-100)  34 54 59  Ring DIP (0-70)  0 0 5  Little MCP (0-90)  32 32 60  Little PIP (0-100)  36 52 70  Little DIP (0-70)  0 0 13  (Blank rows = not tested)  UPPER EXTREMITY MMT:       Unable to assess due to pain, limited active movement   12/2: shoulder evaluated on observation; MMT for elbow and wrist  MMT Right eval Right 06/08/23 Right  07/20/23  Shoulder flexion 3-/5 3-/5 4/5  Shoulder abduction 3-/5 3-/5 3/5  Shoulder internal rotation 3/5 3/5 4+/5  Shoulder external rotation 3/5 3/5 4+/5  Elbow flexion 4/5 4+/5 5/5  Elbow extension 4-/5 4-/5 5/5  Wrist flexion 3+/5 4/5 5/5  Wrist extension 3+/5 4-/5 5/5  Wrist ulnar deviation 4/5 4/5 4+/5  Wrist radial deviation 3+/5 4/5 4+/5  Wrist pronation 4-/5 4+/5 5/5  Wrist supination 4-/5 4+/5 5/5   (Blank rows = not tested)  HAND FUNCTION: Unable, TBD   COORDINATION: Unable, TBD  SENSATION: WFL  EDEMA:   -Right  -Left Upper arm    32cm 30.5cm Elbow           29cm 24cm Mid-forearm 20cm 20.5cm Wrist            17cm 14.5cm Palm            18.5cm 17cm  05/07/23 -Right   Upper arm    31cm  Elbow           25.5cm Mid-forearm 20cm  Wrist  15.5cm  Palm            17.5cm   06/08/23 -Right   Upper arm    30.5cm  Elbow           26.5cm Mid-forearm  20.75cm  Wrist            15.5cm  Palm            17.25cm  OBSERVATIONS: pt with generalized edema throughout RUE; joint stiffness in digits, wrist, elbow, and shoulder. Consistent with possibility of developing CRPS noting dx of regional pain syndrome of RUE   TODAY'S TREATMENT:                                                                                                                              DATE:   07/21/23 -Pulleys: flexion, abduction, x60" -Digit ROM: composite flexion, abduction, opposition, finger taps, x10 each -MCP Stretches: flexion, extension, 2 x15" -Coins: pincer grasp to put into container and translation from finger tips to palm - Fine motor: small peg board, screw board  - Grip strength: 15# 8 medium beads in vertical, 20# 8 medium beads in horizontal  2/10:  -Pulleys: flexion, abduction, x60" -Functional reaching and opening containers: 1st shelf pulling 10 items down and opening them with RUE only, then putting them back on the shelf -Digit ROM: composite flexion, abduction, opposition, finger taps, x10 each -MCP Stretches: flexion, extension, 2 x15" -Cards: holding 1/2 deck of cards, using thumb to push 10 cards out, then push and flip 10 more cars (Min difficulty). Holding entire deck using thumb to push out 10 cards (mod difficulty) -Coins: flipping 10 pennies, 3 nickels, 2 dimes, holding 10 coins at a time and placing in coins bank  -Rubber band: abduction, digit MCP extension, x10 -Tiny Peg board: holding 5 pegs at a time, working on in hand translation placing them in the peg  board  07/13/23 -Scrub and carry: 3' scrub using towel roll followed by 3' carry with 8# weight, x2 reps -Digit A/ROM: composite flexion, opposition, finger taps 10 reps -Pinch Tree: yellow, red, green, blue resistance clips, placing on vertical pole by reaching up with mod difficulty as she reached head height due to shoulder mobility -nuts and bolts, unscrewing, then holding 2-3 bolts at 1 time, using in hand translation to replace them.  -Gripper: 15# 8 medium beads in vertical, 20# 8 medium beads in horizontal   PATIENT EDUCATION: Education details: Continue HEP Person educated: Patient Education method: Explanation, Demonstration, Tactile cues, and Verbal cues Education comprehension: verbalized understanding and returned demonstration  HOME EXERCISE PROGRAM: Eval: Provided shoulder length edema glove/sleeve, educated on donning/doffing, wear and care; continue table slides from [facility] therapist 11/8: AA/ROM in supine 11/15: wrist stretch into extension  11/27: flexion glove wear 12/9: Wall Climbs 12/11: Towel Roll gripping and wringing out 12/20: A/ROM 12/30: shoulder stretch at wall  06/12/23: grasping and manipulating items such as cotton balls 06/20/23: scrub  and carry protocol 1/23: Self Digit Blocking  GOALS: Goals reviewed with patient? Yes  SHORT TERM GOALS: Target date: 05/09/23  Pt will be provided with and educated on HEP to improve RUE functioning required for use during ADLs.   Goal status: IN PROGRESS  2.  Pt will increase P/ROM of right shoulder by 50+ degrees to improve ability to perform dressing and bathing tasks independently.   Goal status: IN PROGRESS  3.  Pt will increase A/ROM of right elbow to full extension to improve ability to perform low level functional reaching tasks.   Goal status: IN PROGRESS  4.  Pt will decrease edema in RUE by 50% or greater to improve mobility in RUE required for active participation in bathing and grooming  tasks.   Goal status: MET  5.  Pt will demonstrate ability to make at least 50% of a grasp to promote use when grasping large items such as a cup or water bottle.   Goal status: IN PROGRESS   LONG TERM GOALS: Target date: 06/09/23  Pt will decrease pain in the RUE to 2/10 or less to improve ability to sleep for 3+ consecutive hours without waking due to pain.   Goal status: IN PROGRESS  2.  Pt will increase RUE A/ROM to at least 120 degrees shoulder flexion/abduction, and wrist extension by 25+ degrees to improve use of RUE during meal preparation tasks.   Goal status: IN PROGRESS  3.  Pt will increase RUE strength to 4-/5 or greater to improve ability to use RUE during housework tasks.   Goal status: IN PROGRESS  4.  Pt will increase grip strength to at least 20# and pinch strength to at least 5# to improve ability to grasp and carry groceries.  Baseline:  Goal status: IN PROGRESS  5.  Pt will decrease RUE fascial restrictions to min amounts or less to improve ability to perform functional reaching tasks.   Goal status: IN PROGRESS   ASSESSMENT:   CLINICAL IMPRESSION: Continued with shoulder mobility at start of session. Addressed digit ROM with pt actively making first and then OT passively stretching into fist and each digit individually. Targeted fine motor skills with coin activity, small peg board, and screw board. Pt to continue HEP.   PERFORMANCE DEFICITS: in functional skills including ADLs, IADLs, coordination, dexterity, proprioception, edema, ROM, strength, pain, fascial restrictions, Fine motor control, Gross motor control, and UE functional use    PLAN:  OT FREQUENCY: 2x/week  OT DURATION: 8 weeks  PLANNED INTERVENTIONS: 97168 OT Re-evaluation, 97535 self care/ADL training, 30865 therapeutic exercise, 97530 therapeutic activity, 97140 manual therapy, 97035 ultrasound, 97018 paraffin, 78469 moist heat, 97010 cryotherapy, 97032 electrical stimulation (manual),  97014 electrical stimulation unattended, 97760 Splinting (initial encounter), and DME and/or AE instructions  CONSULTED AND AGREED WITH PLAN OF CARE: Patient  PLAN FOR NEXT SESSION: retrograde massage and manual techniques, A/ROM, functional reaching. Follow up on HEP; adjust flexion glove as able. Paraffin prn; continue with scrub and carry   Lurena Joiner, OTR/L [phone] 07/20/2023, 2:57 PM

## 2023-07-23 DIAGNOSIS — S42294D Other nondisplaced fracture of upper end of right humerus, subsequent encounter for fracture with routine healing: Secondary | ICD-10-CM | POA: Diagnosis not present

## 2023-07-27 ENCOUNTER — Encounter (HOSPITAL_COMMUNITY): Payer: Self-pay | Admitting: Occupational Therapy

## 2023-07-27 ENCOUNTER — Ambulatory Visit (HOSPITAL_COMMUNITY): Payer: No Typology Code available for payment source | Admitting: Occupational Therapy

## 2023-07-27 DIAGNOSIS — M25611 Stiffness of right shoulder, not elsewhere classified: Secondary | ICD-10-CM

## 2023-07-27 DIAGNOSIS — M25511 Pain in right shoulder: Secondary | ICD-10-CM | POA: Diagnosis not present

## 2023-07-27 DIAGNOSIS — R29898 Other symptoms and signs involving the musculoskeletal system: Secondary | ICD-10-CM

## 2023-07-27 DIAGNOSIS — M25641 Stiffness of right hand, not elsewhere classified: Secondary | ICD-10-CM

## 2023-07-27 NOTE — Therapy (Signed)
OUTPATIENT OCCUPATIONAL THERAPY ORTHO TREATMENT   Patient Name: Stephanie Ramos MRN: [id number] DOB:Mar 08, 1946, 78 y.o., female Today's Date: 07/27/2023    END OF SESSION:  OT End of Session - 07/27/23 1157     Visit Number 34    Number of Visits 37    Date for OT Re-Evaluation 08/07/23    Authorization Type [employer], copay: $15    Authorization Time Period no auth required    OT Start Time 1108    OT Stop Time 1152    OT Time Calculation (min) 44 min    Activity Tolerance Patient tolerated treatment well    Behavior During Therapy WFL for tasks assessed/performed             Past Medical History:  Diagnosis Date   High iron content of liver determined by magnetic resonance imaging 08/02/2021   Hyperlipemia    Past Surgical History:  Procedure Laterality Date   BREAST SURGERY     CATARACT EXTRACTION W/PHACO Left 08/11/2019   Procedure: CATARACT EXTRACTION PHACO AND INTRAOCULAR LENS PLACEMENT (IOC) (CDE: 9.68);  Surgeon: [name], MD;  Location: [facility];  Service: Ophthalmology;  Laterality: Left;   CATARACT EXTRACTION W/PHACO Right 09/29/2019   Procedure: CATARACT EXTRACTION PHACO AND INTRAOCULAR LENS PLACEMENT RIGHT EYE;  Surgeon: [name], MD;  Location: [facility];  Service: Ophthalmology;  Laterality: Right;  CDE: 10.60   DILITATION & CURRETTAGE/HYSTROSCOPY WITH ESSURE     Patient Active Problem List   Diagnosis Date Noted   Comminuted fracture of right humerus 03/29/2023   Preventative health care 03/10/2022   Hypercalcemia 09/08/2021   High iron content of liver determined by magnetic resonance imaging 08/02/2021   Hyperkalemia 07/21/2021   Elevated ferritin level 06/24/2021   Fatigue 05/20/2021   Iron deficiency 01/18/2021   Elevated BP without diagnosis of hypertension 01/18/2021   Overweight with body mass index (BMI) of 26 to 26.9 in adult 03/18/2020   Refused influenza vaccine 03/18/2020   Vertigo 12/28/2017   Hyperlipemia 12/28/2017    PCP: Dr. Christel Mormon REFERRING PROVIDER: Dr. Teryl Lucy  ONSET DATE: 02/15/23  REFERRING DIAG: rt prox humerus fx with regional pain syndrome of right upper etremity   THERAPY DIAG:  Acute pain of right shoulder  Stiffness of right shoulder, not elsewhere classified  Stiffness of right hand, not elsewhere classified  Other symptoms and signs involving the musculoskeletal system  Rationale for Evaluation and Treatment: Rehabilitation  SUBJECTIVE:   SUBJECTIVE STATEMENT: S: "Things are just the same with my hand"   PERTINENT HISTORY: Pt is a 78 y/o female presenting s/p right proximal humerus fracture after falling when cleaning up in the woods on 02/15/23. Pt attended therapy at [facility] approximately 4 times before transferring to this clinic.   PRECAUTIONS: Shoulder  WEIGHT BEARING RESTRICTIONS: Yes NWB  PAIN:  Are you having pain? No  FALLS: Has patient fallen in last 6 months? Yes. Number of falls 1  PLOF: Independent  PATIENT GOALS: To get the right arm moving again.   NEXT MD VISIT: 6 weeks from 06/11/23  OBJECTIVE:  Note: Objective measures were completed at Evaluation unless otherwise noted.  HAND DOMINANCE: Right  ADLs: Overall ADLs: Pt is unable to use the RUE for ADLs. She can hold paper plate between her fingers. She is unable to make a fist or use her hand and arm. Pt is using the LUE for all tasks, using hemi-dressing techniques and strategies for independence.    FUNCTIONAL OUTCOME MEASURES:  Quick Dash: 72.73 06/08/23: 18.18  UPPER EXTREMITY ROM:       Assessed in sitting, er/IR adducted  Passive ROM Right eval Right 05/07/23 Right 06/08/23 Right  07/21/23  Shoulder flexion 61 84 88 105  Shoulder abduction 54 66 77 103  Shoulder internal rotation 90 90 90 90  Shoulder external rotation 19 28 28  40  Elbow flexion 136 145 145 145  Elbow extension -30 -5 -5 -5  Wrist flexion 55 70 75 80  Wrist extension 8 22 48 70  Wrist ulnar  deviation 25   10  Wrist radial deviation 8 11 15  60  Wrist pronation 85 90    Wrist supination 85 90    (Blank rows = not tested)   Assessed in sitting, er/IR adducted  Active ROM Right eval Right 05/07/23 Right 06/08/23 Right  2/14  Shoulder flexion 33 68 81 90  Shoulder abduction 41 56 71 88  Shoulder internal rotation 90 90 90 90  Shoulder external rotation 20 34 35 65  Elbow flexion 128 142 146 160  Elbow extension -45 -10 -10 -10  Wrist flexion 55 60 62 62  Wrist extension 8 12 30  33  Wrist ulnar deviation 25     Wrist radial deviation 4 5 8 14   Wrist pronation 70 90 90 90  Wrist supination 80 88 88 90  (Blank rows = not tested)      Active ROM Right 05/07/23 Right 06/08/23 Right 2/14  Thumb MCP (0-60) 34 36 46  Thumb IP (0-80) 12 26 40  Index MCP (0-90) 50 50 58  Index PIP (0-100) 52 64 68  Index DIP (0-70)  12 20 25   Long MCP (0-90)  42 48 55  Long PIP (0-100)  40 52 70  Long DIP (0-70)  0 0 7  Ring MCP (0-90)  30 44 62  Ring PIP (0-100)  34 54 59  Ring DIP (0-70)  0 0 5  Little MCP (0-90)  32 32 60  Little PIP (0-100)  36 52 70  Little DIP (0-70)  0 0 13  (Blank rows = not tested)  UPPER EXTREMITY MMT:       Unable to assess due to pain, limited active movement   12/2: shoulder evaluated on observation; MMT for elbow and wrist  MMT Right eval Right 06/08/23 Right  07/20/23  Shoulder flexion 3-/5 3-/5 4/5  Shoulder abduction 3-/5 3-/5 3/5  Shoulder internal rotation 3/5 3/5 4+/5  Shoulder external rotation 3/5 3/5 4+/5  Elbow flexion 4/5 4+/5 5/5  Elbow extension 4-/5 4-/5 5/5  Wrist flexion 3+/5 4/5 5/5  Wrist extension 3+/5 4-/5 5/5  Wrist ulnar deviation 4/5 4/5 4+/5  Wrist radial deviation 3+/5 4/5 4+/5  Wrist pronation 4-/5 4+/5 5/5  Wrist supination 4-/5 4+/5 5/5   (Blank rows = not tested)  HAND FUNCTION: Unable, TBD   COORDINATION: Unable, TBD  SENSATION: WFL  EDEMA:  -Right  -Left Upper arm    32cm 30.5cm Elbow            29cm 24cm Mid-forearm 20cm 20.5cm Wrist            17cm 14.5cm Palm            18.5cm 17cm  05/07/23 -Right   Upper arm    31cm  Elbow           25.5cm Mid-forearm 20cm  Wrist  15.5cm  Palm            17.5cm   06/08/23 -Right   Upper arm    30.5cm  Elbow           26.5cm Mid-forearm  20.75cm  Wrist            15.5cm  Palm            17.25cm  OBSERVATIONS: pt with generalized edema throughout RUE; joint stiffness in digits, wrist, elbow, and shoulder. Consistent with possibility of developing CRPS noting dx of regional pain syndrome of RUE   TODAY'S TREATMENT:                                                                                                                              DATE:   07/27/23 -Digit ROM: composite flexion, abduction, opposition, finger taps, x12 each -MCP Stretches: flexion, extension, 2 x15" -Wrist strengthening: 2#, flexion, extension, ulnar/radial deviation, supination/pronation, x12 -Wrist ABC's -Gripper: 20# vertical 8 medium beads, 2 rounds -UBE: level 3, 2.5' forwards and backwards  07/21/23 -Pulleys: flexion, abduction, x60" -Digit ROM: composite flexion, abduction, opposition, finger taps, x10 each -MCP Stretches: flexion, extension, 2 x15" -Coins: pincer grasp to put into container and translation from finger tips to palm - Fine motor: small peg board, screw board  - Grip strength: 15# 8 medium beads in vertical, 20# 8 medium beads in horizontal  2/10:  -Pulleys: flexion, abduction, x60" -Functional reaching and opening containers: 1st shelf pulling 10 items down and opening them with RUE only, then putting them back on the shelf -Digit ROM: composite flexion, abduction, opposition, finger taps, x10 each -MCP Stretches: flexion, extension, 2 x15" -Cards: holding 1/2 deck of cards, using thumb to push 10 cards out, then push and flip 10 more cars (Min difficulty). Holding entire deck using thumb to push out 10 cards (mod  difficulty) -Coins: flipping 10 pennies, 3 nickels, 2 dimes, holding 10 coins at a time and placing in coins bank  -Rubber band: abduction, digit MCP extension, x10 -Tiny Peg board: holding 5 pegs at a time, working on in hand translation placing them in the peg board   PATIENT EDUCATION: Education details: Continue HEP Person educated: Patient Education method: Programmer, multimedia, Demonstration, Tactile cues, and Verbal cues Education comprehension: verbalized understanding and returned demonstration  HOME EXERCISE PROGRAM: Eval: Provided shoulder length edema glove/sleeve, educated on donning/doffing, wear and care; continue table slides from [facility] therapist 11/8: AA/ROM in supine 11/15: wrist stretch into extension  11/27: flexion glove wear 12/9: Wall Climbs 12/11: Towel Roll gripping and wringing out 12/20: A/ROM 12/30: shoulder stretch at wall  06/12/23: grasping and manipulating items such as cotton balls 06/20/23: scrub and carry protocol 1/23: Self Digit Blocking  GOALS: Goals reviewed with patient? Yes  SHORT TERM GOALS: Target date: 05/09/23  Pt will be provided with and educated on HEP to improve RUE functioning required for use during ADLs.   Goal  status: IN PROGRESS  2.  Pt will increase P/ROM of right shoulder by 50+ degrees to improve ability to perform dressing and bathing tasks independently.   Goal status: IN PROGRESS  3.  Pt will increase A/ROM of right elbow to full extension to improve ability to perform low level functional reaching tasks.   Goal status: IN PROGRESS  4.  Pt will decrease edema in RUE by 50% or greater to improve mobility in RUE required for active participation in bathing and grooming tasks.   Goal status: MET  5.  Pt will demonstrate ability to make at least 50% of a grasp to promote use when grasping large items such as a cup or water bottle.   Goal status: IN PROGRESS   LONG TERM GOALS: Target date: 06/09/23  Pt will decrease  pain in the RUE to 2/10 or less to improve ability to sleep for 3+ consecutive hours without waking due to pain.   Goal status: IN PROGRESS  2.  Pt will increase RUE A/ROM to at least 120 degrees shoulder flexion/abduction, and wrist extension by 25+ degrees to improve use of RUE during meal preparation tasks.   Goal status: IN PROGRESS  3.  Pt will increase RUE strength to 4-/5 or greater to improve ability to use RUE during housework tasks.   Goal status: IN PROGRESS  4.  Pt will increase grip strength to at least 20# and pinch strength to at least 5# to improve ability to grasp and carry groceries.   Goal status: IN PROGRESS  5.  Pt will decrease RUE fascial restrictions to min amounts or less to improve ability to perform functional reaching tasks.   Goal status: IN PROGRESS   ASSESSMENT:   CLINICAL IMPRESSION: Patient continues to feel that her hand is not making much progress. Session continues to focus on overall hand mobility, as well as strengthening and use of hand functionally. OT having pt focus on using mainly/only her RUE with all tasks, including the UBE, in order to improve the grasp and strengthening of the tendons. Verbal and tactile cuing provided throughout session for positioning and technique.   PERFORMANCE DEFICITS: in functional skills including ADLs, IADLs, coordination, dexterity, proprioception, edema, ROM, strength, pain, fascial restrictions, Fine motor control, Gross motor control, and UE functional use    PLAN:  OT FREQUENCY: 2x/week  OT DURATION: 8 weeks  PLANNED INTERVENTIONS: 97168 OT Re-evaluation, 97535 self care/ADL training, 16109 therapeutic exercise, 97530 therapeutic activity, 97140 manual therapy, 97035 ultrasound, 97018 paraffin, 60454 moist heat, 97010 cryotherapy, 97032 electrical stimulation (manual), 97014 electrical stimulation unattended, 97760 Splinting (initial encounter), and DME and/or AE instructions  CONSULTED AND AGREED  WITH PLAN OF CARE: Patient  PLAN FOR NEXT SESSION: retrograde massage and manual techniques, A/ROM, functional reaching. Follow up on HEP; adjust flexion glove as able. Paraffin prn; continue with scrub and carry   Trish Mage, OTR/L [phone] 07/27/2023, 11:58 AM

## 2023-07-31 ENCOUNTER — Encounter (HOSPITAL_COMMUNITY): Payer: Self-pay | Admitting: Occupational Therapy

## 2023-07-31 ENCOUNTER — Ambulatory Visit (HOSPITAL_COMMUNITY): Payer: No Typology Code available for payment source | Admitting: Occupational Therapy

## 2023-07-31 DIAGNOSIS — M25641 Stiffness of right hand, not elsewhere classified: Secondary | ICD-10-CM

## 2023-07-31 DIAGNOSIS — R29898 Other symptoms and signs involving the musculoskeletal system: Secondary | ICD-10-CM

## 2023-07-31 DIAGNOSIS — M25511 Pain in right shoulder: Secondary | ICD-10-CM

## 2023-07-31 DIAGNOSIS — M25611 Stiffness of right shoulder, not elsewhere classified: Secondary | ICD-10-CM

## 2023-07-31 DIAGNOSIS — M25621 Stiffness of right elbow, not elsewhere classified: Secondary | ICD-10-CM

## 2023-07-31 NOTE — Therapy (Unsigned)
 OUTPATIENT OCCUPATIONAL THERAPY ORTHO TREATMENT   Patient Name: Stephanie Ramos MRN: [id number] DOB:05-17-1946, 78 y.o., female Today's Date: 08/01/2023    END OF SESSION:  OT End of Session - 07/31/23 1149     Visit Number 35    Number of Visits 37    Date for OT Re-Evaluation 08/07/23    Authorization Type [employer], copay: $15    Authorization Time Period no auth required    OT Start Time 1149    OT Stop Time 1234    OT Time Calculation (min) 45 min    Activity Tolerance Patient tolerated treatment well    Behavior During Therapy WFL for tasks assessed/performed              Past Medical History:  Diagnosis Date   High iron content of liver determined by magnetic resonance imaging 08/02/2021   Hyperlipemia    Past Surgical History:  Procedure Laterality Date   BREAST SURGERY     CATARACT EXTRACTION W/PHACO Left 08/11/2019   Procedure: CATARACT EXTRACTION PHACO AND INTRAOCULAR LENS PLACEMENT (IOC) (CDE: 9.68);  Surgeon: [name], MD;  Location: [facility];  Service: Ophthalmology;  Laterality: Left;   CATARACT EXTRACTION W/PHACO Right 09/29/2019   Procedure: CATARACT EXTRACTION PHACO AND INTRAOCULAR LENS PLACEMENT RIGHT EYE;  Surgeon: [name], MD;  Location: [facility];  Service: Ophthalmology;  Laterality: Right;  CDE: 10.60   DILITATION & CURRETTAGE/HYSTROSCOPY WITH ESSURE     Patient Active Problem List   Diagnosis Date Noted   Comminuted fracture of right humerus 03/29/2023   Preventative health care 03/10/2022   Hypercalcemia 09/08/2021   High iron content of liver determined by magnetic resonance imaging 08/02/2021   Hyperkalemia 07/21/2021   Elevated ferritin level 06/24/2021   Fatigue 05/20/2021   Iron deficiency 01/18/2021   Elevated BP without diagnosis of hypertension 01/18/2021   Overweight with body mass index (BMI) of 26 to 26.9 in adult 03/18/2020   Refused influenza vaccine 03/18/2020   Vertigo 12/28/2017   Hyperlipemia 12/28/2017    PCP: Dr. Christel Mormon REFERRING PROVIDER: Dr. Teryl Lucy  ONSET DATE: 02/15/23  REFERRING DIAG: rt prox humerus fx with regional pain syndrome of right upper etremity   THERAPY DIAG:  Acute pain of right shoulder  Stiffness of right shoulder, not elsewhere classified  Stiffness of right hand, not elsewhere classified  Stiffness of right elbow, not elsewhere classified  Other symptoms and signs involving the musculoskeletal system  Rationale for Evaluation and Treatment: Rehabilitation  SUBJECTIVE:   SUBJECTIVE STATEMENT: S: "It just doesn't seem to be getting better"   PERTINENT HISTORY: Pt is a 78 y/o female presenting s/p right proximal humerus fracture after falling when [place] up in the woods on 02/15/23. Pt attended therapy at [facility] approximately 4 times before transferring to this clinic.   PRECAUTIONS: Shoulder  WEIGHT BEARING RESTRICTIONS: Yes NWB  PAIN:  Are you having pain? No  FALLS: Has patient fallen in last 6 months? Yes. Number of falls 1  PLOF: Independent  PATIENT GOALS: To get the right arm moving again.   NEXT MD VISIT: 6 weeks from 06/11/23  OBJECTIVE:  Note: Objective measures were completed at Evaluation unless otherwise noted.  HAND DOMINANCE: Right  ADLs: Overall ADLs: Pt is unable to use the RUE for ADLs. She can hold paper plate between her fingers. She is unable to make a fist or use her hand and arm. Pt is using the LUE for all tasks, using hemi-dressing techniques and  strategies for independence.    FUNCTIONAL OUTCOME MEASURES: Quick Dash: 72.73 06/08/23: 18.18  UPPER EXTREMITY ROM:       Assessed in sitting, er/IR adducted  Passive ROM Right eval Right 05/07/23 Right 06/08/23 Right  07/20/23  Shoulder flexion 61 84 88 105  Shoulder abduction 54 66 77 103  Shoulder internal rotation 90 90 90 90  Shoulder external rotation 19 28 28  40  Elbow flexion 136 145 145 145  Elbow extension -30 -5 -5 -5  Wrist flexion 55  70 75 80  Wrist extension 8 22 48 70  Wrist ulnar deviation 25   10  Wrist radial deviation 8 11 15  60  Wrist pronation 85 90    Wrist supination 85 90    (Blank rows = not tested)   Assessed in sitting, er/IR adducted  Active ROM Right eval Right 05/07/23 Right 06/08/23 Right  2/14  Shoulder flexion 33 68 81 90  Shoulder abduction 41 56 71 88  Shoulder internal rotation 90 90 90 90  Shoulder external rotation 20 34 35 65  Elbow flexion 128 142 146 160  Elbow extension -45 -10 -10 -10  Wrist flexion 55 60 62 62  Wrist extension 8 12 30  33  Wrist ulnar deviation 25     Wrist radial deviation 4 5 8 14   Wrist pronation 70 90 90 90  Wrist supination 80 88 88 90  (Blank rows = not tested)      Active ROM Right 05/07/23 Right 06/08/23 Right 2/14  Thumb MCP (0-60) 34 36 46  Thumb IP (0-80) 12 26 40  Index MCP (0-90) 50 50 58  Index PIP (0-100) 52 64 68  Index DIP (0-70)  12 20 25   Long MCP (0-90)  42 48 55  Long PIP (0-100)  40 52 70  Long DIP (0-70)  0 0 7  Ring MCP (0-90)  30 44 62  Ring PIP (0-100)  34 54 59  Ring DIP (0-70)  0 0 5  Little MCP (0-90)  32 32 60  Little PIP (0-100)  36 52 70  Little DIP (0-70)  0 0 13  (Blank rows = not tested)  UPPER EXTREMITY MMT:       Unable to assess due to pain, limited active movement   12/2: shoulder evaluated on observation; MMT for elbow and wrist  MMT Right eval Right 06/08/23 Right  07/20/23  Shoulder flexion 3-/5 3-/5 4/5  Shoulder abduction 3-/5 3-/5 3/5  Shoulder internal rotation 3/5 3/5 4+/5  Shoulder external rotation 3/5 3/5 4+/5  Elbow flexion 4/5 4+/5 5/5  Elbow extension 4-/5 4-/5 5/5  Wrist flexion 3+/5 4/5 5/5  Wrist extension 3+/5 4-/5 5/5  Wrist ulnar deviation 4/5 4/5 4+/5  Wrist radial deviation 3+/5 4/5 4+/5  Wrist pronation 4-/5 4+/5 5/5  Wrist supination 4-/5 4+/5 5/5   (Blank rows = not tested)  HAND FUNCTION: Unable, TBD   COORDINATION: Unable, TBD  SENSATION: WFL  EDEMA:   -Right  -Left Upper arm    32cm 30.5cm Elbow           29cm 24cm Mid-forearm 20cm 20.5cm Wrist            17cm 14.5cm Palm            18.5cm 17cm  05/07/23 -Right   Upper arm    31cm  Elbow           25.5cm Mid-forearm 20cm  Wrist  15.5cm  Palm            17.5cm   06/08/23 -Right   Upper arm    30.5cm  Elbow           26.5cm Mid-forearm  20.75cm  Wrist            15.5cm  Palm            17.25cm  OBSERVATIONS: pt with generalized edema throughout RUE; joint stiffness in digits, wrist, elbow, and shoulder. Consistent with possibility of developing CRPS noting dx of regional pain syndrome of RUE   TODAY'S TREATMENT:                                                                                                                              DATE:   07/31/23 -Theraputty: yellow putty, roll into a ball, flatten into a pancake, roll into a ball, tripod pinch x10, lateral pinch x10 -Gripper: 22# attempted both vertical and horizontal for 5', switched to 20# vertical 8 medium beads, attempted 25# unable to fully squeeze -Green Eggsercizer squeeze with emphasis on fingers x10 -Pinch tree: red, green, blue, reaching into flexion, middle and thumb tip to tip pinch up, modified tripod pinch (3rd/4th digits to thumb), x7 each clip  07/27/23 -Digit ROM: composite flexion, abduction, opposition, finger taps, x12 each -MCP Stretches: flexion, extension, 2 x15" -Wrist strengthening: 2#, flexion, extension, ulnar/radial deviation, supination/pronation, x12 -Wrist ABC's -Gripper: 20# vertical 8 medium beads, 2 rounds -UBE: level 3, 2.5' forwards and backwards  07/21/23 -Pulleys: flexion, abduction, x60" -Digit ROM: composite flexion, abduction, opposition, finger taps, x10 each -MCP Stretches: flexion, extension, 2 x15" -Coins: pincer grasp to put into container and translation from finger tips to palm - Fine motor: small peg board, screw board  - Grip strength: 15# 8 medium beads  in vertical, 20# 8 medium beads in horizontal   PATIENT EDUCATION: Education details: Continue HEP Person educated: Patient Education method: Programmer, multimedia, Demonstration, Tactile cues, and Verbal cues Education comprehension: verbalized understanding and returned demonstration  HOME EXERCISE PROGRAM: Eval: Provided shoulder length edema glove/sleeve, educated on donning/doffing, wear and care; continue table slides from [facility] therapist 11/8: AA/ROM in supine 11/15: wrist stretch into extension  11/27: flexion glove wear 12/9: Wall Climbs 12/11: Towel Roll gripping and wringing out 12/20: A/ROM 12/30: shoulder stretch at wall  06/12/23: grasping and manipulating items such as cotton balls 06/20/23: scrub and carry protocol 1/23: Self Digit Blocking  GOALS: Goals reviewed with patient? Yes  SHORT TERM GOALS: Target date: 05/09/23  Pt will be provided with and educated on HEP to improve RUE functioning required for use during ADLs.   Goal status: IN PROGRESS  2.  Pt will increase P/ROM of right shoulder by 50+ degrees to improve ability to perform dressing and bathing tasks independently.   Goal status: IN PROGRESS  3.  Pt will increase A/ROM of right elbow to full extension to improve ability to  perform low level functional reaching tasks.   Goal status: IN PROGRESS  4.  Pt will decrease edema in RUE by 50% or greater to improve mobility in RUE required for active participation in bathing and grooming tasks.   Goal status: MET  5.  Pt will demonstrate ability to make at least 50% of a grasp to promote use when grasping large items such as a cup or water bottle.   Goal status: IN PROGRESS   LONG TERM GOALS: Target date: 06/09/23  Pt will decrease pain in the RUE to 2/10 or less to improve ability to sleep for 3+ consecutive hours without waking due to pain.   Goal status: IN PROGRESS  2.  Pt will increase RUE A/ROM to at least 120 degrees shoulder  flexion/abduction, and wrist extension by 25+ degrees to improve use of RUE during meal preparation tasks.   Goal status: IN PROGRESS  3.  Pt will increase RUE strength to 4-/5 or greater to improve ability to use RUE during housework tasks.   Goal status: IN PROGRESS  4.  Pt will increase grip strength to at least 20# and pinch strength to at least 5# to improve ability to grasp and carry groceries.   Goal status: IN PROGRESS  5.  Pt will decrease RUE fascial restrictions to min amounts or less to improve ability to perform functional reaching tasks.   Goal status: IN PROGRESS   ASSESSMENT:   CLINICAL IMPRESSION: This session pt worked on improving grip and forearm strength, as she feels her overall strength continues to be very weak. Her strength with the gripper continues to be the same at 20# over the past few sessions, despite adding new exercises to target strength. OT had pt work with eggsercizer this session, in order to demonstrate equipment she can use at home.Verbal and tactile cuing provided for positioning and technique throughout session.   PERFORMANCE DEFICITS: in functional skills including ADLs, IADLs, coordination, dexterity, proprioception, edema, ROM, strength, pain, fascial restrictions, Fine motor control, Gross motor control, and UE functional use    PLAN:  OT FREQUENCY: 2x/week  OT DURATION: 8 weeks  PLANNED INTERVENTIONS: 97168 OT Re-evaluation, 97535 self care/ADL training, 82956 therapeutic exercise, 97530 therapeutic activity, 97140 manual therapy, 97035 ultrasound, 97018 paraffin, 21308 moist heat, 97010 cryotherapy, 97032 electrical stimulation (manual), 97014 electrical stimulation unattended, 97760 Splinting (initial encounter), and DME and/or AE instructions  CONSULTED AND AGREED WITH PLAN OF CARE: Patient  PLAN FOR NEXT SESSION: retrograde massage and manual techniques, A/ROM, functional reaching. Follow up on HEP; adjust flexion glove as able.  Paraffin prn; continue with scrub and carry   Trish Mage, OTR/L [phone] 08/01/2023, 9:09 AM

## 2023-08-03 ENCOUNTER — Encounter (HOSPITAL_COMMUNITY): Payer: Self-pay | Admitting: Occupational Therapy

## 2023-08-03 ENCOUNTER — Ambulatory Visit (HOSPITAL_COMMUNITY): Payer: No Typology Code available for payment source | Admitting: Occupational Therapy

## 2023-08-03 DIAGNOSIS — M25641 Stiffness of right hand, not elsewhere classified: Secondary | ICD-10-CM

## 2023-08-03 DIAGNOSIS — M25621 Stiffness of right elbow, not elsewhere classified: Secondary | ICD-10-CM

## 2023-08-03 DIAGNOSIS — M25611 Stiffness of right shoulder, not elsewhere classified: Secondary | ICD-10-CM

## 2023-08-03 DIAGNOSIS — R29898 Other symptoms and signs involving the musculoskeletal system: Secondary | ICD-10-CM

## 2023-08-03 DIAGNOSIS — M25511 Pain in right shoulder: Secondary | ICD-10-CM | POA: Diagnosis not present

## 2023-08-03 NOTE — Therapy (Signed)
 OUTPATIENT OCCUPATIONAL THERAPY ORTHO TREATMENT   Patient Name: Stephanie Ramos MRN: [id number] DOB:12-11-1945, 78 y.o., female Today's Date: 08/03/2023    END OF SESSION:  OT End of Session - 08/03/23 1424     Visit Number 36    Number of Visits 37    Date for OT Re-Evaluation 08/07/23    Authorization Type [employer], copay: $15    Authorization Time Period no auth required    OT Start Time 1350    OT Stop Time 1428    OT Time Calculation (min) 38 min    Activity Tolerance Patient tolerated treatment well    Behavior During Therapy WFL for tasks assessed/performed               Past Medical History:  Diagnosis Date   High iron content of liver determined by magnetic resonance imaging 08/02/2021   Hyperlipemia    Past Surgical History:  Procedure Laterality Date   BREAST SURGERY     CATARACT EXTRACTION W/PHACO Left 08/11/2019   Procedure: CATARACT EXTRACTION PHACO AND INTRAOCULAR LENS PLACEMENT (IOC) (CDE: 9.68);  Surgeon: [name], MD;  Location: [facility];  Service: Ophthalmology;  Laterality: Left;   CATARACT EXTRACTION W/PHACO Right 09/29/2019   Procedure: CATARACT EXTRACTION PHACO AND INTRAOCULAR LENS PLACEMENT RIGHT EYE;  Surgeon: [name], MD;  Location: [facility];  Service: Ophthalmology;  Laterality: Right;  CDE: 10.60   DILITATION & CURRETTAGE/HYSTROSCOPY WITH ESSURE     Patient Active Problem List   Diagnosis Date Noted   Comminuted fracture of right humerus 03/29/2023   Preventative health care 03/10/2022   Hypercalcemia 09/08/2021   High iron content of liver determined by magnetic resonance imaging 08/02/2021   Hyperkalemia 07/21/2021   Elevated ferritin level 06/24/2021   Fatigue 05/20/2021   Iron deficiency 01/18/2021   Elevated BP without diagnosis of hypertension 01/18/2021   Overweight with body mass index (BMI) of 26 to 26.9 in adult 03/18/2020   Refused influenza vaccine 03/18/2020   Vertigo 12/28/2017   Hyperlipemia  12/28/2017   PCP: Dr. Christel Mormon REFERRING PROVIDER: Dr. Teryl Lucy  ONSET DATE: 02/15/23  REFERRING DIAG: rt prox humerus fx with regional pain syndrome of right upper etremity   THERAPY DIAG:  Acute pain of right shoulder  Stiffness of right hand, not elsewhere classified  Stiffness of right shoulder, not elsewhere classified  Stiffness of right elbow, not elsewhere classified  Other symptoms and signs involving the musculoskeletal system  Rationale for Evaluation and Treatment: Rehabilitation  SUBJECTIVE:   SUBJECTIVE STATEMENT: S: "I still get things done, its just complicated"   PERTINENT HISTORY: Pt is a 78 y/o female presenting s/p right proximal humerus fracture after falling when [place] in the woods on 02/15/23. Pt attended therapy at [facility] approximately 4 times before transferring to this clinic.   PRECAUTIONS: Shoulder  WEIGHT BEARING RESTRICTIONS: Yes NWB  PAIN:  Are you having pain? No  FALLS: Has patient fallen in last 6 months? Yes. Number of falls 1  PLOF: Independent  PATIENT GOALS: To get the right arm moving again.   NEXT MD VISIT: 6 weeks from 06/11/23  OBJECTIVE:  Note: Objective measures were completed at Evaluation unless otherwise noted.  HAND DOMINANCE: Right  ADLs: Overall ADLs: Pt is unable to use the RUE for ADLs. She can hold paper plate between her fingers. She is unable to make a fist or use her hand and arm. Pt is using the LUE for all tasks, using hemi-dressing techniques  and strategies for independence.    FUNCTIONAL OUTCOME MEASURES: Quick Dash: 72.73 06/08/23: 18.18  UPPER EXTREMITY ROM:       Assessed in sitting, er/IR adducted  Passive ROM Right eval Right 05/07/23 Right 06/08/23 Right  07/20/23  Shoulder flexion 61 84 88 105  Shoulder abduction 54 66 77 103  Shoulder internal rotation 90 90 90 90  Shoulder external rotation 19 28 28  40  Elbow flexion 136 145 145 145  Elbow extension -30 -5 -5 -5   Wrist flexion 55 70 75 80  Wrist extension 8 22 48 70  Wrist ulnar deviation 25   10  Wrist radial deviation 8 11 15  60  Wrist pronation 85 90    Wrist supination 85 90    (Blank rows = not tested)   Assessed in sitting, er/IR adducted  Active ROM Right eval Right 05/07/23 Right 06/08/23 Right  2/14  Shoulder flexion 33 68 81 90  Shoulder abduction 41 56 71 88  Shoulder internal rotation 90 90 90 90  Shoulder external rotation 20 34 35 65  Elbow flexion 128 142 146 160  Elbow extension -45 -10 -10 -10  Wrist flexion 55 60 62 62  Wrist extension 8 12 30  33  Wrist ulnar deviation 25     Wrist radial deviation 4 5 8 14   Wrist pronation 70 90 90 90  Wrist supination 80 88 88 90  (Blank rows = not tested)      Active ROM Right 05/07/23 Right 06/08/23 Right 2/14  Thumb MCP (0-60) 34 36 46  Thumb IP (0-80) 12 26 40  Index MCP (0-90) 50 50 58  Index PIP (0-100) 52 64 68  Index DIP (0-70)  12 20 25   Long MCP (0-90)  42 48 55  Long PIP (0-100)  40 52 70  Long DIP (0-70)  0 0 7  Ring MCP (0-90)  30 44 62  Ring PIP (0-100)  34 54 59  Ring DIP (0-70)  0 0 5  Little MCP (0-90)  32 32 60  Little PIP (0-100)  36 52 70  Little DIP (0-70)  0 0 13  (Blank rows = not tested)  UPPER EXTREMITY MMT:       Unable to assess due to pain, limited active movement   12/2: shoulder evaluated on observation; MMT for elbow and wrist  MMT Right eval Right 06/08/23 Right  07/20/23  Shoulder flexion 3-/5 3-/5 4/5  Shoulder abduction 3-/5 3-/5 3/5  Shoulder internal rotation 3/5 3/5 4+/5  Shoulder external rotation 3/5 3/5 4+/5  Elbow flexion 4/5 4+/5 5/5  Elbow extension 4-/5 4-/5 5/5  Wrist flexion 3+/5 4/5 5/5  Wrist extension 3+/5 4-/5 5/5  Wrist ulnar deviation 4/5 4/5 4+/5  Wrist radial deviation 3+/5 4/5 4+/5  Wrist pronation 4-/5 4+/5 5/5  Wrist supination 4-/5 4+/5 5/5   (Blank rows = not tested)  HAND FUNCTION: Unable, TBD   COORDINATION: Unable,  TBD  SENSATION: WFL  EDEMA:  -Right  -Left Upper arm    32cm 30.5cm Elbow           29cm 24cm Mid-forearm 20cm 20.5cm Wrist            17cm 14.5cm Palm            18.5cm 17cm  05/07/23 -Right   Upper arm    31cm  Elbow           25.5cm Mid-forearm 20cm  Wrist  15.5cm  Palm            17.5cm   06/08/23 -Right   Upper arm    30.5cm  Elbow           26.5cm Mid-forearm  20.75cm  Wrist            15.5cm  Palm            17.25cm  OBSERVATIONS: pt with generalized edema throughout RUE; joint stiffness in digits, wrist, elbow, and shoulder. Consistent with possibility of developing CRPS noting dx of regional pain syndrome of RUE   TODAY'S TREATMENT:                                                                                                                              DATE:   08/03/23 -Pulleys: flexion, abduction 60' each  -Wrist strengthening: 2 lb DB, wrist flexion, wrist extension   -Theraputty: yellow putty, roll into a ball, flatten into a pancake, roll into a ball, tripod pinch x10, lateral pinch x10 -Gripper: black gripper squeezing for 1 minute 2x -Pinch tree: red, green, blue, reaching into flexion, middle and thumb tip to tip pinch up, modified tripod pinch (3rd/4th digits to thumb), x7 each clip  07/31/23 -Theraputty: yellow putty, roll into a ball, flatten into a pancake, roll into a ball, tripod pinch x10, lateral pinch x10 -Gripper: 22# attempted both vertical and horizontal for 5', switched to 20# vertical 8 medium beads, attempted 25# unable to fully squeeze -Green Eggsercizer squeeze with emphasis on fingers x10 -Pinch tree: red, green, blue, reaching into flexion, middle and thumb tip to tip pinch up, modified tripod pinch (3rd/4th digits to thumb), x7 each clip, using green clip to pick up small foam blocks 10x   07/27/23 -Digit ROM: composite flexion, abduction, opposition, finger taps, x12 each -MCP Stretches: flexion, extension, 2 x15" -Wrist  strengthening: 2#, flexion, extension, ulnar/radial deviation, supination/pronation, x12 -Wrist ABC's -Gripper: 20# vertical 8 medium beads, 2 rounds -UBE: level 3, 2.5' forwards and backwards   PATIENT EDUCATION: Education details: Continue HEP Person educated: Patient Education method: Explanation, Demonstration, Tactile cues, and Verbal cues Education comprehension: verbalized understanding and returned demonstration  HOME EXERCISE PROGRAM: Eval: Provided shoulder length edema glove/sleeve, educated on donning/doffing, wear and care; continue table slides from [facility] therapist 11/8: AA/ROM in supine 11/15: wrist stretch into extension  11/27: flexion glove wear 12/9: Wall Climbs 12/11: Towel Roll gripping and wringing out 12/20: A/ROM 12/30: shoulder stretch at wall  06/12/23: grasping and manipulating items such as cotton balls 06/20/23: scrub and carry protocol 1/23: Self Digit Blocking  GOALS: Goals reviewed with patient? Yes  SHORT TERM GOALS: Target date: 05/09/23  Pt will be provided with and educated on HEP to improve RUE functioning required for use during ADLs.   Goal status: IN PROGRESS  2.  Pt will increase P/ROM of right shoulder by 50+ degrees to improve ability to perform dressing and bathing  tasks independently.   Goal status: IN PROGRESS  3.  Pt will increase A/ROM of right elbow to full extension to improve ability to perform low level functional reaching tasks.   Goal status: IN PROGRESS  4.  Pt will decrease edema in RUE by 50% or greater to improve mobility in RUE required for active participation in bathing and grooming tasks.   Goal status: MET  5.  Pt will demonstrate ability to make at least 50% of a grasp to promote use when grasping large items such as a cup or water bottle.   Goal status: IN PROGRESS   LONG TERM GOALS: Target date: 06/09/23  Pt will decrease pain in the RUE to 2/10 or less to improve ability to sleep for 3+  consecutive hours without waking due to pain.   Goal status: IN PROGRESS  2.  Pt will increase RUE A/ROM to at least 120 degrees shoulder flexion/abduction, and wrist extension by 25+ degrees to improve use of RUE during meal preparation tasks.   Goal status: IN PROGRESS  3.  Pt will increase RUE strength to 4-/5 or greater to improve ability to use RUE during housework tasks.   Goal status: IN PROGRESS  4.  Pt will increase grip strength to at least 20# and pinch strength to at least 5# to improve ability to grasp and carry groceries.   Goal status: IN PROGRESS  5.  Pt will decrease RUE fascial restrictions to min amounts or less to improve ability to perform functional reaching tasks.   Goal status: IN PROGRESS   ASSESSMENT:   CLINICAL IMPRESSION: Pt continuing to demonstrate the same function and strength with RUE. Discussed next session being the last session due to end of certification period and plateau in progress- pt agreed to this plan and verbalized understanding. Continued with strengthening wrist, grip, and pinch. Last session will be next week.    PERFORMANCE DEFICITS: in functional skills including ADLs, IADLs, coordination, dexterity, proprioception, edema, ROM, strength, pain, fascial restrictions, Fine motor control, Gross motor control, and UE functional use    PLAN:  OT FREQUENCY: 2x/week  OT DURATION: 8 weeks  PLANNED INTERVENTIONS: 97168 OT Re-evaluation, 97535 self care/ADL training, 16109 therapeutic exercise, 97530 therapeutic activity, 97140 manual therapy, 97035 ultrasound, 97018 paraffin, 60454 moist heat, 97010 cryotherapy, 97032 electrical stimulation (manual), 97014 electrical stimulation unattended, 97760 Splinting (initial encounter), and DME and/or AE instructions  CONSULTED AND AGREED WITH PLAN OF CARE: Patient  PLAN FOR NEXT SESSION: retrograde massage and manual techniques, A/ROM, functional reaching. Follow up on HEP; adjust flexion glove  as able. Paraffin prn; continue with scrub and carry   Lurena Joiner, OTR/L [phone] 08/03/2023, 2:27 PM

## 2023-08-07 ENCOUNTER — Ambulatory Visit (HOSPITAL_COMMUNITY): Payer: No Typology Code available for payment source | Attending: Orthopedic Surgery | Admitting: Occupational Therapy

## 2023-08-07 ENCOUNTER — Encounter (HOSPITAL_COMMUNITY): Payer: Self-pay | Admitting: Occupational Therapy

## 2023-08-07 DIAGNOSIS — M25641 Stiffness of right hand, not elsewhere classified: Secondary | ICD-10-CM | POA: Insufficient documentation

## 2023-08-07 DIAGNOSIS — M25611 Stiffness of right shoulder, not elsewhere classified: Secondary | ICD-10-CM | POA: Insufficient documentation

## 2023-08-07 DIAGNOSIS — M25621 Stiffness of right elbow, not elsewhere classified: Secondary | ICD-10-CM | POA: Diagnosis not present

## 2023-08-07 DIAGNOSIS — R29898 Other symptoms and signs involving the musculoskeletal system: Secondary | ICD-10-CM | POA: Insufficient documentation

## 2023-08-07 DIAGNOSIS — M25511 Pain in right shoulder: Secondary | ICD-10-CM | POA: Insufficient documentation

## 2023-08-07 NOTE — Therapy (Unsigned)
 OUTPATIENT OCCUPATIONAL THERAPY ORTHO TREATMENT DISCHARGE NOTE   Patient Name: Stephanie Ramos MRN: [id number] DOB:July 01, 1945, 78 y.o., female Today's Date: 08/08/2023  OCCUPATIONAL THERAPY DISCHARGE SUMMARY  Visits from Start of Care: 37  Current functional level related to goals / functional outcomes: Pt has met 8 out of 10 OT goals and partially met 1 out of 10 OT goals. Her strength is overall 5/5 with a few motions of 4+/5, her grasp has improved past 50% of a full fist.   Remaining deficits: Pt continues to have significantly limited shoulder ROM.    Education / Equipment: Pt has been provided a comprehensive HEP.    Plan: Patient agrees to discharge as most OT goals have been met and progress has plateaued.     END OF SESSION:  OT End of Session - 08/07/23 1349     Visit Number 37    Number of Visits 37    Date for OT Re-Evaluation 08/07/23    Authorization Type [employer], copay: $15    Authorization Time Period no auth required    OT Start Time 1306    OT Stop Time 1349    OT Time Calculation (min) 43 min    Activity Tolerance Patient tolerated treatment well    Behavior During Therapy WFL for tasks assessed/performed                Past Medical History:  Diagnosis Date   High iron content of liver determined by magnetic resonance imaging 08/02/2021   Hyperlipemia    Past Surgical History:  Procedure Laterality Date   BREAST SURGERY     CATARACT EXTRACTION W/PHACO Left 08/11/2019   Procedure: CATARACT EXTRACTION PHACO AND INTRAOCULAR LENS PLACEMENT (IOC) (CDE: 9.68);  Surgeon: [name], MD;  Location: [facility];  Service: Ophthalmology;  Laterality: Left;   CATARACT EXTRACTION W/PHACO Right 09/29/2019   Procedure: CATARACT EXTRACTION PHACO AND INTRAOCULAR LENS PLACEMENT RIGHT EYE;  Surgeon: [name], MD;  Location: [facility];  Service: Ophthalmology;  Laterality: Right;  CDE: 10.60   DILITATION & CURRETTAGE/HYSTROSCOPY WITH ESSURE      Patient Active Problem List   Diagnosis Date Noted   Comminuted fracture of right humerus 03/29/2023   Preventative health care 03/10/2022   Hypercalcemia 09/08/2021   High iron content of liver determined by magnetic resonance imaging 08/02/2021   Hyperkalemia 07/21/2021   Elevated ferritin level 06/24/2021   Fatigue 05/20/2021   Iron deficiency 01/18/2021   Elevated BP without diagnosis of hypertension 01/18/2021   Overweight with body mass index (BMI) of 26 to 26.9 in adult 03/18/2020   Refused influenza vaccine 03/18/2020   Vertigo 12/28/2017   Hyperlipemia 12/28/2017   PCP: Dr. Christel Mormon REFERRING PROVIDER: Dr. Teryl Lucy  ONSET DATE: 02/15/23  REFERRING DIAG: rt prox humerus fx with regional pain syndrome of right upper etremity   THERAPY DIAG:  Acute pain of right shoulder  Stiffness of right hand, not elsewhere classified  Stiffness of right shoulder, not elsewhere classified  Stiffness of right elbow, not elsewhere classified  Other symptoms and signs involving the musculoskeletal system  Rationale for Evaluation and Treatment: Rehabilitation  SUBJECTIVE:   SUBJECTIVE STATEMENT: S: "I still get things done, its just complicated"   PERTINENT HISTORY: Pt is a 78 y/o female presenting s/p right proximal humerus fracture after falling when [place] up in the woods on 02/15/23. Pt attended therapy at [facility] approximately 4 times before transferring to this clinic.   PRECAUTIONS: Shoulder  WEIGHT BEARING  RESTRICTIONS: Yes NWB  PAIN:  Are you having pain? No  FALLS: Has patient fallen in last 6 months? Yes. Number of falls 1  PLOF: Independent  PATIENT GOALS: To get the right arm moving again.   NEXT MD VISIT: 6 weeks from 06/11/23  OBJECTIVE:  Note: Objective measures were completed at Evaluation unless otherwise noted.  HAND DOMINANCE: Right  ADLs: Overall ADLs: Pt is unable to use the RUE for ADLs. She can hold paper plate between her  fingers. She is unable to make a fist or use her hand and arm. Pt is using the LUE for all tasks, using hemi-dressing techniques and strategies for independence.    FUNCTIONAL OUTCOME MEASURES: Quick Dash: 72.73 06/08/23: 18.18  UPPER EXTREMITY ROM:       Assessed in sitting, er/IR adducted  Passive ROM Right eval Right 05/07/23 Right 06/08/23 Right  07/20/23  Shoulder flexion 61 84 88 105  Shoulder abduction 54 66 77 103  Shoulder internal rotation 90 90 90 90  Shoulder external rotation 19 28 28  40  Elbow flexion 136 145 145 145  Elbow extension -30 -5 -5 -5  Wrist flexion 55 70 75 80  Wrist extension 8 22 48 70  Wrist ulnar deviation 25   10  Wrist radial deviation 8 11 15  60  Wrist pronation 85 90    Wrist supination 85 90    (Blank rows = not tested)   Assessed in sitting, er/IR adducted  Active ROM Right eval Right 05/07/23 Right 06/08/23 Right  2/14 Right 08/07/23  Shoulder flexion 33 68 81 90 92  Shoulder abduction 41 56 71 88 90  Shoulder internal rotation 90 90 90 90 90  Shoulder external rotation 20 34 35 65 65  Elbow flexion 128 142 146 160 155  Elbow extension -45 -10 -10 -10 -4  Wrist flexion 55 60 62 62 70  Wrist extension 8 12 30  33 34  Wrist ulnar deviation 25    36  Wrist radial deviation 4 5 8 14 13   Wrist pronation 70 90 90 90 90  Wrist supination 80 88 88 90 90  (Blank rows = not tested)      Active ROM Right 05/07/23 Right 06/08/23 Right 2/14 Right 08/07/23  Thumb MCP (0-60) 34 36 46 40  Thumb IP (0-80) 12 26 40 35  Index MCP (0-90) 50 50 58 55  Index PIP (0-100) 52 64 68 80  Index DIP (0-70)  12 20 25 25   Long MCP (0-90)  42 48 55 50  Long PIP (0-100)  40 52 70 65  Long DIP (0-70)  0 0 7 5  Ring MCP (0-90)  30 44 62 55  Ring PIP (0-100)  34 54 59 55  Ring DIP (0-70)  0 0 5 10  Little MCP (0-90)  32 32 60 50  Little PIP (0-100)  36 52 70 65  Little DIP (0-70)  0 0 13 15  (Blank rows = not tested)  UPPER EXTREMITY MMT:       Unable to  assess due to pain, limited active movement   12/2: shoulder evaluated on observation; MMT for elbow and wrist  MMT Right eval Right 06/08/23 Right  07/20/23 Right 08/07/23  Shoulder flexion 3-/5 3-/5 4/5 5/5  Shoulder abduction 3-/5 3-/5 3/5 4+/5  Shoulder internal rotation 3/5 3/5 4+/5 5/5  Shoulder external rotation 3/5 3/5 4+/5 4+/5  Elbow flexion 4/5 4+/5 5/5 5/5  Elbow extension 4-/5  4-/5 5/5 5/5  Wrist flexion 3+/5 4/5 5/5 5/5  Wrist extension 3+/5 4-/5 5/5 5/5  Wrist ulnar deviation 4/5 4/5 4+/5 5/5  Wrist radial deviation 3+/5 4/5 4+/5 5/5  Wrist pronation 4-/5 4+/5 5/5 5/5  Wrist supination 4-/5 4+/5 5/5  5/5  (Blank rows = not tested)  HAND FUNCTION: Unable, TBD  08/07/23: Grip Strength: Right: 17 lbs Left: 45 lbs ; Lateral Pinch Strength: Right: 10 lbs Left: 13 lbs; Tripod Pinch Strength: Right: 7 lbs Left: 10 lbs  COORDINATION: Unable, TBD 08/07/23: 9 Hole Peg Test: Right: 37.08 sec, Left: 21.07 sec  SENSATION: WFL  EDEMA:  -Right  -Left Upper arm    32cm 30.5cm Elbow           29cm 24cm Mid-forearm 20cm 20.5cm Wrist            17cm 14.5cm Palm            18.5cm 17cm  05/07/23 -Right   Upper arm    31cm  Elbow           25.5cm Mid-forearm 20cm  Wrist            15.5cm  Palm            17.5cm   06/08/23 -Right   Upper arm    30.5cm  Elbow           26.5cm Mid-forearm  20.75cm  Wrist            15.5cm  Palm            17.25cm  08/07/23 -Right   Upper arm    30.5cm  Elbow           25.5cm Mid-forearm  20.0cm  Wrist            15.0cm  Palm            17.25cm  OBSERVATIONS: pt with generalized edema throughout RUE; joint stiffness in digits, wrist, elbow, and shoulder. Consistent with possibility of developing CRPS noting dx of regional pain syndrome of RUE   TODAY'S TREATMENT:                                                                                                                              DATE:   08/07/23 -UBE: level 2, 2.5' forwards and  backwards, pace: 3.0+ -Pulleys: flexion, abduction, 60" each -Wrist ROM: flexion, extension, ulnar/radial deviation, supination/pronation, x10 -Digit ROM: flexion, abduction, opposition, finger taps, x10  -Measurements for reassessment -9 Hole Peg test  08/03/23 -Pulleys: flexion, abduction 60' each  -Wrist strengthening: 2 lb DB, wrist flexion, wrist extension   -Theraputty: yellow putty, roll into a ball, flatten into a pancake, roll into a ball, tripod pinch x10, lateral pinch x10 -Gripper: black gripper squeezing for 1 minute 2x -Pinch tree: red, green, blue, reaching into flexion, middle and thumb tip to tip pinch up, modified tripod pinch (3rd/4th digits to  thumb), x7 each clip  07/31/23 -Theraputty: yellow putty, roll into a ball, flatten into a pancake, roll into a ball, tripod pinch x10, lateral pinch x10 -Gripper: 22# attempted both vertical and horizontal for 5', switched to 20# vertical 8 medium beads, attempted 25# unable to fully squeeze -Green Eggsercizer squeeze with emphasis on fingers x10 -Pinch tree: red, green, blue, reaching into flexion, middle and thumb tip to tip pinch up, modified tripod pinch (3rd/4th digits to thumb), x7 each clip, using green clip to pick up small foam blocks 10x    PATIENT EDUCATION: Education details: Continue HEP Person educated: Patient Education method: Programmer, multimedia, Demonstration, Tactile cues, and Verbal cues Education comprehension: verbalized understanding and returned demonstration  HOME EXERCISE PROGRAM: Eval: Provided shoulder length edema glove/sleeve, educated on donning/doffing, wear and care; continue table slides from Murphy-Wainer therapist 11/8: AA/ROM in supine 11/15: wrist stretch into extension  11/27: flexion glove wear 12/9: Wall Climbs 12/11: Towel Roll gripping and wringing out 12/20: A/ROM 12/30: shoulder stretch at wall  06/12/23: grasping and manipulating items such as cotton balls 06/20/23: scrub and carry  protocol 1/23: Self Digit Blocking  GOALS: Goals reviewed with patient? Yes  SHORT TERM GOALS: Target date: 05/09/23  Pt will be provided with and educated on HEP to improve RUE functioning required for use during ADLs.   Goal status: MET  2.  Pt will increase P/ROM of right shoulder by 50+ degrees to improve ability to perform dressing and bathing tasks independently.   Goal status: MET  3.  Pt will increase A/ROM of right elbow to full extension to improve ability to perform low level functional reaching tasks.   Goal status: MET  4.  Pt will decrease edema in RUE by 50% or greater to improve mobility in RUE required for active participation in bathing and grooming tasks.   Goal status: MET  5.  Pt will demonstrate ability to make at least 50% of a grasp to promote use when grasping large items such as a cup or water bottle.   Goal status: MET   LONG TERM GOALS: Target date: 06/09/23  Pt will decrease pain in the RUE to 2/10 or less to improve ability to sleep for 3+ consecutive hours without waking due to pain.   Goal status: MET  2.  Pt will increase RUE A/ROM to at least 120 degrees shoulder flexion/abduction, and wrist extension by 25+ degrees to improve use of RUE during meal preparation tasks.   Goal status: NOT MET  3.  Pt will increase RUE strength to 4-/5 or greater to improve ability to use RUE during housework tasks.   Goal status: MET  4.  Pt will increase grip strength to at least 20# and pinch strength to at least 5# to improve ability to grasp and carry groceries.   Goal status: PARTIALLY MET  5.  Pt will decrease RUE fascial restrictions to min amounts or less to improve ability to perform functional reaching tasks.   Goal status: MET   ASSESSMENT:   CLINICAL IMPRESSION: This session pt completed her reassessment and elected to discharge from OT, as her progress has plateaued and she feels that she can complete entire HEP independently at home for  maintenance. OT and pt reviewed her HEP, pt demonstrating good movement pattern within her abilities. ROM continues to be limited to approximately 50% of full ROM in both the shoulder and hand/digits. She is able to complete a gross grasp and pinch at this time, as well  as fine motor coordination, that she previously was unable to complete. Pt has no further skilled OT needs at this time and will discharge from acute OT.   PERFORMANCE DEFICITS: in functional skills including ADLs, IADLs, coordination, dexterity, proprioception, edema, ROM, strength, pain, fascial restrictions, Fine motor control, Gross motor control, and UE functional use    PLAN:  OT FREQUENCY: 2x/week  OT DURATION: 8 weeks  PLANNED INTERVENTIONS: 97168 OT Re-evaluation, 97535 self care/ADL training, 01027 therapeutic exercise, 97530 therapeutic activity, 97140 manual therapy, 97035 ultrasound, 97018 paraffin, 25366 moist heat, 97010 cryotherapy, 97032 electrical stimulation (manual), 97014 electrical stimulation unattended, 97760 Splinting (initial encounter), and DME and/or AE instructions  CONSULTED AND AGREED WITH PLAN OF CARE: Patient  PLAN FOR NEXT SESSION: Discharge   Trish Mage, OTR/L [phone] 08/08/2023, 10:51 AM

## 2023-08-10 ENCOUNTER — Encounter (HOSPITAL_COMMUNITY): Payer: No Typology Code available for payment source | Admitting: Occupational Therapy

## 2023-08-14 ENCOUNTER — Encounter (HOSPITAL_COMMUNITY): Payer: No Typology Code available for payment source | Admitting: Occupational Therapy

## 2023-08-16 ENCOUNTER — Encounter (HOSPITAL_COMMUNITY): Payer: No Typology Code available for payment source | Admitting: Occupational Therapy

## 2023-08-21 ENCOUNTER — Encounter (HOSPITAL_COMMUNITY): Payer: No Typology Code available for payment source | Admitting: Occupational Therapy

## 2023-08-23 ENCOUNTER — Other Ambulatory Visit: Payer: Self-pay

## 2023-08-23 ENCOUNTER — Encounter (HOSPITAL_COMMUNITY): Payer: No Typology Code available for payment source | Admitting: Occupational Therapy

## 2023-08-23 DIAGNOSIS — R7989 Other specified abnormal findings of blood chemistry: Secondary | ICD-10-CM

## 2023-08-24 ENCOUNTER — Inpatient Hospital Stay: Payer: No Typology Code available for payment source | Attending: Hematology

## 2023-08-24 DIAGNOSIS — R7989 Other specified abnormal findings of blood chemistry: Secondary | ICD-10-CM | POA: Insufficient documentation

## 2023-08-24 LAB — CBC WITH DIFFERENTIAL/PLATELET
Abs Immature Granulocytes: 0.02 10*3/uL (ref 0.00–0.07)
Basophils Absolute: 0.1 10*3/uL (ref 0.0–0.1)
Basophils Relative: 1 %
Eosinophils Absolute: 0.1 10*3/uL (ref 0.0–0.5)
Eosinophils Relative: 1 %
HCT: 44.8 % (ref 36.0–46.0)
Hemoglobin: 14.1 g/dL (ref 12.0–15.0)
Immature Granulocytes: 0 %
Lymphocytes Relative: 25 %
Lymphs Abs: 1.5 10*3/uL (ref 0.7–4.0)
MCH: 30 pg (ref 26.0–34.0)
MCHC: 31.5 g/dL (ref 30.0–36.0)
MCV: 95.3 fL (ref 80.0–100.0)
Monocytes Absolute: 0.5 10*3/uL (ref 0.1–1.0)
Monocytes Relative: 8 %
Neutro Abs: 4.1 10*3/uL (ref 1.7–7.7)
Neutrophils Relative %: 65 %
Platelets: 358 10*3/uL (ref 150–400)
RBC: 4.7 MIL/uL (ref 3.87–5.11)
RDW: 12.8 % (ref 11.5–15.5)
WBC: 6.3 10*3/uL (ref 4.0–10.5)
nRBC: 0 % (ref 0.0–0.2)

## 2023-08-24 LAB — FERRITIN: Ferritin: 40 ng/mL (ref 11–307)

## 2023-08-24 LAB — IRON AND TIBC
Iron: 62 ug/dL (ref 28–170)
Saturation Ratios: 15 % (ref 10.4–31.8)
TIBC: 403 ug/dL (ref 250–450)
UIBC: 341 ug/dL

## 2023-08-28 ENCOUNTER — Encounter (HOSPITAL_COMMUNITY): Payer: No Typology Code available for payment source | Admitting: Occupational Therapy

## 2023-08-30 ENCOUNTER — Encounter (HOSPITAL_COMMUNITY): Payer: No Typology Code available for payment source | Admitting: Occupational Therapy

## 2023-08-31 ENCOUNTER — Inpatient Hospital Stay: Payer: No Typology Code available for payment source | Admitting: Oncology

## 2023-08-31 DIAGNOSIS — R7989 Other specified abnormal findings of blood chemistry: Secondary | ICD-10-CM | POA: Diagnosis not present

## 2023-08-31 NOTE — Progress Notes (Signed)
 [facility] [address] [place] [address]    Clinic Day:  08/31/2023  Referring physician: Billie Lade, MD  Patient Care Team: Billie Lade, MD as PCP - General (Internal Medicine) Doreatha Massed, MD as Medical Oncologist (Hematology)   ASSESSMENT & PLAN:   Assessment: 1.  Elevated ferritin levels: - Patient was on iron tablet daily for many years, stopped in August 2022 when her ferritin was found to be elevated at 1609 with percent saturation 33. - She started taking woman's once a day vitamin which has iron in it. - Recent ferritin on 05/16/2021 was 1875.  She was told to stop taking woman's vitamin. - She denies any prior history of transfusion or parenteral iron therapy. - Hemochromatosis workup: Negative - MRI liver (07/27/2021): Hepatic iron deposition. - She was on intermittent phlebotomies, last phlebotomy on 04/24/2022.  2.  Social/family history: -She lives at home with her husband.  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 - No family history of hemochromatosis or malignancies.  Plan: 1.  Elevated ferritin levels: - Her last phlebotomy was on 05/01/2023 for ferritin of 119. - Labs from 08/24/23 show a ferritin of 40 (119) with an iron saturation of 15%.  Hemoglobin is 14.1. -We discussed goal ferritin is less than 100. - She does not need a phlebotomy at this time. -Return to clinic in 4 months with repeat lab work and see MD/APP for follow-up for few days later.  Orders Placed This Encounter  Procedures   CBC with Differential    Standing Status:   Future    Expected Date:   03/02/2024    Expiration Date:   08/30/2024   Ferritin    Standing Status:   Future    Expected Date:   03/02/2024    Expiration Date:   08/30/2024   Iron and TIBC ([facility] ONLY)    Standing Status:   Future    Expected Date:   03/02/2024    Expiration Date:   08/30/2024   PLAN SUMMARY: >> She does not  need a phlebotomy at this time. >> Return to clinic in 4 months for follow-up with labs a few days before.    I spent 20 minutes dedicated to the care of this patient (face-to-face and non-face-to-face) on the date of the encounter to include what is described in the assessment and plan.   Mauro Kaufmann, NP   3/28/202510:45 AM  CHIEF COMPLAINT:   Diagnosis: elevated ferritin   Prior Therapy: Intermittent phlebotomy  Current Therapy: Observation;intermittent phlebotomy.    INTERVAL HISTORY:   Stephanie Ramos is a 78 y.o. female presenting to clinic today for follow up of elevated ferritin. She was last in clinic on 04/27/23.   Due to signs of hepatic and reticuloendothelial iron deposition, she underwent phlebotomy therapy to reach goal of ferritin <100 in November 2023 and 2024.  Last ferritin was 119.  Tolerated phlebotomy well  Since her last visit, she has been followed closely by Occupational Therapy for right shoulder pain and stiffness which appears to be helping.  Today, she states that she is doing well overall.  Her appetite level is at 100%. Her energy level is at 100%. She denies any fevers, night sweats or weight loss.  No infections recently.  Reports she felt tired for about 6 hours following her phelbotomy but then recovered completely.  She denies any iron supplements or iron rich foods.  Reports she eats  only chicken and [place].   PAST MEDICAL HISTORY:   Past Medical History: Past Medical History:  Diagnosis Date   High iron content of liver determined by magnetic resonance imaging 08/02/2021   Hyperlipemia     Surgical History: Past Surgical History:  Procedure Laterality Date   BREAST SURGERY     CATARACT EXTRACTION W/PHACO Left 08/11/2019   Procedure: CATARACT EXTRACTION PHACO AND INTRAOCULAR LENS PLACEMENT (IOC) (CDE: 9.68);  Surgeon: [name], MD;  Location: [facility];  Service: Ophthalmology;  Laterality: Left;   CATARACT EXTRACTION W/PHACO Right  09/29/2019   Procedure: CATARACT EXTRACTION PHACO AND INTRAOCULAR LENS PLACEMENT RIGHT EYE;  Surgeon: [name], MD;  Location: [facility];  Service: Ophthalmology;  Laterality: Right;  CDE: 10.60   DILITATION & CURRETTAGE/HYSTROSCOPY WITH ESSURE      Social History: Social History   Socioeconomic History   Marital status: Married    Spouse name: Not on file   Number of children: 1   Years of education: Not on file   Highest education level: Not on file  Occupational History   Not on file  Tobacco Use   Smoking status: Never   Smokeless tobacco: Never  Substance and Sexual Activity   Alcohol use: Never   Drug use: Never   Sexual activity: Not on file  Other Topics Concern   Not on file  Social History Narrative   Lives with husband of 49 years   They have one child and one grandchild      She enjoys reading romance novels      Diet: all food groups   Caffeine: Limited   Water: 1/2 gallon or more a day      Wears seat belt   Does not use phone while driving   Psychologist, sport and exercise at home   Chartered certified accountant safe box    Social Drivers of Health   Financial Resource Strain: Low Risk  (10/09/2022)   Overall Financial Resource Strain (CARDIA)    Difficulty of Paying Living Expenses: Not hard at all  Food Insecurity: No Food Insecurity (10/09/2022)   Hunger Vital Sign    Worried About Running Out of Food in the Last Year: Never true    Ran Out of Food in the Last Year: Never true  Transportation Needs: No Transportation Needs (10/09/2022)   PRAPARE - Administrator, Civil Service (Medical): No    Lack of Transportation (Non-Medical): No  Physical Activity: Sufficiently Active (10/09/2022)   Exercise Vital Sign    Days of Exercise per Week: 5 days    Minutes of Exercise per Session: 30 min  Stress: No Stress Concern Present (10/09/2022)   [employer] of Occupational Health - Occupational Stress Questionnaire    Feeling of Stress : Not at all  Social  Connections: Moderately Integrated (10/09/2022)   Social Connection and Isolation Panel [NHANES]    Frequency of Communication with Friends and Family: More than three times a week    Frequency of Social Gatherings with Friends and Family: More than three times a week    Attends Religious Services: More than 4 times per year    Active Member of [employer] or Organizations: No    Attends Banker Meetings: Never    Marital Status: Married  Catering manager Violence: Not At Risk (10/09/2022)   Humiliation, Afraid, Rape, and Kick questionnaire    Fear of Current or Ex-Partner: No    Emotionally Abused: No  Physically Abused: No    Sexually Abused: No    Family History: No family history on file.  Current Medications:  Current Outpatient Medications:    Calcium Carb-Cholecalciferol 600-800 MG-UNIT TABS, Take 1 tablet by mouth in the morning and at bedtime., Disp: , Rfl:    cyanocobalamin 1000 MCG tablet, Take 1,000 mcg by mouth daily., Disp: , Rfl:    ezetimibe (ZETIA) 10 MG tablet, Take 1 tablet by mouth once daily, Disp: 90 tablet, Rfl: 0   meclizine (ANTIVERT) 25 MG tablet, Take 1 tablet (25 mg total) by mouth 3 (three) times daily as needed for dizziness., Disp: 30 tablet, Rfl: 2   omega-3 acid ethyl esters (LOVAZA) 1 g capsule, Take by mouth 2 (two) times daily., Disp: , Rfl:    oxyCODONE-acetaminophen (PERCOCET/ROXICET) 5-325 MG tablet, Take 1 tablet by mouth every 6 (six) hours as needed for severe pain., Disp: 20 tablet, Rfl: 0   simvastatin (ZOCOR) 40 MG tablet, Take 1 tablet (40 mg total) by mouth daily., Disp: 100 tablet, Rfl: 0   Vitamin A 2400 MCG (8000 UT) TABS, Take 8,000 Units by mouth daily., Disp: , Rfl:    Vitamin E 400 units TABS, Take 400 Units by mouth daily., Disp: , Rfl:    Allergies: Allergies  Allergen Reactions   Sulfa Antibiotics Rash    REVIEW OF SYSTEMS:   Review of Systems  Constitutional:  Positive for fatigue.     VITALS:   Blood  pressure 138/84, pulse 86, temperature 98.3 F (36.8 C), temperature source Oral, resp. rate 18, height 5\' 1"  (1.549 m), weight 140 lb 4.8 oz (63.6 kg), SpO2 98%.  Wt Readings from Last 3 Encounters:  08/31/23 140 lb 4.8 oz (63.6 kg)  04/27/23 140 lb 8 oz (63.7 kg)  03/29/23 144 lb 12.8 oz (65.7 kg)    Body mass index is 26.51 kg/m.  Performance status (ECOG): 1 - Symptomatic but completely ambulatory  PHYSICAL EXAM:   Physical Exam Constitutional:      Appearance: Normal appearance.  Cardiovascular:     Rate and Rhythm: Normal rate and regular rhythm.  Pulmonary:     Effort: Pulmonary effort is normal.     Breath sounds: Normal breath sounds.  Abdominal:     General: Bowel sounds are normal.     Palpations: Abdomen is soft.  Musculoskeletal:        General: No swelling. Normal range of motion.  Neurological:     Mental Status: She is alert and oriented to person, place, and time. Mental status is at baseline.     LABS:      Latest Ref Rng & Units 08/24/2023    9:59 AM 04/20/2023   10:01 AM 12/22/2022   12:28 PM  CBC  WBC 4.0 - 10.5 K/uL 6.3  6.5  5.8   Hemoglobin 12.0 - 15.0 g/dL 40.9  81.1  91.4   Hematocrit 36.0 - 46.0 % 44.8  46.3  43.4   Platelets 150 - 400 K/uL 358  392  345       Latest Ref Rng & Units 12/22/2022   12:28 PM 01/23/2022    1:07 PM 09/08/2021   11:15 AM  CMP  Glucose 70 - 99 mg/dL  97  782   BUN 8 - 23 mg/dL  13  16   Creatinine 9.56 - 1.00 mg/dL  2.13  0.86   Sodium 578 - 145 mmol/L  141  145   Potassium 3.5 - 5.1 mmol/L  4.1  4.4   Chloride 98 - 111 mmol/L  106  106   CO2 22 - 32 mmol/L  29  22   Calcium 8.9 - 10.3 mg/dL  9.4  9.7   Total Protein 6.5 - 8.1 g/dL 7.2  7.6    Total Bilirubin 0.3 - 1.2 mg/dL 0.6  0.3    Alkaline Phos 38 - 126 U/L 78  68    AST 15 - 41 U/L 35  25    ALT 0 - 44 U/L 40  29       No results found for: "CEA1", "CEA" / No results found for: "CEA1", "CEA" No results found for: "PSA1" No results found for:  "BJY782" No results found for: "CAN125"  No results found for: "TOTALPROTELP", "ALBUMINELP", "A1GS", "A2GS", "BETS", "BETA2SER", "GAMS", "MSPIKE", "SPEI" Lab Results  Component Value Date   TIBC 403 08/24/2023   TIBC 398 04/20/2023   TIBC 383 12/22/2022   FERRITIN 40 08/24/2023   FERRITIN 119 04/20/2023   FERRITIN 95 12/22/2022   IRONPCTSAT 15 08/24/2023   IRONPCTSAT 18 04/20/2023   IRONPCTSAT 18 12/22/2022   Lab Results  Component Value Date   LDH 174 05/20/2021     STUDIES:   No results found.

## 2023-09-17 DIAGNOSIS — S42294D Other nondisplaced fracture of upper end of right humerus, subsequent encounter for fracture with routine healing: Secondary | ICD-10-CM | POA: Diagnosis not present

## 2023-09-28 ENCOUNTER — Ambulatory Visit: Payer: No Typology Code available for payment source | Admitting: Internal Medicine

## 2023-10-02 ENCOUNTER — Other Ambulatory Visit: Payer: Self-pay | Admitting: Internal Medicine

## 2023-10-02 DIAGNOSIS — E7841 Elevated Lipoprotein(a): Secondary | ICD-10-CM

## 2023-10-16 ENCOUNTER — Ambulatory Visit: Payer: No Typology Code available for payment source

## 2023-10-16 VITALS — Ht 61.0 in | Wt 137.0 lb

## 2023-10-16 DIAGNOSIS — Z0000 Encounter for general adult medical examination without abnormal findings: Secondary | ICD-10-CM | POA: Diagnosis not present

## 2023-10-16 DIAGNOSIS — Z5320 Procedure and treatment not carried out because of patient's decision for unspecified reasons: Secondary | ICD-10-CM

## 2023-10-16 NOTE — Patient Instructions (Signed)
 [name] , Thank you for taking time out of your busy schedule to complete your Annual Wellness Visit with me. I enjoyed our conversation and look forward to speaking with you again next year. I, as well as your care team,  appreciate your ongoing commitment to your health goals. Please review the following plan we discussed and let me know if I can assist you in the future. Your Game plan/ To Do List    Referrals: If you haven't heard from the office you've been referred to, please reach out to them at the phone number provided.  N/a Follow up Visits: Next Medicare AWV with our clinical staff: Oct 20, 2024 at 10:00 am telephone visit   Have you seen your provider in the last 6 months (3 months if uncontrolled diabetes)? Yes Next Office Visit with your provider: Oct 26, 2023 at 3:00 pm  Clinician Recommendations:  Aim for 30 minutes of exercise or brisk walking, 6-8 glasses of water, and 5 servings of fruits and vegetables each day.  You are due for you bone density screening to check for osteoporosis. This is very important especially since you had the fall and broke your shoulder. If you change your mind about having this screening, please reach out to your doctor's office to get them to place the order for you.   Enjoy your family reunion and [place] vacation!!!      This is a list of the screening recommended for you and due dates:  Health Maintenance  Topic Date Due   Zoster (Shingles) Vaccine (1 of 2) Never done   DEXA scan (bone density measurement)  11/23/2018   DTaP/Tdap/Td vaccine (1 - Tdap) 03/28/2024*   Flu Shot  01/04/2024   Medicare Annual Wellness Visit  10/15/2024   Cologuard (Stool DNA test)  03/21/2025   Pneumonia Vaccine  Completed   Hepatitis C Screening  Completed   HPV Vaccine  Aged Out   Meningitis B Vaccine  Aged Out   COVID-19 Vaccine  Discontinued  *Topic was postponed. The date shown is not the original due date.    Advanced directives: (Declined)  Advance directive discussed with you today. Even though you declined this today, please call our office should you change your mind, and we can give you the proper paperwork for you to fill out. Advance Care Planning is important because it:  [x]  Makes sure you receive the medical care that is consistent with your values, goals, and preferences  [x]  It provides guidance to your family and loved ones and reduces their decisional burden about whether or not they are making the right decisions based on your wishes.  Follow the link provided in your after visit summary or read over the paperwork we have mailed to you to help you started getting your Advance Directives in place. If you need assistance in completing these, please reach out to us  so that we can help you!  See attachments for Preventive Care and Fall Prevention Tips.   Understanding Your Risk for Falls Millions of people have serious injuries from falls each year. It is important to understand your risk of falling. Talk with your health care provider about your risk and what you can do to lower it. If you do have a serious fall, make sure to tell your provider. Falling once raises your risk of falling again. How can falls affect me? Serious injuries from falls are common. These include: Broken bones, such as hip fractures. Head injuries, such as  traumatic brain injuries (TBI) or concussions. A fear of falling can cause you to avoid activities and stay at home. This can make your muscles weaker and raise your risk for a fall. What can increase my risk? There are a number of risk factors that increase your risk for falling. The more risk factors you have, the higher your risk of falling. Serious injuries from a fall happen most often to people who are older than 77 years old. Teenagers and young adults ages 58-29 are also at higher risk. Common risk factors include: Weakness in the lower body. Being generally weak or confused due to  long-term (chronic) illness. Dizziness or balance problems. Poor vision. Medicines that cause dizziness or drowsiness. These may include: Medicines for your blood pressure, heart, anxiety, insomnia, or swelling (edema). Pain medicines. Muscle relaxants. Other risk factors include: Drinking alcohol. Having had a fall in the past. Having foot pain or wearing improper footwear. Working at a dangerous job. Having any of the following in your home: Tripping hazards, such as floor clutter or loose rugs. Poor lighting. Pets. Having dementia or memory loss. What actions can I take to lower my risk of falling?     Physical activity Stay physically fit. Do strength and balance exercises. Consider taking a regular class to build strength and balance. Yoga and tai chi are good options. Vision Have your eyes checked every year and your prescription for glasses or contacts updated as needed. Shoes and walking aids Wear non-skid shoes. Wear shoes that have rubber soles and low heels. Do not wear high heels. Do not walk around the house in socks or slippers. Use a cane or walker as told by your provider. Home safety Attach secure railings on both sides of your stairs. Install grab bars for your bathtub, shower, and toilet. Use a non-skid mat in your bathtub or shower. Attach bath mats securely with double-sided, non-slip rug tape. Use good lighting in all rooms. Keep a flashlight near your bed. Make sure there is a clear path from your bed to the bathroom. Use night-lights. Do not use throw rugs. Make sure all carpeting is taped or tacked down securely. Remove all clutter from walkways and stairways, including extension cords. Repair uneven or broken steps and floors. Avoid walking on icy or slippery surfaces. Walk on the grass instead of on icy or slick sidewalks. Use ice melter to get rid of ice on walkways in the winter. Use a cordless phone. Questions to ask your health care  provider Can you help me check my risk for a fall? Do any of my medicines make me more likely to fall? Should I take a vitamin D  supplement? What exercises can I do to improve my strength and balance? Should I make an appointment to have my vision checked? Do I need a bone density test to check for weak bones (osteoporosis)? Would it help to use a cane or a walker? Where to find more information Centers for Disease Control and Prevention, STEADI: TonerPromos.no Community-Based Fall Prevention Programs: TonerPromos.no [employer] on Aging: BaseRingTones.pl Contact a health care provider if: You fall at home. You are afraid of falling at home. You feel weak, drowsy, or dizzy. This information is not intended to replace advice given to you by your health care provider. Make sure you discuss any questions you have with your health care provider. Document Revised: 01/23/2022 Document Reviewed: 01/23/2022 Elsevier Patient Education  2024 [employer].

## 2023-10-16 NOTE — Progress Notes (Signed)
 Please attest and cosign this visit due to patients primary care provider not being in the office at the time the visit was completed.   Subjective:   KHAZA ZIMPFER is a 78 y.o. who presents for a Medicare Wellness preventive visit.  Visit Complete: Virtual I connected with  Marijane Shoulders on 10/16/23 by a audio enabled telemedicine application and verified that I am speaking with the correct person using two identifiers.  Patient Location: Home  Provider Location: Home Office  I discussed the limitations of evaluation and management by telemedicine. The patient expressed understanding and agreed to proceed.  Vital Signs: Because this visit was a virtual/telehealth visit, some criteria may be missing or patient reported. Any vitals not documented were not able to be obtained and vitals that have been documented are patient reported.  VideoDeclined- This patient declined Librarian, academic. Therefore the visit was completed with audio only.  AWV Questionnaire: No: Patient Medicare AWV questionnaire was not completed prior to this visit.  Cardiac Risk Factors include: advanced age (>49men, >54 women);dyslipidemia     Objective:     Today's Vitals   10/16/23 1040  Weight: 137 lb (62.1 kg)  Height: 5\' 1"  (1.549 m)   Body mass index is 25.89 kg/m.     10/16/2023   10:52 AM 08/31/2023   10:34 AM 05/01/2023    3:41 PM 05/01/2023    3:25 PM 04/27/2023   11:21 AM 04/10/2023    1:51 PM 02/15/2023    5:49 PM  Advanced Directives  Does Patient Have a Medical Advance Directive? No No No No No No No  Would patient like information on creating a medical advance directive? No - Patient declined No - Patient declined No - Patient declined No - Patient declined No - Patient declined No - Patient declined     Current Medications (verified) Outpatient Encounter Medications as of 10/16/2023  Medication Sig   Calcium Carb-Cholecalciferol 600-800  MG-UNIT TABS Take 1 tablet by mouth in the morning and at bedtime.   cyanocobalamin  1000 MCG tablet Take 1,000 mcg by mouth daily.   ezetimibe  (ZETIA ) 10 MG tablet Take 1 tablet by mouth once daily   omega-3 acid ethyl esters (LOVAZA) 1 g capsule Take by mouth 2 (two) times daily.   simvastatin  (ZOCOR ) 40 MG tablet Take 1 tablet (40 mg total) by mouth daily.   Vitamin A 2400 MCG (8000 UT) TABS Take 8,000 Units by mouth daily.   Vitamin E 400 units TABS Take 400 Units by mouth daily.   meclizine  (ANTIVERT ) 25 MG tablet Take 1 tablet (25 mg total) by mouth 3 (three) times daily as needed for dizziness. (Patient not taking: Reported on 10/16/2023)   [DISCONTINUED] oxyCODONE -acetaminophen  (PERCOCET/ROXICET) 5-325 MG tablet Take 1 tablet by mouth every 6 (six) hours as needed for severe pain. (Patient not taking: Reported on 10/16/2023)   No facility-administered encounter medications on file as of 10/16/2023.    Allergies (verified) Sulfa antibiotics   History: Past Medical History:  Diagnosis Date   High iron content of liver determined by magnetic resonance imaging 08/02/2021   Hyperlipemia    Past Surgical History:  Procedure Laterality Date   BREAST SURGERY     CATARACT EXTRACTION W/PHACO Left 08/11/2019   Procedure: CATARACT EXTRACTION PHACO AND INTRAOCULAR LENS PLACEMENT (IOC) (CDE: 9.68);  Surgeon: Tarri Farm, MD;  Location: [facility];  Service: Ophthalmology;  Laterality: Left;   CATARACT EXTRACTION W/PHACO Right 09/29/2019   Procedure: CATARACT  EXTRACTION PHACO AND INTRAOCULAR LENS PLACEMENT RIGHT EYE;  Surgeon: Tarri Farm, MD;  Location: [facility];  Service: Ophthalmology;  Laterality: Right;  CDE: 10.60   DILITATION & CURRETTAGE/HYSTROSCOPY WITH ESSURE     History reviewed. No pertinent family history. Social History   Socioeconomic History   Marital status: Married    Spouse name: Not on file   Number of children: 1   Years of education: Not on file   Highest education  level: Not on file  Occupational History   Not on file  Tobacco Use   Smoking status: Never   Smokeless tobacco: Never  Substance and Sexual Activity   Alcohol use: Never   Drug use: Never   Sexual activity: Not on file  Other Topics Concern   Not on file  Social History Narrative   Lives with husband of 49 years   They have one child and one grandchild      She enjoys reading romance novels      Diet: all food groups   Caffeine: Limited   Water: 1/2 gallon or more a day      Wears seat belt   Does not use phone while driving   Psychologist, sport and exercise at home   Chartered certified accountant safe box    Social Drivers of Health   Financial Resource Strain: Low Risk  (10/16/2023)   Overall Financial Resource Strain (CARDIA)    Difficulty of Paying Living Expenses: Not hard at all  Food Insecurity: No Food Insecurity (10/16/2023)   Hunger Vital Sign    Worried About Running Out of Food in the Last Year: Never true    Ran Out of Food in the Last Year: Never true  Transportation Needs: No Transportation Needs (10/16/2023)   PRAPARE - Administrator, Civil Service (Medical): No    Lack of Transportation (Non-Medical): No  Physical Activity: Sufficiently Active (10/16/2023)   Exercise Vital Sign    Days of Exercise per Week: 7 days    Minutes of Exercise per Session: 30 min  Stress: No Stress Concern Present (10/16/2023)   [employer] of Occupational Health - Occupational Stress Questionnaire    Feeling of Stress : Not at all  Social Connections: Moderately Integrated (10/16/2023)   Social Connection and Isolation Panel [NHANES]    Frequency of Communication with Friends and Family: More than three times a week    Frequency of Social Gatherings with Friends and Family: Once a week    Attends Religious Services: More than 4 times per year    Active Member of [employer] or Organizations: No    Attends Engineer, structural: Never    Marital Status: Married     Tobacco Counseling Counseling given: Yes    Clinical Intake:  Pre-visit preparation completed: Yes  Pain : No/denies pain     BMI - recorded: 25.89 Nutritional Status: BMI 25 -29 Overweight Nutritional Risks: None Diabetes: No  How often do you need to have someone help you when you read instructions, pamphlets, or other written materials from your doctor or pharmacy?: 1 - Never  Interpreter Needed?: No  Information entered by :: Sally Crazier CMA   Activities of Daily Living     10/16/2023   10:45 AM  In your present state of health, do you have any difficulty performing the following activities:  Hearing? 0  Vision? 0  Difficulty concentrating or making decisions? 0  Walking or climbing stairs? 0  Dressing  or bathing? 0  Doing errands, shopping? 0  Preparing Food and eating ? N  Using the Toilet? N  In the past six months, have you accidently leaked urine? N  Do you have problems with loss of bowel control? N  Managing your Medications? N  Managing your Finances? N  Housekeeping or managing your Housekeeping? N    Patient Care Team: Tobi Fortes, MD as PCP - General (Internal Medicine) Paulett Boros, MD as Medical Oncologist (Hematology)  Indicate any recent Medical Services you may have received from other than [facility] providers in the past year (date may be approximate).     Assessment:    This is a routine wellness examination for Maleny.  Hearing/Vision screen Hearing Screening - Comments:: Patient denies any hearing difficulties.   Vision Screening - Comments:: Wears rx glasses - up to date with routine eye exams  Patient sees Dr. Lucendia Rusk w/ [facility] office.     Goals Addressed               This Visit's Progress     Patient Stated (pt-stated)        I'm going to go on a vacation and go to a family reunion.        Depression Screen     10/16/2023   10:52 AM 03/29/2023    8:13 AM 10/09/2022   10:29 AM  10/09/2022   10:28 AM 09/26/2022   11:27 AM 03/10/2022   11:08 AM 10/03/2021   10:18 AM  PHQ 2/9 Scores  PHQ - 2 Score 0 0 0 0 0 0 0  PHQ- 9 Score 0          Fall Risk     10/16/2023   10:46 AM 03/29/2023    8:13 AM 10/09/2022   10:29 AM 09/26/2022   11:27 AM 03/10/2022   11:08 AM  Fall Risk   Falls in the past year? 1 1 0 0 0  Number falls in past yr: 0 0 0 0 0  Injury with Fall? 1 1 0 0 0  Risk for fall due to : History of fall(s);Orthopedic patient History of fall(s) No Fall Risks  No Fall Risks  Follow up Falls prevention discussed;Falls evaluation completed;Education provided Falls evaluation completed Falls evaluation completed  Falls evaluation completed    MEDICARE RISK AT HOME:  Medicare Risk at Home Any stairs in or around the home?: No If so, are there any without handrails?: No Home free of loose throw rugs in walkways, pet beds, electrical cords, etc?: Yes Adequate lighting in your home to reduce risk of falls?: Yes Life alert?: No Use of a cane, walker or w/c?: No Grab bars in the bathroom?: No Shower chair or bench in shower?: No Elevated toilet seat or a handicapped toilet?: No  TIMED UP AND GO:  Was the test performed?  No  Cognitive Function: 6CIT completed    10/09/2022   10:29 AM 10/03/2021   10:18 AM  MMSE - Mini Mental State Exam  Not completed: Unable to complete Unable to complete        10/16/2023   10:48 AM 10/09/2022   10:30 AM 10/03/2021   10:18 AM 09/29/2020   11:13 AM  6CIT Screen  What Year? 0 points 0 points 0 points 0 points  What month? 0 points 0 points 0 points 0 points  What time? 0 points 0 points 0 points 0 points  Count back from 20 0  points 0 points 0 points 0 points  Months in reverse 0 points 0 points 0 points 0 points  Repeat phrase 0 points 0 points 0 points 0 points  Total Score 0 points 0 points 0 points 0 points    Immunizations Immunization History  Administered Date(s) Administered   PNEUMOCOCCAL CONJUGATE-20 03/10/2022     Screening Tests Health Maintenance  Topic Date Due   Zoster Vaccines- Shingrix (1 of 2) Never done   DTaP/Tdap/Td (1 - Tdap) 03/28/2024 (Originally 07/29/1964)   INFLUENZA VACCINE  01/04/2024   Medicare Annual Wellness (AWV)  10/15/2024   Fecal DNA (Cologuard)  03/21/2025   Pneumonia Vaccine 32+ Years old  Completed   DEXA SCAN  Completed   Hepatitis C Screening  Completed   HPV VACCINES  Aged Out   Meningococcal B Vaccine  Aged Out   COVID-19 Vaccine  Discontinued    Health Maintenance  Health Maintenance Due  Topic Date Due   Zoster Vaccines- Shingrix (1 of 2) Never done   Health Maintenance Items Addressed: Patient declined bone density screening   Additional Screening:  Vision Screening: Recommended annual ophthalmology exams for early detection of glaucoma and other disorders of the eye.  Dental Screening: Recommended annual dental exams for proper oral hygiene  Community Resource Referral / Chronic Care Management: CRR required this visit?  No   CCM required this visit?  No     Plan:     I have personally reviewed and noted the following in the patient's chart:   Medical and social history Use of alcohol, tobacco or illicit drugs  Current medications and supplements including opioid prescriptions. Patient is not currently taking opioid prescriptions. Functional ability and status Nutritional status Physical activity Advanced directives List of other physicians Hospitalizations, surgeries, and ER visits in previous 12 months Vitals Screenings to include cognitive, depression, and falls Referrals and appointments  In addition, I have reviewed and discussed with patient certain preventive protocols, quality metrics, and best practice recommendations. A written personalized care plan for preventive services as well as general preventive health recommendations were provided to patient.     Torion Hulgan, CMA   10/16/2023   After Visit Summary: (Mail)  Due to this being a telephonic visit, the after visit summary with patients personalized plan was offered to patient via mail   Notes: Please refer to Routing Comments.

## 2023-10-26 ENCOUNTER — Encounter: Payer: Self-pay | Admitting: Internal Medicine

## 2023-10-26 ENCOUNTER — Ambulatory Visit (INDEPENDENT_AMBULATORY_CARE_PROVIDER_SITE_OTHER): Admitting: Internal Medicine

## 2023-10-26 VITALS — BP 118/62 | HR 81 | Ht 61.0 in | Wt 143.2 lb

## 2023-10-26 DIAGNOSIS — R7989 Other specified abnormal findings of blood chemistry: Secondary | ICD-10-CM

## 2023-10-26 DIAGNOSIS — Z6826 Body mass index (BMI) 26.0-26.9, adult: Secondary | ICD-10-CM

## 2023-10-26 DIAGNOSIS — E663 Overweight: Secondary | ICD-10-CM

## 2023-10-26 DIAGNOSIS — E7841 Elevated Lipoprotein(a): Secondary | ICD-10-CM

## 2023-10-26 DIAGNOSIS — R42 Dizziness and giddiness: Secondary | ICD-10-CM

## 2023-10-26 DIAGNOSIS — E559 Vitamin D deficiency, unspecified: Secondary | ICD-10-CM | POA: Diagnosis not present

## 2023-10-26 MED ORDER — MECLIZINE HCL 25 MG PO TABS
25.0000 mg | ORAL_TABLET | Freq: Three times a day (TID) | ORAL | 2 refills | Status: AC | PRN
Start: 2023-10-26 — End: ?

## 2023-10-26 NOTE — Patient Instructions (Signed)
 It was a pleasure to see you today.  Thank you for giving [place] the opportunity to be involved in your care.  Below is a brief recap of your visit and next steps.  We will plan to see you again in 6 months.  Summary No medication changes today Repeat labs ordered Follow up in 6 months

## 2023-10-26 NOTE — Assessment & Plan Note (Signed)
 Lipid panel updated in April 2024.  Total cholesterol 176, LDL 89, and triglycerides 696.  She remains on simvastatin  40 mg daily, Zetia  10 mg daily, and Lovaza 1 g twice daily.  Repeat lipid panel ordered today.

## 2023-10-26 NOTE — Assessment & Plan Note (Signed)
 Seen by hematology for follow-up in late March.  No indication for additional phlebotomy.  She will return to care for follow-up in late July.

## 2023-10-26 NOTE — Progress Notes (Signed)
 Established Patient Office Visit  Subjective   Patient ID: Stephanie Ramos, female    DOB: 1946-04-18  Age: 78 y.o. MRN: [id number]  Chief Complaint  Patient presents with   Hyperlipidemia   Stephanie Ramos returns to care today for routine follow-up.  She was last evaluated by me in October 2024.  No medication changes were made at that time and 14-month follow-up was arranged.  In the interim, she has attended physical therapy and has seen orthopedic surgery for follow-up in the setting of a right proximal humerus fracture.  She has also been seen by oncology for follow-up in the setting of elevated ferritin levels, 3/28 most recently.  There have otherwise been no acute interval events.  Today she reports feeling well and has no acute concerns to discuss.  Past Medical History:  Diagnosis Date   High iron content of liver determined by magnetic resonance imaging 08/02/2021   Hyperlipemia    Past Surgical History:  Procedure Laterality Date   BREAST SURGERY     CATARACT EXTRACTION W/PHACO Left 08/11/2019   Procedure: CATARACT EXTRACTION PHACO AND INTRAOCULAR LENS PLACEMENT (IOC) (CDE: 9.68);  Surgeon: Stephanie Farm, MD;  Location: [facility];  Service: Ophthalmology;  Laterality: Left;   CATARACT EXTRACTION W/PHACO Right 09/29/2019   Procedure: CATARACT EXTRACTION PHACO AND INTRAOCULAR LENS PLACEMENT RIGHT EYE;  Surgeon: Stephanie Farm, MD;  Location: [facility];  Service: Ophthalmology;  Laterality: Right;  CDE: 10.60   DILITATION & CURRETTAGE/HYSTROSCOPY WITH ESSURE     Social History   Tobacco Use   Smoking status: Never   Smokeless tobacco: Never  Substance Use Topics   Alcohol use: Never   Drug use: Never   History reviewed. No pertinent family history. Allergies  Allergen Reactions   Sulfa Antibiotics Rash   Review of Systems  Constitutional:  Negative for chills and fever.  HENT:  Negative for sore throat.   Respiratory:  Negative for cough and shortness of breath.    Cardiovascular:  Negative for chest pain, palpitations and leg swelling.  Gastrointestinal:  Negative for abdominal pain, blood in stool, constipation, diarrhea, nausea and vomiting.  Genitourinary:  Negative for dysuria and hematuria.  Musculoskeletal:  Negative for myalgias.  Skin:  Negative for itching and rash.  Neurological:  Negative for dizziness and headaches.  Psychiatric/Behavioral:  Negative for depression and suicidal ideas.      Objective:     BP 118/62   Pulse 81   Ht 5\' 1"  (1.549 m)   Wt 143 lb 4 oz (65 kg)   SpO2 98%   BMI 27.07 kg/m  BP Readings from Last 3 Encounters:  10/26/23 118/62  08/31/23 138/84  05/01/23 120/85   Physical Exam Vitals reviewed.  Constitutional:      General: She is not in acute distress.    Appearance: Normal appearance. She is not toxic-appearing.  HENT:     Head: Normocephalic and atraumatic.     Right Ear: External ear normal.     Left Ear: External ear normal.     Nose: Nose normal. No congestion or rhinorrhea.     Mouth/Throat:     Mouth: Mucous membranes are moist.     Pharynx: Oropharynx is clear. No oropharyngeal exudate or posterior oropharyngeal erythema.  Eyes:     General: No scleral icterus.    Extraocular Movements: Extraocular movements intact.     Conjunctiva/sclera: Conjunctivae normal.     Pupils: Pupils are equal, round, and reactive to light.  Cardiovascular:  Rate and Rhythm: Normal rate and regular rhythm.     Pulses: Normal pulses.     Heart sounds: Normal heart sounds. No murmur heard.    No friction rub. No gallop.  Pulmonary:     Effort: Pulmonary effort is normal.     Breath sounds: Normal breath sounds. No wheezing, rhonchi or rales.  Abdominal:     General: Abdomen is flat. Bowel sounds are normal. There is no distension.     Palpations: Abdomen is soft.     Tenderness: There is no abdominal tenderness.  Musculoskeletal:        General: No swelling.     Cervical back: Normal range of  motion.     Right lower leg: No edema.     Left lower leg: No edema.     Comments: Reduced ROM of right shoulder  Lymphadenopathy:     Cervical: No cervical adenopathy.  Skin:    General: Skin is warm and dry.     Capillary Refill: Capillary refill takes less than 2 seconds.     Coloration: Skin is not jaundiced.  Neurological:     General: No focal deficit present.     Mental Status: She is alert and oriented to person, place, and time.  Psychiatric:        Mood and Affect: Mood normal.        Behavior: Behavior normal.   Last CBC Lab Results  Component Value Date   WBC 6.3 08/24/2023   HGB 14.1 08/24/2023   HCT 44.8 08/24/2023   MCV 95.3 08/24/2023   MCH 30.0 08/24/2023   RDW 12.8 08/24/2023   PLT 358 08/24/2023   Last metabolic panel Lab Results  Component Value Date   GLUCOSE 97 01/23/2022   NA 141 01/23/2022   K 4.1 01/23/2022   CL 106 01/23/2022   CO2 29 01/23/2022   BUN 13 01/23/2022   CREATININE 0.60 01/23/2022   GFRNONAA >60 01/23/2022   CALCIUM 9.4 01/23/2022   PROT 7.2 12/22/2022   ALBUMIN 4.0 12/22/2022   LABGLOB 2.7 07/19/2021   AGRATIO 1.7 07/19/2021   BILITOT 0.6 12/22/2022   ALKPHOS 78 12/22/2022   AST 35 12/22/2022   ALT 40 12/22/2022   ANIONGAP 6 01/23/2022   Last lipids Lab Results  Component Value Date   CHOL 176 09/27/2022   HDL 69 09/27/2022   LDLCALC 89 09/27/2022   TRIG 100 09/27/2022   CHOLHDL 2.6 09/27/2022   Last hemoglobin A1c Lab Results  Component Value Date   HGBA1C 5.5 12/28/2017   Last thyroid  functions Lab Results  Component Value Date   TSH 2.770 09/27/2022   Last vitamin D  Lab Results  Component Value Date   VD25OH 38.10 01/23/2022   Last vitamin B12 and Folate Lab Results  Component Value Date   VITAMINB12 1,441 (H) 09/27/2022   FOLATE 15.3 09/27/2022   The 10-year ASCVD risk score (Arnett DK, et al., 2019) is: 17.4%    Assessment & Plan:   Problem List Items Addressed This Visit        Hyperlipemia - Primary (Chronic)   Lipid panel updated in April 2024.  Total cholesterol 176, LDL 89, and triglycerides 161.  She remains on simvastatin  40 mg daily, Zetia  10 mg daily, and Lovaza 1 g twice daily.  Repeat lipid panel ordered today.      Vertigo   Meclizine  refilled for as needed symptom relief      Elevated ferritin level   Seen  by hematology for follow-up in late March.  No indication for additional phlebotomy.  She will return to care for follow-up in late July.      Return in about 6 months (around 04/27/2024).   Tobi Fortes, MD

## 2023-10-26 NOTE — Assessment & Plan Note (Signed)
 Meclizine  refilled for as needed symptom relief

## 2023-10-27 LAB — TSH+FREE T4
Free T4: 1.17 ng/dL (ref 0.82–1.77)
TSH: 2.97 u[IU]/mL (ref 0.450–4.500)

## 2023-10-27 LAB — CMP14+EGFR
ALT: 20 IU/L (ref 0–32)
AST: 24 IU/L (ref 0–40)
Albumin: 4.7 g/dL (ref 3.8–4.8)
Alkaline Phosphatase: 93 IU/L (ref 44–121)
BUN/Creatinine Ratio: 21 (ref 12–28)
BUN: 14 mg/dL (ref 8–27)
Bilirubin Total: 0.3 mg/dL (ref 0.0–1.2)
CO2: 22 mmol/L (ref 20–29)
Calcium: 9.7 mg/dL (ref 8.7–10.3)
Chloride: 103 mmol/L (ref 96–106)
Creatinine, Ser: 0.67 mg/dL (ref 0.57–1.00)
Globulin, Total: 2 g/dL (ref 1.5–4.5)
Glucose: 87 mg/dL (ref 70–99)
Potassium: 4.5 mmol/L (ref 3.5–5.2)
Sodium: 140 mmol/L (ref 134–144)
Total Protein: 6.7 g/dL (ref 6.0–8.5)
eGFR: 90 mL/min/{1.73_m2} (ref >59)

## 2023-10-27 LAB — LIPID PANEL
Chol/HDL Ratio: 2.4 ratio (ref 0.0–4.4)
Cholesterol, Total: 144 mg/dL (ref 100–199)
HDL: 59 mg/dL (ref >39)
LDL Chol Calc (NIH): 67 mg/dL (ref 0–99)
Triglycerides: 99 mg/dL (ref 0–149)
VLDL Cholesterol Cal: 18 mg/dL (ref 5–40)

## 2023-10-27 LAB — CBC WITH DIFFERENTIAL/PLATELET
Basophils Absolute: 0 10*3/uL (ref 0.0–0.2)
Basos: 1 %
EOS (ABSOLUTE): 0.1 10*3/uL (ref 0.0–0.4)
Eos: 1 %
Hematocrit: 42.7 % (ref 34.0–46.6)
Hemoglobin: 13.8 g/dL (ref 11.1–15.9)
Immature Grans (Abs): 0 10*3/uL (ref 0.0–0.1)
Immature Granulocytes: 0 %
Lymphocytes Absolute: 2.3 10*3/uL (ref 0.7–3.1)
Lymphs: 34 %
MCH: 30.6 pg (ref 26.6–33.0)
MCHC: 32.3 g/dL (ref 31.5–35.7)
MCV: 95 fL (ref 79–97)
Monocytes Absolute: 0.6 10*3/uL (ref 0.1–0.9)
Monocytes: 9 %
Neutrophils Absolute: 3.6 10*3/uL (ref 1.4–7.0)
Neutrophils: 55 %
Platelets: 391 10*3/uL (ref 150–450)
RBC: 4.51 x10E6/uL (ref 3.77–5.28)
RDW: 12.5 % (ref 11.7–15.4)
WBC: 6.7 10*3/uL (ref 3.4–10.8)

## 2023-10-27 LAB — B12 AND FOLATE PANEL
Folate: 16.4 ng/mL (ref >3.0)
Vitamin B-12: 1412 pg/mL — ABNORMAL HIGH (ref 232–1245)

## 2023-10-27 LAB — VITAMIN D 25 HYDROXY (VIT D DEFICIENCY, FRACTURES): Vit D, 25-Hydroxy: 37.6 ng/mL (ref 30.0–100.0)

## 2023-10-27 LAB — HEMOGLOBIN A1C
Est. average glucose Bld gHb Est-mCnc: 114 mg/dL
Hgb A1c MFr Bld: 5.6 % (ref 4.8–5.6)

## 2023-10-29 ENCOUNTER — Ambulatory Visit: Payer: Self-pay | Admitting: Internal Medicine

## 2023-12-28 ENCOUNTER — Inpatient Hospital Stay: Attending: Hematology

## 2023-12-28 DIAGNOSIS — R7989 Other specified abnormal findings of blood chemistry: Secondary | ICD-10-CM | POA: Insufficient documentation

## 2023-12-28 LAB — CBC WITH DIFFERENTIAL/PLATELET
Abs Immature Granulocytes: 0.02 K/uL (ref 0.00–0.07)
Basophils Absolute: 0.1 K/uL (ref 0.0–0.1)
Basophils Relative: 1 %
Eosinophils Absolute: 0.1 K/uL (ref 0.0–0.5)
Eosinophils Relative: 1 %
HCT: 44 % (ref 36.0–46.0)
Hemoglobin: 14.2 g/dL (ref 12.0–15.0)
Immature Granulocytes: 0 %
Lymphocytes Relative: 32 %
Lymphs Abs: 1.7 K/uL (ref 0.7–4.0)
MCH: 31.2 pg (ref 26.0–34.0)
MCHC: 32.3 g/dL (ref 30.0–36.0)
MCV: 96.7 fL (ref 80.0–100.0)
Monocytes Absolute: 0.5 K/uL (ref 0.1–1.0)
Monocytes Relative: 9 %
Neutro Abs: 3.1 K/uL (ref 1.7–7.7)
Neutrophils Relative %: 57 %
Platelets: 350 K/uL (ref 150–400)
RBC: 4.55 MIL/uL (ref 3.87–5.11)
RDW: 12.2 % (ref 11.5–15.5)
WBC: 5.4 K/uL (ref 4.0–10.5)
nRBC: 0 % (ref 0.0–0.2)

## 2023-12-28 LAB — IRON AND TIBC
Iron: 69 ug/dL (ref 28–170)
Saturation Ratios: 18 % (ref 10.4–31.8)
TIBC: 395 ug/dL (ref 250–450)
UIBC: 326 ug/dL

## 2023-12-28 LAB — FERRITIN: Ferritin: 49 ng/mL (ref 11–307)

## 2023-12-31 ENCOUNTER — Other Ambulatory Visit: Payer: Self-pay | Admitting: Internal Medicine

## 2023-12-31 DIAGNOSIS — E7841 Elevated Lipoprotein(a): Secondary | ICD-10-CM

## 2024-01-04 ENCOUNTER — Inpatient Hospital Stay: Attending: Hematology | Admitting: Oncology

## 2024-01-04 DIAGNOSIS — R7989 Other specified abnormal findings of blood chemistry: Secondary | ICD-10-CM | POA: Diagnosis not present

## 2024-01-04 NOTE — Assessment & Plan Note (Addendum)
-   Her last phlebotomy was on 05/01/2023 for ferritin of 119. - Since November 2020 for her ferritin has remained less than 100. -She does not need any additional phlebotomies at this time. -Follow-up in 6 months with labs a few days before.

## 2024-01-04 NOTE — Progress Notes (Addendum)
    [name] OFFICE PROGRESS NOTE  I connected with Stephanie Ramos Server on 01/04/24 at  1:15 PM EDT by telephone visit and verified that I am speaking with the correct person using two identifiers.   I discussed the limitations, risks, security and privacy concerns of performing an evaluation and management service by telemedicine and the availability of in-person appointments. I also discussed with the patient that there may be a patient responsible charge related to this service. The patient expressed understanding and agreed to proceed.   Other persons participating in the visit and their role in the encounter: NP, Patient   Patient's location: Home Provider's location: Clinic   Stephanie Manus BRAVO, MD   ASSESSMENT & PLAN:  Assessment & Plan Elevated ferritin level - Her last phlebotomy was on 05/01/2023 for ferritin of 119. - Since November 2020 for her ferritin has remained less than 100. -She does not need any additional phlebotomies at this time. -Follow-up in 6 months with labs a few days before.    Orders Placed This Encounter  Procedures   CBC with Differential    Standing Status:   Future    Expected Date:   07/06/2024    Expiration Date:   10/04/2024   Ferritin    Standing Status:   Future    Expected Date:   07/06/2024    Expiration Date:   10/04/2024   Iron and TIBC ([facility] ONLY)    Standing Status:   Future    Expected Date:   07/06/2024    Expiration Date:   10/04/2024    INTERVAL HISTORY: Patient returns today for follow-up.  Her last phlebotomy was on 05/01/2023.  Reports she has done well the last 4 months.  Appetite is 100% energy levels are 75%.  She denies any fevers, night sweats or weight loss.  No recent infections.  SUMMARY OF HEMATOLOGIC HISTORY: Patient was initially diagnosed with an elevated ferritin  Due to signs of hepatic and reticuloendothelial iron deposition, she underwent phlebotomy therapy to reach goal of  ferritin <100 in November 2023 and 2024.  Last ferritin was 119.  Tolerated phlebotomy well  Patient was on iron tablet daily for many years, stopped in August 2022 when her ferritin was found to be elevated at 1609 with percent saturation 33. She denies any prior history of transfusion or parenteral iron therapy. Hemochromatosis workup: Negative. MRI liver (07/27/2021): Hepatic iron deposition.  No results found for: CBC  There were no vitals filed for this visit. Review of Systems  Constitutional:  Negative for malaise/fatigue.   Physical Exam Neurological:     Mental Status: She is alert and oriented to person, place, and time.   I provided 12 minutes of non face-to-face telephone visit time during this encounter, and > 50% was spent counseling as documented under my assessment & plan.   [name], NP 01/04/2024 12:24 PM

## 2024-04-25 ENCOUNTER — Ambulatory Visit

## 2024-04-25 VITALS — BP 116/74 | HR 66 | Ht 61.0 in | Wt 141.0 lb

## 2024-04-25 DIAGNOSIS — Z6826 Body mass index (BMI) 26.0-26.9, adult: Secondary | ICD-10-CM | POA: Diagnosis not present

## 2024-04-25 DIAGNOSIS — E611 Iron deficiency: Secondary | ICD-10-CM | POA: Diagnosis not present

## 2024-04-25 DIAGNOSIS — E663 Overweight: Secondary | ICD-10-CM

## 2024-04-25 DIAGNOSIS — E7841 Elevated Lipoprotein(a): Secondary | ICD-10-CM

## 2024-04-25 DIAGNOSIS — E785 Hyperlipidemia, unspecified: Secondary | ICD-10-CM

## 2024-04-25 NOTE — Progress Notes (Unsigned)
 Established Patient Office Visit  Subjective   Patient ID: Stephanie Ramos, female    DOB: [birth year]  Age: 78 y.o. MRN: [id number]  Chief Complaint  Patient presents with   Schedule Bone Density   Medical Management of Chronic Issues    Pt here for a follow up    HPI Discussed the use of AI scribe software for clinical note transcription with the patient, who gave verbal consent to proceed.  History of Present Illness    Stephanie Ramos is a 78 year old female who presents for a six-month follow-up visit.  General health status - Feels well with no current concerns or issues.  Supplement use and laboratory findings - Taking vitamin D  and calcium supplements. - Reduced calcium intake after previously elevated calcium levels were identified.     Patient Active Problem List   Diagnosis Date Noted   Comminuted fracture of right humerus 03/29/2023   Preventative health care 03/10/2022   Hypercalcemia 09/08/2021   High iron content of liver determined by magnetic resonance imaging 08/02/2021   Hyperkalemia 07/21/2021   Elevated ferritin level 06/24/2021   Fatigue 05/20/2021   Iron deficiency 01/18/2021   Elevated BP without diagnosis of hypertension 01/18/2021   Overweight with body mass index (BMI) of 26 to 26.9 in adult 03/18/2020   Refused influenza vaccine 03/18/2020   Vertigo 12/28/2017   Hyperlipemia 12/28/2017    ROS    Objective:     BP 116/74 (BP Location: Left Arm, Patient Position: Sitting, Cuff Size: Normal)   Pulse 66   Ht 5' 1 (1.549 m)   Wt 141 lb (64 kg)   SpO2 98%   BMI 26.64 kg/m  BP Readings from Last 3 Encounters:  04/25/24 116/74  10/26/23 118/62  08/31/23 138/84   Wt Readings from Last 3 Encounters:  04/25/24 141 lb (64 kg)  10/26/23 143 lb 4 oz (65 kg)  10/16/23 137 lb (62.1 kg)     Physical Exam Vitals and nursing note reviewed.  Constitutional:      Appearance: Normal appearance.  HENT:     Head: Normocephalic.   Eyes:     Extraocular Movements: Extraocular movements intact.     Pupils: Pupils are equal, round, and reactive to light.  Cardiovascular:     Rate and Rhythm: Normal rate and regular rhythm.  Pulmonary:     Effort: Pulmonary effort is normal.     Breath sounds: Normal breath sounds.  Musculoskeletal:     Cervical back: Normal range of motion and neck supple.  Neurological:     Mental Status: She is alert and oriented to person, place, and time.  Psychiatric:        Mood and Affect: Mood normal.        Thought Content: Thought content normal.      Last CBC Lab Results  Component Value Date   WBC 6.6 04/25/2024   HGB 14.6 04/25/2024   HCT 44.5 04/25/2024   MCV 96 04/25/2024   MCH 31.5 04/25/2024   RDW 12.0 04/25/2024   PLT 403 04/25/2024   Last metabolic panel Lab Results  Component Value Date   GLUCOSE 94 04/25/2024   NA 141 04/25/2024   K 4.6 04/25/2024   CL 101 04/25/2024   CO2 24 04/25/2024   BUN 16 04/25/2024   CREATININE 0.72 04/25/2024   EGFR 86 04/25/2024   CALCIUM 9.8 04/25/2024   PROT 7.2 04/25/2024   ALBUMIN 4.6 04/25/2024   LABGLOB 2.6  04/25/2024   AGRATIO 1.7 07/19/2021   BILITOT 0.4 04/25/2024   ALKPHOS 95 04/25/2024   AST 22 04/25/2024   ALT 21 04/25/2024   ANIONGAP 6 01/23/2022   Last lipids Lab Results  Component Value Date   CHOL 152 04/25/2024   HDL 62 04/25/2024   LDLCALC 74 04/25/2024   TRIG 88 04/25/2024   CHOLHDL 2.5 04/25/2024   Last hemoglobin A1c Lab Results  Component Value Date   HGBA1C 5.6 10/26/2023   Last thyroid  functions Lab Results  Component Value Date   TSH 1.680 04/25/2024   FREET4 1.28 04/25/2024   Last vitamin D  Lab Results  Component Value Date   VD25OH 37.6 10/26/2023   Last vitamin B12 and Folate Lab Results  Component Value Date   VITAMINB12 1,412 (H) 10/26/2023   FOLATE 16.4 10/26/2023      The 10-year ASCVD risk score (Arnett DK, et al., 2019) is: 16.7%    Assessment & Plan:    Problem List Items Addressed This Visit       Other   Hyperlipemia (Chronic)   She remains on simvastatin  40 mg daily, Zetia  10 mg daily, and Lovaza 1 g twice daily.  Repeat lipid panel ordered today.      Relevant Orders   CMP14+EGFR (Completed)   Lipid panel (Completed)   TSH + free T4 (Completed)   Overweight with body mass index (BMI) of 26 to 26.9 in adult - Primary   Encouraged heart healthy diet and exercise       Iron deficiency   -at a previous OV, she had been taking iron supplementation and her ferritin was well above normal limits, so we stopped the iron supplement Recheck iron levels today.       Relevant Orders   CBC with Differential/Platelet (Completed)   Return in about 6 months (around 10/23/2024) for chronic follow-up with PCP.    Leita Longs, FNP

## 2024-04-26 LAB — CBC WITH DIFFERENTIAL/PLATELET
Basophils Absolute: 0 x10E3/uL (ref 0.0–0.2)
Basos: 1 %
EOS (ABSOLUTE): 0.1 x10E3/uL (ref 0.0–0.4)
Eos: 1 %
Hematocrit: 44.5 % (ref 34.0–46.6)
Hemoglobin: 14.6 g/dL (ref 11.1–15.9)
Immature Grans (Abs): 0 x10E3/uL (ref 0.0–0.1)
Immature Granulocytes: 0 %
Lymphocytes Absolute: 2.1 x10E3/uL (ref 0.7–3.1)
Lymphs: 32 %
MCH: 31.5 pg (ref 26.6–33.0)
MCHC: 32.8 g/dL (ref 31.5–35.7)
MCV: 96 fL (ref 79–97)
Monocytes Absolute: 0.5 x10E3/uL (ref 0.1–0.9)
Monocytes: 8 %
Neutrophils Absolute: 3.8 x10E3/uL (ref 1.4–7.0)
Neutrophils: 58 %
Platelets: 403 x10E3/uL (ref 150–450)
RBC: 4.63 x10E6/uL (ref 3.77–5.28)
RDW: 12 % (ref 11.7–15.4)
WBC: 6.6 x10E3/uL (ref 3.4–10.8)

## 2024-04-26 LAB — CMP14+EGFR
ALT: 21 IU/L (ref 0–32)
AST: 22 IU/L (ref 0–40)
Albumin: 4.6 g/dL (ref 3.8–4.8)
Alkaline Phosphatase: 95 IU/L (ref 49–135)
BUN/Creatinine Ratio: 22 (ref 12–28)
BUN: 16 mg/dL (ref 8–27)
Bilirubin Total: 0.4 mg/dL (ref 0.0–1.2)
CO2: 24 mmol/L (ref 20–29)
Calcium: 9.8 mg/dL (ref 8.7–10.3)
Chloride: 101 mmol/L (ref 96–106)
Creatinine, Ser: 0.72 mg/dL (ref 0.57–1.00)
Globulin, Total: 2.6 g/dL (ref 1.5–4.5)
Glucose: 94 mg/dL (ref 70–99)
Potassium: 4.6 mmol/L (ref 3.5–5.2)
Sodium: 141 mmol/L (ref 134–144)
Total Protein: 7.2 g/dL (ref 6.0–8.5)
eGFR: 86 mL/min/1.73 (ref >59)

## 2024-04-26 LAB — LIPID PANEL
Chol/HDL Ratio: 2.5 ratio (ref 0.0–4.4)
Cholesterol, Total: 152 mg/dL (ref 100–199)
HDL: 62 mg/dL (ref >39)
LDL Chol Calc (NIH): 74 mg/dL (ref 0–99)
Triglycerides: 88 mg/dL (ref 0–149)
VLDL Cholesterol Cal: 16 mg/dL (ref 5–40)

## 2024-04-26 LAB — TSH+FREE T4
Free T4: 1.28 ng/dL (ref 0.82–1.77)
TSH: 1.68 u[IU]/mL (ref 0.450–4.500)

## 2024-04-28 NOTE — Assessment & Plan Note (Signed)
-  at a previous OV, she had been taking iron supplementation and her ferritin was well above normal limits, so we stopped the iron supplement Recheck iron levels today.

## 2024-04-28 NOTE — Assessment & Plan Note (Signed)
Encouraged heart healthy diet and exercise  

## 2024-04-28 NOTE — Assessment & Plan Note (Signed)
 She remains on simvastatin  40 mg daily, Zetia  10 mg daily, and Lovaza 1 g twice daily.  Repeat lipid panel ordered today.

## 2024-06-26 ENCOUNTER — Other Ambulatory Visit: Payer: Self-pay

## 2024-06-26 DIAGNOSIS — E7841 Elevated Lipoprotein(a): Secondary | ICD-10-CM

## 2024-07-14 ENCOUNTER — Inpatient Hospital Stay

## 2024-07-17 ENCOUNTER — Inpatient Hospital Stay: Admitting: Oncology

## 2024-10-20 ENCOUNTER — Ambulatory Visit

## 2024-10-24 ENCOUNTER — Ambulatory Visit
